# Patient Record
Sex: Male | Born: 1970 | Race: White | Hispanic: No | Marital: Married | Smoking: Current some day smoker
Health system: Southern US, Community
[De-identification: ages and names within clinical notes are randomized; demographics above are authoritative.]

## PROBLEM LIST (undated history)

## (undated) DIAGNOSIS — Z8042 Family history of malignant neoplasm of prostate: Secondary | ICD-10-CM

## (undated) DIAGNOSIS — Z800 Family history of malignant neoplasm of digestive organs: Secondary | ICD-10-CM

## (undated) DIAGNOSIS — Z8051 Family history of malignant neoplasm of kidney: Secondary | ICD-10-CM

## (undated) DIAGNOSIS — Z803 Family history of malignant neoplasm of breast: Secondary | ICD-10-CM

## (undated) DIAGNOSIS — Z8049 Family history of malignant neoplasm of other genital organs: Secondary | ICD-10-CM

## (undated) HISTORY — DX: Family history of malignant neoplasm of breast: Z80.3

## (undated) HISTORY — DX: Family history of malignant neoplasm of other genital organs: Z80.49

## (undated) HISTORY — DX: Family history of malignant neoplasm of prostate: Z80.42

## (undated) HISTORY — DX: Family history of malignant neoplasm of kidney: Z80.51

## (undated) HISTORY — DX: Family history of malignant neoplasm of digestive organs: Z80.0

---

## 2007-08-14 ENCOUNTER — Ambulatory Visit: Payer: Self-pay | Admitting: Family Medicine

## 2007-08-14 DIAGNOSIS — F410 Panic disorder [episodic paroxysmal anxiety] without agoraphobia: Secondary | ICD-10-CM | POA: Insufficient documentation

## 2007-08-14 DIAGNOSIS — Z8719 Personal history of other diseases of the digestive system: Secondary | ICD-10-CM | POA: Insufficient documentation

## 2007-08-14 DIAGNOSIS — F17200 Nicotine dependence, unspecified, uncomplicated: Secondary | ICD-10-CM | POA: Insufficient documentation

## 2007-09-15 ENCOUNTER — Telehealth: Payer: Self-pay | Admitting: Family Medicine

## 2008-09-16 ENCOUNTER — Telehealth: Payer: Self-pay | Admitting: Family Medicine

## 2009-03-06 ENCOUNTER — Emergency Department: Payer: Self-pay | Admitting: Emergency Medicine

## 2010-07-31 NOTE — Assessment & Plan Note (Signed)
Summary: to be established/mhf   Vital Signs:  Patient Profile:   40 Years Old Male Height:     61 inches (154.94 cm) Weight:      237 pounds (107.73 kg) Temp:     98.1 degrees F (36.72 degrees C) oral Pulse rate:   74 / minute BP sitting:   140 / 80  (right arm)  Pt. in pain?   no  Vitals Entered By: Arcola Jansky, RN (August 14, 2007 2:52 PM)              Is Patient Diabetic? No     Chief Complaint:  to be est     ---1)hemmroids 1-93yrs 2) back problems 3) panic attacks.  History of Present Illness: Malik Lynch is a 40 year old, married male self-employed Surveyor, minerals who comes in today as a new patient for evaluation of multiple problems.  Problem number one is hemorrhoids.  Off-and-on for the past two to 3, years.  He's had bright red rectal bleeding.  It's painless and associated with chronic constipation.  He said no, vomiting, diarrhea, abdominal pain, or weight loss.  No family history of colon cancer.  For many, years.  He's had panic attacks.  He has a bad spell.  He only once per year.  Two to 3 times a week.  He has the sense of severe anxiety.  He would like to discuss treatment options.  He said his mother was a nervous person.  He smokes one pack of cigarettes a day.  He started smoking at age 89.  Equipped for one year, but then restarted.  He therefore has a 21 year pack history.  He would like to discuss options of smoking cessation.    Current Allergies (reviewed today): No known allergies   Past Medical History:    Reviewed history and no changes required:       fractured wrist       fractured leg       low back pain       panic attacks       tobacco abuse   Family History:    Reviewed history and no changes required:       father died at 24, COPD smoker, pneumonia, hyperlipidemia, and degenerative disease       mother 11, anxiety       3 brothers in good health  Social History:    Reviewed history and no changes required:       Occupation:  Development worker, community       Married       Current Smoker       Alcohol use-no       Drug use-no       Regular exercise-yes   Risk Factors:  Tobacco use:  current    Cigarettes:  Yes -- 1 pack(s) per day    Counseled to quit/cut down tobacco use:  yes Drug use:  no Alcohol use:  no Exercise:  yes   Review of Systems      See HPI   Physical Exam  General:     Well-developed,well-nourished,in no acute distress; alert,appropriate and cooperative throughout examination Head:     Normocephalic and atraumatic without obvious abnormalities. No apparent alopecia or balding. Eyes:     No corneal or conjunctival inflammation noted. EOMI. Perrla. Funduscopic exam benign, without hemorrhages, exudates or papilledema. Vision grossly normal. Ears:     External ear exam shows no significant lesions or deformities.  Otoscopic examination reveals  clear canals, tympanic membranes are intact bilaterally without bulging, retraction, inflammation or discharge. Hearing is grossly normal bilaterally. Nose:     External nasal examination shows no deformity or inflammation. Nasal mucosa are pink and moist without lesions or exudates. Mouth:     Oral mucosa and oropharynx without lesions or exudates.  Teeth in good repair. Abdomen:     Bowel sounds positive,abdomen soft and non-tender without masses, organomegaly or hernias noted. Rectal:     No external abnormalities noted. Normal sphincter tone. No rectal masses or tenderness. Psych:     Cognition and judgment appear intact. Alert and cooperative with normal attention span and concentration. No apparent delusions, illusions, hallucinations    Impression & Recommendations:  Problem # 1:  RECTAL BLEEDING, HX OF (ICD-V12.79) Assessment: New  Problem # 2:  PANIC DISORDER (ICD-300.01) Assessment: New  His updated medication list for this problem includes:    Celexa 20 Mg Tabs (Citalopram hydrobromide) .Marland Kitchen... 1 tab @ bedtime   Problem # 3:  TOBACCO ABUSE  (ICD-305.1) Assessment: New  His updated medication list for this problem includes:    Chantix Starting Month Pak 0.5 Mg X 11 & 1 Mg X 42 Misc (Varenicline tartrate) ..... Uad   Complete Medication List: 1)  Advil 100 Mg Tabs (Ibuprofen) .... Otc 2)  Adprin B 325 Mg Tabs (Aspirin buf(cacarb-mgcarb-mgo)) .... Otc, as needed 3)  Celexa 20 Mg Tabs (Citalopram hydrobromide) .Marland Kitchen.. 1 tab @ bedtime 4)  Anusol-hc 25 Mg Supp (Hydrocortisone acetate) .Marland Kitchen.. 1 tab @ bedtime 5)  Chantix Starting Month Pak 0.5 Mg X 11 & 1 Mg X 42 Misc (Varenicline tartrate) .... Uad   Patient Instructions: 1)  begin Celexa 20 mg one half tablet at bedtime.  If after a month if still having symptoms increase it to a full tablet. 2)  Drink at least 40 ounces of water a day.  Take a stool softener of your choice and take it on a daily basis.  Soak for 10 minutes at that time, the hot tub.  Insert, a suppository in your rectum.for the next 12 nights.  After that take a stool softener on a regular basis.  If the rectal bleeding recurs let us know.  We will evaluate further. 3)  Begin the smoking cessation program as outlined.  Return for a complete physical examination sometime in the next 4 to 6 weeks 4)  cbc, lipid panel, u/a ,tsh level, liver fcn panel,bmet, and psa prior to next visit (v70.0)     Prescriptions: CHANTIX STARTING MONTH PAK 0.5 MG X 11 & 1 MG X 42  MISC (VARENICLINE TARTRATE) UAD  #1 x 0   Entered and Authorized by:   Roderick Pee MD   Signed by:   Roderick Pee MD on 08/14/2007   Method used:   Print then Give to Patient   RxID:   [id number] ANUSOL-HC 25 MG  SUPP (HYDROCORTISONE ACETATE) 1 tab @ bedtime  #12 x 2   Entered and Authorized by:   Roderick Pee MD   Signed by:   Roderick Pee MD on 08/14/2007   Method used:   Print then Give to Patient   RxID:   [id number] CELEXA 20 MG  TABS (CITALOPRAM HYDROBROMIDE) 1 tab @ bedtime  #100 x 4   Entered and Authorized by:   Roderick Pee  MD   Signed by:   Roderick Pee MD on 08/14/2007   Method used:   Print then  Give to Patient   RxID:   [id number]  ]

## 2010-07-31 NOTE — Progress Notes (Signed)
Summary: cialopram refill-no more until office visit  Phone Note From Pharmacy   Details for Reason: refill Summary of Call: pharm is calling because the patient would like a refill of citalopram.  last office visit was 2/09 as a new patient.  is this okay to fill? Initial call taken by: Kern Reap CMA,  September 16, 2008 12:20 PM  Follow-up for Phone Call        given one-month supply having no refills.  Have him come in sometime in the next 4 weeks for an office visit Follow-up by: Roderick Pee MD,  September 16, 2008 1:50 PM  Additional Follow-up for Phone Call Additional follow up Details #1::        Prescription resent Additional Follow-up by: Kern Reap CMA,  September 16, 2008 4:44 PM

## 2010-07-31 NOTE — Progress Notes (Signed)
Summary: anxiety   Phone Note Call from Patient Call back at [phone] ext [phone]   Caller: patient wife Call For: todd Summary of Call: was given celexa for anxiety He is having ups and downs.  When he becomes angry he cant calm down and when he does he feels drained.  He is worried he is bipolar.  Is there another med he can try  Initial call taken by: Roselle Locus,  September 15, 2007 1:15 PM  Follow-up for Phone Call        10:00 Fri Dr. Tawanna Cooler.  Wife aware. Follow-up by: Rudy Jew, RN,  September 15, 2007 1:30 PM

## 2010-08-04 ENCOUNTER — Emergency Department (HOSPITAL_COMMUNITY)
Admission: EM | Admit: 2010-08-04 | Discharge: 2010-08-04 | Disposition: A | Discharge status: Home / Self Care | Payer: Self-pay | Attending: Emergency Medicine | Admitting: Emergency Medicine

## 2010-08-04 DIAGNOSIS — M545 Low back pain, unspecified: Secondary | ICD-10-CM | POA: Insufficient documentation

## 2010-08-04 DIAGNOSIS — M549 Dorsalgia, unspecified: Secondary | ICD-10-CM | POA: Insufficient documentation

## 2010-08-08 ENCOUNTER — Encounter: Payer: Self-pay | Admitting: Family Medicine

## 2010-08-08 ENCOUNTER — Ambulatory Visit (INDEPENDENT_AMBULATORY_CARE_PROVIDER_SITE_OTHER): Payer: Self-pay | Admitting: Family Medicine

## 2010-08-08 DIAGNOSIS — M545 Low back pain, unspecified: Secondary | ICD-10-CM

## 2010-08-08 DIAGNOSIS — M5136 Other intervertebral disc degeneration, lumbar region: Secondary | ICD-10-CM | POA: Insufficient documentation

## 2010-08-08 DIAGNOSIS — F17200 Nicotine dependence, unspecified, uncomplicated: Secondary | ICD-10-CM

## 2010-08-08 DIAGNOSIS — Z0000 Encounter for general adult medical examination without abnormal findings: Secondary | ICD-10-CM

## 2010-08-08 LAB — POCT URINALYSIS DIPSTICK
Bilirubin, UA: NEGATIVE
Blood, UA: NEGATIVE
Glucose, UA: NEGATIVE
Ketones, UA: NEGATIVE
Leukocytes, UA: NEGATIVE
Nitrite, UA: NEGATIVE
Protein, UA: NEGATIVE
Spec Grav, UA: 1.015
Urobilinogen, UA: 0.2
pH, UA: 7

## 2010-08-08 LAB — CBC WITH DIFFERENTIAL/PLATELET
Basophils Absolute: 0 10*3/uL (ref 0.0–0.1)
Basophils Relative: 0.4 % (ref 0.0–3.0)
Eosinophils Absolute: 0 10*3/uL (ref 0.0–0.7)
Eosinophils Relative: 0.1 % (ref 0.0–5.0)
HCT: 41 % (ref 39.0–52.0)
Hemoglobin: 14.3 g/dL (ref 13.0–17.0)
Lymphocytes Relative: 21.6 % (ref 12.0–46.0)
Lymphs Abs: 1.6 10*3/uL (ref 0.7–4.0)
MCHC: 34.7 g/dL (ref 30.0–36.0)
MCV: 96.1 fl (ref 78.0–100.0)
Monocytes Absolute: 0.2 10*3/uL (ref 0.1–1.0)
Monocytes Relative: 2.9 % — ABNORMAL LOW (ref 3.0–12.0)
Neutro Abs: 5.4 10*3/uL (ref 1.4–7.7)
Neutrophils Relative %: 75 % (ref 43.0–77.0)
Platelets: 250 10*3/uL (ref 150.0–400.0)
RBC: 4.27 Mil/uL (ref 4.22–5.81)
RDW: 13.2 % (ref 11.5–14.6)
WBC: 7.2 10*3/uL (ref 4.5–10.5)

## 2010-08-08 LAB — LIPID PANEL
Cholesterol: 147 mg/dL (ref 0–200)
HDL: 38 mg/dL — ABNORMAL LOW (ref >39.00)
LDL Cholesterol: 73 mg/dL (ref 0–99)
Total CHOL/HDL Ratio: 4
Triglycerides: 181 mg/dL — ABNORMAL HIGH (ref 0.0–149.0)
VLDL: 36.2 mg/dL (ref 0.0–40.0)

## 2010-08-08 LAB — BASIC METABOLIC PANEL
BUN: 11 mg/dL (ref 6–23)
CO2: 29 mEq/L (ref 19–32)
Calcium: 9.7 mg/dL (ref 8.4–10.5)
Chloride: 104 mEq/L (ref 96–112)
Creatinine, Ser: 0.9 mg/dL (ref 0.4–1.5)
GFR: 94.79 mL/min (ref >60.00)
Glucose, Bld: 102 mg/dL — ABNORMAL HIGH (ref 70–99)
Potassium: 4.9 mEq/L (ref 3.5–5.1)
Sodium: 139 mEq/L (ref 135–145)

## 2010-08-08 LAB — HEPATIC FUNCTION PANEL
ALT: 24 U/L (ref 0–53)
AST: 25 U/L (ref 0–37)
Albumin: 4.5 g/dL (ref 3.5–5.2)
Alkaline Phosphatase: 58 U/L (ref 39–117)
Bilirubin, Direct: 0.1 mg/dL (ref 0.0–0.3)
Total Bilirubin: 0.2 mg/dL — ABNORMAL LOW (ref 0.3–1.2)
Total Protein: 7.2 g/dL (ref 6.0–8.3)

## 2010-08-08 LAB — TSH: TSH: 0.43 u[IU]/mL (ref 0.35–5.50)

## 2010-08-08 MED ORDER — CYCLOBENZAPRINE HCL 10 MG PO TABS: 10.0000 mg | ORAL_TABLET | Freq: Three times a day (TID) | ORAL | Status: DC | PRN

## 2010-08-08 MED ORDER — HYDROCODONE-ACETAMINOPHEN 5-500 MG PO TABS: 1.0000 | ORAL_TABLET | Freq: Four times a day (QID) | ORAL | Status: DC | PRN

## 2010-08-08 NOTE — Progress Notes (Signed)
Addended by: Rossie Muskrat on: 08/08/2010 10:36 AM   Modules accepted: Orders

## 2010-08-08 NOTE — Progress Notes (Signed)
  Subjective:    Patient ID: Malik Lynch, male    DOB: [birth year], 40 y.o.   MRN: [id number]  HPI Malik Lynch is40 -year-old married male Smoker, self-employed Holiday representative, who comes in today for a physical examination and to talk about back pain.  He began having low back pain about 16 years ago.  It flares up every now and then.  On January the 29th, which was a Saturday.  He was doing some lifting at home and later on that day.  He noticed severe back pain.  [facility] and was given some medication however, by last Thursday.  The pain was worse.  On Saturday he went to the emergency room.  At that time.  His pain medicine was renewed.  He was given Flexeril and also prednisone.  He comes in today stating he is not a whole lot better.  He describes the pain as sharp, intermittent, if he sitting still.  The pain is very quiet.  It's a 6 to 8 on a scale of one to 10 it radiates down his left leg to the back of his knee.  He has no neurologic symptoms.  Specifically, no numbness no weakness.  No bowel or bladder problems.  He is also a smoker.  I gave him a prescription for the chantix is not purchased it yet.   Review of Systems    Total review of systems other than above, negative Objective:   Physical Exam He is a well-developed, well-nourished, male in no acute distress.  Examination of the head, eyes, ears, nose, and throat were negative.  Neck was supple.  Thyroid is not enlarged.  No adenopathy.  Chest was clear to auscultation.  Cardiac exam normal.  Abdominal exam normal.  Genitalia normal male.  Extremities normal.  Skin normal.  Peripheral pulses normal.  Neurologic examination the legs were both of the legs.  Sensation muscle strength reflexes, normal.  Positive straight leg raising left leg 30 degrees.       Assessment & Plan:  Healthy male.  Tobacco abuse.  Lumbar disk disease.  Plan stay at complete bedrest today, Thursday, Friday,  begin PT, Friday.  Follow-up on Monday.  Motrin 800 mg twice daily, Flexeril, and Vicodin 3 times daily as needed.  Would also recommend he stop smoking completely and begin the chantix program

## 2010-08-08 NOTE — Patient Instructions (Signed)
Stop the prednisone.  Motrin 800 mg twice daily with food.  Complete bed rest today, Thursday, Friday, starting on Saturday, walk,,,,,,,, lie down,,,,,,,,,, walk,,,,,, lie down.  No sitting.  Return on Monday for recheck.  Physical therapy starting Friday.  Flexeril and pain pills one half or full tablet of each 3 times a day as needed.  Also begin the chantix half a tablet daily and stop smoking completely

## 2010-08-09 ENCOUNTER — Encounter: Payer: Self-pay | Admitting: Family Medicine

## 2010-08-13 ENCOUNTER — Ambulatory Visit: Payer: Self-pay | Admitting: Family Medicine

## 2010-08-13 DIAGNOSIS — Z0289 Encounter for other administrative examinations: Secondary | ICD-10-CM

## 2010-08-13 NOTE — Progress Notes (Signed)
patient  Is aware 

## 2010-08-16 ENCOUNTER — Other Ambulatory Visit: Payer: Self-pay | Admitting: Family Medicine

## 2010-08-16 DIAGNOSIS — M549 Dorsalgia, unspecified: Secondary | ICD-10-CM

## 2010-08-16 NOTE — Telephone Encounter (Signed)
Okay to refill medications number 30 directions one half tab 3 times a day p.r.n. For severe pain, refills x 1 and since the pain is persistent.  He needs to go to physical therapy.  Please send a note to terri  to set this up ASAP

## 2010-08-17 ENCOUNTER — Other Ambulatory Visit: Payer: Self-pay | Admitting: Family Medicine

## 2010-08-17 DIAGNOSIS — M549 Dorsalgia, unspecified: Secondary | ICD-10-CM

## 2010-08-23 ENCOUNTER — Other Ambulatory Visit: Payer: Self-pay | Admitting: Family Medicine

## 2010-08-24 ENCOUNTER — Telehealth: Payer: Self-pay | Admitting: Family Medicine

## 2010-08-24 NOTE — Telephone Encounter (Signed)
Pt is completely out of Flexeril/Hydrocodone. Pls be sure that this gets called in today.

## 2010-08-24 NOTE — Telephone Encounter (Signed)
Refill on :  Flexeril / Hydrocodone..... [facility] [place] [address].... Pt # [phone].

## 2010-08-25 ENCOUNTER — Emergency Department: Payer: Self-pay | Admitting: Internal Medicine

## 2010-09-07 NOTE — Telephone Encounter (Signed)
OK to RF

## 2010-09-09 ENCOUNTER — Other Ambulatory Visit: Payer: Self-pay | Admitting: Family Medicine

## 2010-09-10 MED ORDER — HYDROCODONE-ACETAMINOPHEN 5-500 MG PO TABS: 1.0000 | ORAL_TABLET | Freq: Four times a day (QID) | ORAL | Status: DC | PRN

## 2010-09-10 NOTE — Telephone Encounter (Signed)
Is this okay to fill? 

## 2010-09-10 NOTE — Telephone Encounter (Signed)
Refill both medications, however, set him up for an office visit next week for evaluation

## 2010-09-10 NOTE — Telephone Encounter (Signed)
Called in.

## 2010-09-26 ENCOUNTER — Other Ambulatory Visit: Payer: Self-pay | Admitting: Family Medicine

## 2010-09-27 NOTE — Telephone Encounter (Signed)
rx denied . patient  Will need an office visit

## 2020-04-21 ENCOUNTER — Ambulatory Visit
Admission: RE | Admit: 2020-04-21 | Discharge: 2020-04-21 | Disposition: A | Discharge status: Home / Self Care | Payer: No Typology Code available for payment source | Source: Ambulatory Visit | Attending: Family Medicine | Admitting: Family Medicine

## 2020-04-21 ENCOUNTER — Other Ambulatory Visit: Payer: Self-pay | Admitting: Family Medicine

## 2020-04-21 DIAGNOSIS — R634 Abnormal weight loss: Secondary | ICD-10-CM

## 2020-05-01 ENCOUNTER — Other Ambulatory Visit: Payer: Self-pay | Admitting: Physician Assistant

## 2020-05-01 DIAGNOSIS — R1904 Left lower quadrant abdominal swelling, mass and lump: Secondary | ICD-10-CM

## 2020-05-11 ENCOUNTER — Ambulatory Visit
Admission: RE | Admit: 2020-05-11 | Discharge: 2020-05-11 | Disposition: A | Discharge status: Home / Self Care | Payer: No Typology Code available for payment source | Source: Ambulatory Visit | Attending: Physician Assistant | Admitting: Physician Assistant

## 2020-05-11 DIAGNOSIS — R1904 Left lower quadrant abdominal swelling, mass and lump: Secondary | ICD-10-CM

## 2020-05-11 MED ORDER — IOPAMIDOL (ISOVUE-300) INJECTION 61%
100.0000 mL | Freq: Once | INTRAVENOUS | Status: AC | PRN
  Administered 2020-05-11: 100 mL via INTRAVENOUS

## 2020-05-23 ENCOUNTER — Other Ambulatory Visit: Payer: Self-pay | Admitting: Oncology

## 2020-05-31 ENCOUNTER — Other Ambulatory Visit (HOSPITAL_COMMUNITY): Payer: Self-pay | Admitting: Gastroenterology

## 2020-05-31 ENCOUNTER — Other Ambulatory Visit: Payer: Self-pay | Admitting: Gastroenterology

## 2020-05-31 DIAGNOSIS — K6389 Other specified diseases of intestine: Secondary | ICD-10-CM

## 2020-06-06 ENCOUNTER — Ambulatory Visit (HOSPITAL_COMMUNITY)
Admission: RE | Admit: 2020-06-06 | Discharge: 2020-06-06 | Disposition: A | Discharge status: Home / Self Care | Payer: Self-pay | Source: Ambulatory Visit | Attending: Gastroenterology | Admitting: Gastroenterology

## 2020-06-06 ENCOUNTER — Other Ambulatory Visit: Payer: Self-pay

## 2020-06-06 DIAGNOSIS — K6389 Other specified diseases of intestine: Secondary | ICD-10-CM | POA: Insufficient documentation

## 2020-06-06 MED ORDER — IOHEXOL 300 MG/ML  SOLN
80.0000 mL | Freq: Once | INTRAMUSCULAR | Status: AC | PRN
  Administered 2020-06-06: 80 mL via INTRAVENOUS

## 2020-06-19 ENCOUNTER — Telehealth: Payer: Self-pay

## 2020-06-19 NOTE — Telephone Encounter (Signed)
Referral received as transfer from [facility], since Mr. Malik Lynch lives in [place], for new diagnosis of rectal cancer. He has had imaging, EUS, and surgical referral with Dr. Marcello Moores completed. He is having issues with bowel movements, urination, and pain. Dr. Grayland Ormond can see him for medical oncology tomorrow at 1300. Radiation oncology appointment pending. Would benefit from palliative care and dietician referral. He has lost 50 pounds in 3 months. He has also seen Dr. Wynona Neat Winter for left renal mass. It sounds like a nephrectomy was recommended at the time of rectal surgery. I requested note from [facility], however, he will need to sign a release for [place] to obtain the note. We will obtain this at his appointment tomorrow. Went over this information with his spouse, Malik Lynch. Directions to the cancer center given.

## 2020-06-19 NOTE — Telephone Encounter (Signed)
Faxed entire referral including Dr. Manon Hilding office note from [facility] to [place] [facility] at [place] to schedule patient.  Received confirmation fax went through (faxed to [phone])

## 2020-06-19 NOTE — Telephone Encounter (Signed)
Spoke with patient regarding referral we received from [facility].  He was given the option to be seen here in [place] or at oncology at [facility].  He prefers to be seen at [facility].

## 2020-06-20 ENCOUNTER — Inpatient Hospital Stay: Payer: Self-pay | Attending: Oncology | Admitting: Oncology

## 2020-06-20 ENCOUNTER — Inpatient Hospital Stay: Payer: Self-pay

## 2020-06-20 ENCOUNTER — Encounter: Payer: Self-pay | Admitting: Oncology

## 2020-06-20 ENCOUNTER — Other Ambulatory Visit: Payer: Self-pay

## 2020-06-20 VITALS — BP 134/87 | HR 81 | Temp 97.8°F | Resp 20 | Wt 192.0 lb

## 2020-06-20 DIAGNOSIS — R634 Abnormal weight loss: Secondary | ICD-10-CM

## 2020-06-20 DIAGNOSIS — F17210 Nicotine dependence, cigarettes, uncomplicated: Secondary | ICD-10-CM

## 2020-06-20 DIAGNOSIS — C20 Malignant neoplasm of rectum: Secondary | ICD-10-CM

## 2020-06-20 DIAGNOSIS — G8929 Other chronic pain: Secondary | ICD-10-CM

## 2020-06-20 LAB — CBC WITH DIFFERENTIAL/PLATELET
Abs Immature Granulocytes: 0.01 10*3/uL (ref 0.00–0.07)
Basophils Absolute: 0.1 10*3/uL (ref 0.0–0.1)
Basophils Relative: 1 %
Eosinophils Absolute: 0.4 10*3/uL (ref 0.0–0.5)
Eosinophils Relative: 4 %
HCT: 42.9 % (ref 39.0–52.0)
Hemoglobin: 14.8 g/dL (ref 13.0–17.0)
Immature Granulocytes: 0 %
Lymphocytes Relative: 36 %
Lymphs Abs: 3.3 10*3/uL (ref 0.7–4.0)
MCH: 33.5 pg (ref 26.0–34.0)
MCHC: 34.5 g/dL (ref 30.0–36.0)
MCV: 97.1 fL (ref 80.0–100.0)
Monocytes Absolute: 0.5 10*3/uL (ref 0.1–1.0)
Monocytes Relative: 6 %
Neutro Abs: 5 10*3/uL (ref 1.7–7.7)
Neutrophils Relative %: 53 %
Platelets: 205 10*3/uL (ref 150–400)
RBC: 4.42 MIL/uL (ref 4.22–5.81)
RDW: 12.4 % (ref 11.5–15.5)
WBC: 9.3 10*3/uL (ref 4.0–10.5)
nRBC: 0 % (ref 0.0–0.2)

## 2020-06-20 LAB — COMPREHENSIVE METABOLIC PANEL
ALT: 16 U/L (ref 0–44)
AST: 18 U/L (ref 15–41)
Albumin: 4.5 g/dL (ref 3.5–5.0)
Alkaline Phosphatase: 66 U/L (ref 38–126)
Anion gap: 9 (ref 5–15)
BUN: 12 mg/dL (ref 6–20)
CO2: 26 mmol/L (ref 22–32)
Calcium: 10 mg/dL (ref 8.9–10.3)
Chloride: 100 mmol/L (ref 98–111)
Creatinine, Ser: 0.92 mg/dL (ref 0.61–1.24)
GFR, Estimated: >60 mL/min (ref >60)
Glucose, Bld: 88 mg/dL (ref 70–99)
Potassium: 4.6 mmol/L (ref 3.5–5.1)
Sodium: 135 mmol/L (ref 135–145)
Total Bilirubin: 0.5 mg/dL (ref 0.3–1.2)
Total Protein: 7.6 g/dL (ref 6.5–8.1)

## 2020-06-20 LAB — IRON AND TIBC
Iron: 120 ug/dL (ref 45–182)
Saturation Ratios: 30 % (ref 17.9–39.5)
TIBC: 406 ug/dL (ref 250–450)
UIBC: 286 ug/dL

## 2020-06-20 LAB — FERRITIN: Ferritin: 260 ng/mL (ref 24–336)

## 2020-06-21 ENCOUNTER — Other Ambulatory Visit: Payer: Self-pay | Admitting: Oncology

## 2020-06-21 LAB — CEA: CEA: 9.1 ng/mL — ABNORMAL HIGH (ref 0.0–4.7)

## 2020-06-21 MED ORDER — OXYCODONE-ACETAMINOPHEN 5-325 MG PO TABS: 1.0000 | ORAL_TABLET | Freq: Four times a day (QID) | ORAL | 0 refills | Status: DC | PRN

## 2020-06-22 ENCOUNTER — Telehealth: Payer: Self-pay | Admitting: Pharmacy Technician

## 2020-06-22 ENCOUNTER — Telehealth: Payer: Self-pay | Admitting: Pharmacist

## 2020-06-22 DIAGNOSIS — C20 Malignant neoplasm of rectum: Secondary | ICD-10-CM | POA: Insufficient documentation

## 2020-06-22 NOTE — Telephone Encounter (Signed)
Oral Oncology Pharmacist Encounter  Received new prescription for Xeloda (capecitaine) for the neoadjuvant treatment of stage IIIb rectal cancer in conjunction with XRT, planned duration until the end of radiation treatment.  CMP from 06/20/20 assessed, no relevant lab abnormalities. Prescription dose and frequency assessed.   Current medication list in Epic reviewed, no DDIs with capecitabine identified.  Evaluated chart and no patient barriers to medication adherence identified.   Patient is uninsured, Bethena Roys will reach out to patient to see if he can afford the $45 discounted price at the pharmacy. If not, will proceed with manufacturer assistance.  Oral Oncology Clinic will continue to follow initial counseling and start date.  Darl Pikes, PharmD, BCPS, BCOP, CPP Hematology/Oncology Clinical Pharmacist Practitioner [facility]/HP/AP Oral [facility] [phone]  06/22/2020 9:45 AM

## 2020-06-22 NOTE — Telephone Encounter (Signed)
Oral Oncology Patient Advocate Encounter  Patient is uninsured.  No PA needed for Xeloda.   Patient's copay is $45.00 at [facility]  Spoke to patient and he is able to afford the copay for the Xeloda.  Will call and schedule shipment closer to start date.  [place] Patient [place] Phone [phone] Fax [phone] 06/22/2020 11:10 AM

## 2020-06-22 NOTE — Progress Notes (Signed)
Malik Lynch  Telephone:([phone]) [phone] Fax:(336) [phone]  ID: Malik Lynch OB: October 01, 1970  MR#: [id number]  [id number]  Patient Care Team: Dorena Cookey, MD (Inactive) as PCP - General [name], RN as Oncology Nurse Navigator  CHIEF COMPLAINT: Stage IIIb rectal cancer.  INTERVAL HISTORY: Patient is a 49 year old male who had a recent history of abdominal/pelvic pain along with difficulty urinating.  Subsequent work-up and biopsy revealed the above-stated rectal cancer.  He continues to have significant pain, but otherwise feels well.  He does not complain of any melena or hematochezia.  He has no neurologic complaints.  He denies any recent fevers or illnesses.  He has a fair appetite and reports a significant amount of unintentional weight loss.  He has no chest pain, shortness of breath, cough, or hemoptysis.  He denies any nausea, vomiting, constipation, or diarrhea.  He has no other urinary complaints.  Patient offers no further specific complaints today.  REVIEW OF SYSTEMS:   Review of Systems  Constitutional: Positive for malaise/fatigue and weight loss. Negative for fever.  Respiratory: Negative.  Negative for cough, hemoptysis and shortness of breath.   Cardiovascular: Negative.  Negative for chest pain and leg swelling.  Gastrointestinal: Positive for abdominal pain. Negative for blood in stool and melena.  Genitourinary: Positive for urgency.  Musculoskeletal: Negative.  Negative for back pain.  Skin: Negative.  Negative for rash.  Neurological: Positive for weakness. Negative for dizziness, focal weakness and headaches.  Psychiatric/Behavioral: Negative.  The patient is not nervous/anxious.     As per HPI. Otherwise, a complete review of systems is negative.  PAST MEDICAL HISTORY: History reviewed. No pertinent past medical history.  PAST SURGICAL HISTORY: History reviewed. No pertinent surgical history.  FAMILY HISTORY: Family  History  Problem Relation Age of Onset  . Cancer Paternal Grandfather     ADVANCED DIRECTIVES (Y/N):  N  HEALTH MAINTENANCE: Social History   Tobacco Use  . Smoking status: Current Some Day Smoker    Packs/day: 1.00    Types: Cigarettes  Vaping Use  . Vaping Use: Never used  Substance Use Topics  . Alcohol use: Yes    Alcohol/week: 1.0 standard drink    Types: 1 Cans of beer per week  . Drug use: No     Colonoscopy:  PAP:  Bone density:  Lipid panel:  No Known Allergies  Current Outpatient Medications  Medication Sig Dispense Refill  . ibuprofen (ADVIL,MOTRIN) 100 MG tablet Take 800 mg by mouth every 6 (six) hours as needed.    . tamsulosin (FLOMAX) 0.4 MG CAPS capsule Take 0.4 mg by mouth daily.    . traZODone (DESYREL) 150 MG tablet Take 150 mg by mouth at bedtime.    Marland Kitchen oxyCODONE-acetaminophen (PERCOCET/ROXICET) 5-325 MG tablet Take 1 tablet by mouth every 6 (six) hours as needed for severe pain. 30 tablet 0   No current facility-administered medications for this visit.    OBJECTIVE: Vitals:   06/20/20 1316  BP: 134/87  Pulse: 81  Resp: 20  Temp: 97.8 F (36.6 C)  SpO2: 100%     Body mass index is 26.22 kg/m.    ECOG FS:0 - Asymptomatic  General: Well-developed, well-nourished, no acute distress. Eyes: Pink conjunctiva, anicteric sclera. HEENT: Normocephalic, moist mucous membranes. Lungs: No audible wheezing or coughing. Heart: Regular rate and rhythm. Abdomen: Soft, nontender, no obvious distention. Musculoskeletal: No edema, cyanosis, or clubbing. Neuro: Alert, answering all questions appropriately. Cranial nerves grossly intact. Skin: No rashes  or petechiae noted. Psych: Normal affect. Lymphatics: No cervical, calvicular, axillary or inguinal LAD.   LAB RESULTS:  Lab Results  Component Value Date   NA 135 06/20/2020   K 4.6 06/20/2020   CL 100 06/20/2020   CO2 26 06/20/2020   GLUCOSE 88 06/20/2020   BUN 12 06/20/2020   CREATININE 0.92  06/20/2020   CALCIUM 10.0 06/20/2020   PROT 7.6 06/20/2020   ALBUMIN 4.5 06/20/2020   AST 18 06/20/2020   ALT 16 06/20/2020   ALKPHOS 66 06/20/2020   BILITOT 0.5 06/20/2020   GFRNONAA >60 06/20/2020    Lab Results  Component Value Date   WBC 9.3 06/20/2020   NEUTROABS 5.0 06/20/2020   HGB 14.8 06/20/2020   HCT 42.9 06/20/2020   MCV 97.1 06/20/2020   PLT 205 06/20/2020     STUDIES: CT CHEST W CONTRAST  Result Date: 06/07/2020 CLINICAL DATA:  Left renal lesion and rectal mass suspected on prior abdominal CT scan. Evaluate for metastatic disease. EXAM: CT CHEST WITH CONTRAST TECHNIQUE: Multidetector CT imaging of the chest was performed during intravenous contrast administration. CONTRAST:  77mL OMNIPAQUE IOHEXOL 300 MG/ML  SOLN COMPARISON:  CT abdomen/pelvis 05/11/2020 FINDINGS: Cardiovascular: The heart is normal in size. No pericardial effusion. The aorta is normal in caliber. No dissection. No atherosclerotic calcifications. Branch vessels are patent. Scattered coronary artery calcifications are noted. Mediastinum/Nodes: Borderline enlarged right hilar lymph nodes largest measuring 12.5 mm on image number 72/3. Scattered sub 8 mm mediastinal lymph nodes. The esophagus is grossly normal. The thyroid gland is unremarkable. Lungs/Pleura: Mild emphysematous changes are noted. No acute pulmonary findings or worrisome pulmonary lesions. No pulmonary nodules to suggest pulmonary metastatic disease. No pleural effusion or pleural nodules. No bronchiectasis or interstitial lung disease. Upper Abdomen: No significant upper abdominal findings. No worrisome hepatic lesions. Musculoskeletal: No significant bony findings. IMPRESSION: 1. No CT findings for pulmonary metastatic disease. 2. Borderline mediastinal and hilar nodes, likely reactive and due to the patients emphysema. 3. Mild emphysematous changes. 4. Age advanced coronary artery calcifications. 5. Emphysema and aortic atherosclerosis. Aortic  Atherosclerosis (ICD10-I70.0) and Emphysema (ICD10-J43.9). Electronically Signed   By: Rudie Meyer M.D.   On: 06/07/2020 08:36    ASSESSMENT: Stage IIIb rectal cancer.  PLAN:    1.  Stage IIIb rectal cancer: Pathology and imaging results reviewed independently.  EUS completed at outside facility confirmed stage of disease.  Will get a PET scan to complete the staging work-up.  CEA is only mildly elevated at 9.1.  Patient will benefit from neoadjuvant chemotherapy using capecitabine along with daily XRT.  He has an appointment with radiation oncology next week.  I have also given referrals to palliative care, genetics, and dietary.  Return to clinic next week for further evaluation and treatment planning. 2.  Weight loss: Referral to dietary as above. 3.  Pain: Patient was given a prescription for Percocet today.  Palliative care referral as above.   I spent a total of 60 minutes reviewing chart data, face-to-face evaluation with the patient, counseling and coordination of care as detailed above.   Patient expressed understanding and was in agreement with this plan. He also understands that He can call clinic at any time with any questions, concerns, or complaints.   Cancer Staging Rectal cancer [facility]) Staging form: Colon and Rectum, AJCC 8th Edition - Clinical stage from 06/22/2020: Stage IIIB (cT3, cN1a, cM0) - Signed by Jeralyn Ruths, MD on 06/22/2020   Jeralyn Ruths, MD  06/22/2020 9:20 AM

## 2020-06-22 NOTE — Progress Notes (Signed)
START ON PATHWAY REGIMEN - Colorectal     Administer Monday through Friday:     Capecitabine   **Always confirm dose/schedule in your pharmacy ordering system**  Patient Characteristics: Preoperative or Nonsurgical Candidate (Clinical Staging), Rectal, cT3 - cT4, cN0 or Any cT, cN+ Tumor Location: Rectal Therapeutic Status: Preoperative or Nonsurgical Candidate (Clinical Staging) AJCC T Category: cT3 AJCC N Category: cN1a AJCC M Category: cM0 AJCC 8 Stage Grouping: IIIB Intent of Therapy: Curative Intent, Not Discussed with Patient

## 2020-06-25 NOTE — Progress Notes (Signed)
[place]  [phone] [phone]  ID: Malik Lynch OB: March 18, 1971  MR#: [id number]  TKZ#:[id number]  Patient Care Team: Leonard Downing, MD as PCP - General (Family Medicine) [name], RN as Oncology Nurse Navigator  CHIEF COMPLAINT: Stage IIIb rectal cancer.  INTERVAL HISTORY: Patient returns to clinic today for further evaluation and treatment planning.  He continues to have pain, but admits it is improved with Percocet.  He continues to have difficulty urinating.  He otherwise feels well.  He does not complain of any melena or hematochezia.  He has no neurologic complaints.  He denies any recent fevers or illnesses.  He has a fair appetite.  He has no chest pain, shortness of breath, cough, or hemoptysis.  He denies any nausea, vomiting, constipation, or diarrhea.  Patient offers no further specific complaints today.  REVIEW OF SYSTEMS:   Review of Systems  Constitutional: Positive for malaise/fatigue and weight loss. Negative for fever.  Respiratory: Negative.  Negative for cough, hemoptysis and shortness of breath.   Cardiovascular: Negative.  Negative for chest pain and leg swelling.  Gastrointestinal: Positive for abdominal pain. Negative for blood in stool and melena.  Genitourinary: Positive for urgency.  Musculoskeletal: Negative.  Negative for back pain.  Skin: Negative.  Negative for rash.  Neurological: Positive for weakness. Negative for dizziness, focal weakness and headaches.  Psychiatric/Behavioral: Negative.  The patient is not nervous/anxious.     As per HPI. Otherwise, a complete review of systems is negative.  PAST MEDICAL HISTORY: History reviewed. No pertinent past medical history.  PAST SURGICAL HISTORY: History reviewed. No pertinent surgical history.  FAMILY HISTORY: Family History  Problem Relation Age of Onset  . Cancer Paternal Grandfather     ADVANCED DIRECTIVES (Y/N):  N  HEALTH  MAINTENANCE: Social History   Tobacco Use  . Smoking status: Current Some Day Smoker    Packs/day: 1.00    Types: Cigarettes  Vaping Use  . Vaping Use: Never used  Substance Use Topics  . Alcohol use: Yes    Alcohol/week: 1.0 standard drink    Types: 1 Cans of beer per week  . Drug use: No     Colonoscopy:  PAP:  Bone density:  Lipid panel:  No Known Allergies  Current Outpatient Medications  Medication Sig Dispense Refill  . ibuprofen (ADVIL,MOTRIN) 100 MG tablet Take 800 mg by mouth every 6 (six) hours as needed.    Marland Kitchen oxyCODONE-acetaminophen (PERCOCET/ROXICET) 5-325 MG tablet Take 1 tablet by mouth every 6 (six) hours as needed for severe pain. 30 tablet 0  . polyethylene glycol (MIRALAX / GLYCOLAX) 17 g packet Take 17 g by mouth daily.    . tamsulosin (FLOMAX) 0.4 MG CAPS capsule Take 0.4 mg by mouth daily.    Marland Kitchen ALPRAZolam (XANAX) 0.5 MG tablet Take 1 tablet (0.5 mg total) by mouth 2 (two) times daily as needed for anxiety. 60 tablet 0  . capecitabine (XELODA) 150 MG tablet Take 1 tablet (150 mg total) by mouth 2 (two) times daily after a meal. Take Monday through Friday during radiation. Take only on days of radiation. 60 tablet 0  . capecitabine (XELODA) 500 MG tablet Take 3 tablets (1,500 mg total) by mouth 2 (two) times daily after a meal. Take Monday through Friday during radiation. Take only on days of radiation. 180 tablet 0  . ondansetron (ZOFRAN) 8 MG tablet Take 1 tablet (8 mg total) by mouth every 8 (eight) hours as needed  for nausea or vomiting. 45 tablet 0  . ondansetron (ZOFRAN) 8 MG tablet Take 1 tablet (8 mg total) by mouth 2 (two) times daily as needed (Nausea or vomiting). 60 tablet 1  . prochlorperazine (COMPAZINE) 10 MG tablet Take 1 tablet (10 mg total) by mouth every 6 (six) hours as needed (Nausea or vomiting). 60 tablet 1  . traZODone (DESYREL) 150 MG tablet Take 150 mg by mouth at bedtime. (Patient not taking: Reported on 06/29/2020)     No current  facility-administered medications for this visit.    OBJECTIVE: Vitals:   06/29/20 0941  BP: 131/78  Pulse: 86  Temp: 98.4 F (36.9 C)  SpO2: 100%     Body mass index is 25.03 kg/m.    ECOG FS:0 - Asymptomatic  General: Well-developed, well-nourished, no acute distress. Eyes: Pink conjunctiva, anicteric sclera. HEENT: Normocephalic, moist mucous membranes. Lungs: No audible wheezing or coughing. Heart: Regular rate and rhythm. Abdomen: Soft, nontender, no obvious distention. Musculoskeletal: No edema, cyanosis, or clubbing. Neuro: Alert, answering all questions appropriately. Cranial nerves grossly intact. Skin: No rashes or petechiae noted. Psych: Normal affect.  LAB RESULTS:  Lab Results  Component Value Date   NA 135 06/20/2020   K 4.6 06/20/2020   CL 100 06/20/2020   CO2 26 06/20/2020   GLUCOSE 88 06/20/2020   BUN 12 06/20/2020   CREATININE 0.92 06/20/2020   CALCIUM 10.0 06/20/2020   PROT 7.6 06/20/2020   ALBUMIN 4.5 06/20/2020   AST 18 06/20/2020   ALT 16 06/20/2020   ALKPHOS 66 06/20/2020   BILITOT 0.5 06/20/2020   GFRNONAA >60 06/20/2020    Lab Results  Component Value Date   WBC 9.3 06/20/2020   NEUTROABS 5.0 06/20/2020   HGB 14.8 06/20/2020   HCT 42.9 06/20/2020   MCV 97.1 06/20/2020   PLT 205 06/20/2020     STUDIES: CT CHEST W CONTRAST  Result Date: 06/07/2020 CLINICAL DATA:  Left renal lesion and rectal mass suspected on prior abdominal CT scan. Evaluate for metastatic disease. EXAM: CT CHEST WITH CONTRAST TECHNIQUE: Multidetector CT imaging of the chest was performed during intravenous contrast administration. CONTRAST:  35mL OMNIPAQUE IOHEXOL 300 MG/ML  SOLN COMPARISON:  CT abdomen/pelvis 05/11/2020 FINDINGS: Cardiovascular: The heart is normal in size. No pericardial effusion. The aorta is normal in caliber. No dissection. No atherosclerotic calcifications. Branch vessels are patent. Scattered coronary artery calcifications are noted.  Mediastinum/Nodes: Borderline enlarged right hilar lymph nodes largest measuring 12.5 mm on image number 72/3. Scattered sub 8 mm mediastinal lymph nodes. The esophagus is grossly normal. The thyroid gland is unremarkable. Lungs/Pleura: Mild emphysematous changes are noted. No acute pulmonary findings or worrisome pulmonary lesions. No pulmonary nodules to suggest pulmonary metastatic disease. No pleural effusion or pleural nodules. No bronchiectasis or interstitial lung disease. Upper Abdomen: No significant upper abdominal findings. No worrisome hepatic lesions. Musculoskeletal: No significant bony findings. IMPRESSION: 1. No CT findings for pulmonary metastatic disease. 2. Borderline mediastinal and hilar nodes, likely reactive and due to the patients emphysema. 3. Mild emphysematous changes. 4. Age advanced coronary artery calcifications. 5. Emphysema and aortic atherosclerosis. Aortic Atherosclerosis (ICD10-I70.0) and Emphysema (ICD10-J43.9). Electronically Signed   By: Marijo Sanes M.D.   On: 06/07/2020 08:36   NM PET Image Initial (PI) Skull Base To Thigh  Result Date: 06/28/2020 CLINICAL DATA:  Initial treatment strategy for rectal cancer. EXAM: NUCLEAR MEDICINE PET SKULL BASE TO THIGH TECHNIQUE: 9.965 mCi F-18 FDG was injected intravenously. Full-ring PET imaging was performed from  the skull base to thigh after the radiotracer. CT data was obtained and used for attenuation correction and anatomic localization. Fasting blood glucose: 94 mg/dl COMPARISON:  CT scan 05/11/2020 FINDINGS: Mediastinal blood pool activity: SUV max 1.94 Liver activity: SUV max NA NECK: No hypermetabolic lymph nodes in the neck. Incidental CT findings: none CHEST: No hypermetabolic mediastinal or hilar nodes. No suspicious pulmonary nodules on the CT scan. Minimal FDG activity noted in the distal esophagus likely due to mild inflammations/reflux esophagitis. No esophageal lesion is identified. Incidental CT findings: Age  advanced coronary artery calcifications are noted mainly in the LAD. ABDOMEN/PELVIS: Fairly large rectal mass is markedly hypermetabolic with SUV max of 123XX123. No findings suspicious for involvement of the mesorectum. No mesorectal adenopathy. No enlarged or hypermetabolic lymph nodes in the sigmoid mesocolon or retroperitoneum. No findings suspicious for hepatic metastatic disease. The lower pole left renal mass is not hypermetabolic but this is not atypical and still quite suspicious for a renal cell neoplasm. Incidental CT findings: Scattered vascular calcifications. SKELETON: No focal hypermetabolic activity to suggest skeletal metastasis. Incidental CT findings: none IMPRESSION: 1. Markedly hypermetabolic large rectal mass consistent with known rectal carcinoma. No findings suspicious for locoregional adenopathy or hepatic metastatic disease. 2. The lower pole left renal mass is not hypermetabolic but still highly suspicious for renal neoplasm. Recommend urology consultation. Electronically Signed   By: Marijo Sanes M.D.   On: 06/28/2020 15:44    ASSESSMENT: Stage IIIb rectal cancer.  PLAN:    1.  Stage IIIb rectal cancer: Pathology and imaging results reviewed independently.  EUS completed at outside facility confirmed stage of disease.  PET scan results from June 28, 2020 reviewed independently and reported as above with no obvious malignancy outside of patient's known rectal mass.  He will benefit from neoadjuvant chemotherapy using capecitabine along with daily XRT.  Patient had an appointment with radiation oncology today with plans to simulate him early next week and start treatment approximately July 10, 2020.  Patient's dose of capecitabine is 1650 mg daily 5 days a week along with XRT.  Return to clinic at the beginning of XRT to initiate treatment.  Appreciate palliative care input.  Patient has also been given referrals to genetics and dietary.   2.  Weight loss: Referral to dietary  as above. 3.  Pain: Continue Percocet.  Patient does not wish to change his narcotics at this time. 4.  Left kidney mass: 2.8 x 3.6 x 3.4 mass highly concerning for a second malignancy.  Patient will likely have to undergo partial nephrectomy at the time of his rectal surgery.   Patient expressed understanding and was in agreement with this plan. He also understands that He can call clinic at any time with any questions, concerns, or complaints.   Cancer Staging Rectal cancer [facility]) Staging form: Colon and Rectum, AJCC 8th Edition - Clinical stage from 06/22/2020: Stage IIIB (cT3, cN1a, cM0) - Signed by Lloyd Huger, MD on 06/22/2020   Lloyd Huger, MD   06/30/2020 7:49 AM

## 2020-06-28 ENCOUNTER — Encounter
Admission: RE | Admit: 2020-06-28 | Discharge: 2020-06-28 | Disposition: A | Discharge status: Home / Self Care | Payer: Self-pay | Source: Ambulatory Visit | Attending: Oncology | Admitting: Oncology

## 2020-06-28 ENCOUNTER — Other Ambulatory Visit: Payer: Self-pay | Admitting: *Deleted

## 2020-06-28 ENCOUNTER — Other Ambulatory Visit: Payer: Self-pay

## 2020-06-28 DIAGNOSIS — C20 Malignant neoplasm of rectum: Secondary | ICD-10-CM

## 2020-06-28 LAB — GLUCOSE, CAPILLARY: Glucose-Capillary: 94 mg/dL (ref 70–99)

## 2020-06-28 MED ORDER — FLUDEOXYGLUCOSE F - 18 (FDG) INJECTION
9.9650 | Freq: Once | INTRAVENOUS | Status: AC | PRN
  Administered 2020-06-28: 9.965 via INTRAVENOUS

## 2020-06-29 ENCOUNTER — Encounter: Payer: Self-pay | Admitting: Radiation Oncology

## 2020-06-29 ENCOUNTER — Inpatient Hospital Stay (HOSPITAL_BASED_OUTPATIENT_CLINIC_OR_DEPARTMENT_OTHER): Payer: Self-pay | Admitting: Oncology

## 2020-06-29 ENCOUNTER — Ambulatory Visit
Admission: RE | Admit: 2020-06-29 | Discharge: 2020-06-29 | Disposition: A | Discharge status: Home / Self Care | Payer: Self-pay | Source: Ambulatory Visit | Attending: Radiation Oncology | Admitting: Radiation Oncology

## 2020-06-29 ENCOUNTER — Inpatient Hospital Stay (HOSPITAL_BASED_OUTPATIENT_CLINIC_OR_DEPARTMENT_OTHER): Payer: Self-pay | Admitting: Hospice and Palliative Medicine

## 2020-06-29 ENCOUNTER — Encounter: Payer: Self-pay | Admitting: Oncology

## 2020-06-29 ENCOUNTER — Other Ambulatory Visit: Payer: Self-pay | Admitting: *Deleted

## 2020-06-29 ENCOUNTER — Other Ambulatory Visit: Payer: Self-pay | Admitting: Licensed Clinical Social Worker

## 2020-06-29 VITALS — BP 120/75 | HR 81 | Temp 96.5°F | Wt 185.6 lb

## 2020-06-29 VITALS — BP 131/78 | HR 86 | Temp 98.4°F | Ht 72.18 in | Wt 185.5 lb

## 2020-06-29 DIAGNOSIS — G893 Neoplasm related pain (acute) (chronic): Secondary | ICD-10-CM

## 2020-06-29 DIAGNOSIS — Z79899 Other long term (current) drug therapy: Secondary | ICD-10-CM | POA: Insufficient documentation

## 2020-06-29 DIAGNOSIS — N2889 Other specified disorders of kidney and ureter: Secondary | ICD-10-CM

## 2020-06-29 DIAGNOSIS — Z515 Encounter for palliative care: Secondary | ICD-10-CM

## 2020-06-29 DIAGNOSIS — F17210 Nicotine dependence, cigarettes, uncomplicated: Secondary | ICD-10-CM | POA: Insufficient documentation

## 2020-06-29 DIAGNOSIS — C20 Malignant neoplasm of rectum: Secondary | ICD-10-CM

## 2020-06-29 DIAGNOSIS — F419 Anxiety disorder, unspecified: Secondary | ICD-10-CM

## 2020-06-29 DIAGNOSIS — R102 Pelvic and perineal pain: Secondary | ICD-10-CM | POA: Insufficient documentation

## 2020-06-29 MED ORDER — ALPRAZOLAM 0.5 MG PO TABS: 0.5000 mg | ORAL_TABLET | Freq: Two times a day (BID) | ORAL | 0 refills | Status: DC | PRN

## 2020-06-29 MED ORDER — CAPECITABINE 500 MG PO TABS: 1500.0000 mg | ORAL_TABLET | Freq: Two times a day (BID) | ORAL | 0 refills | Status: DC

## 2020-06-29 MED ORDER — ONDANSETRON HCL 8 MG PO TABS: 8.0000 mg | ORAL_TABLET | Freq: Three times a day (TID) | ORAL | 0 refills | Status: DC | PRN

## 2020-06-29 MED ORDER — CAPECITABINE 150 MG PO TABS: 150.0000 mg | ORAL_TABLET | Freq: Two times a day (BID) | ORAL | 0 refills | Status: DC

## 2020-06-29 NOTE — Progress Notes (Signed)
[place]  [phone] [phone]   Name: Malik Lynch Date: 06/29/2020 MRN: [id number]  DOB: [birth year]  Patient Care Team: Leonard Downing, MD as PCP - General (Family Medicine) [name], RN as Oncology Nurse Navigator    REASON FOR CONSULTATION: Malik Lynch is a 49 y.o. male with multiple medical problems including stage IIIb rectal cancer on XRT and neoadjuvant chemotherapy with capecitabine.  Patient has had pain, weight loss, and anxiety.  He was referred to palliative care to help address goals and manage ongoing symptoms.  SOCIAL HISTORY:     reports that he has been smoking cigarettes. He has been smoking about 1.00 pack per day. He does not have any smokeless tobacco history on file. He reports current alcohol use of about 1.0 standard drink of alcohol per week. He reports that he does not use drugs.  Patient is married and lives at home with his wife.  He has 2 sons who live nearby.  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    ADVANCE DIRECTIVES:  Not on file  CODE STATUS:   PAST MEDICAL HISTORY:No past medical history on file.  PAST SURGICAL HISTORY: No past surgical history on file.  HEMATOLOGY/ONCOLOGY HISTORY:  Oncology History  Rectal cancer ([place])  06/22/2020 Initial Diagnosis   Rectal cancer ([place])   06/22/2020 Cancer Staging   Staging form: Colon and Rectum, AJCC 8th Edition - Clinical stage from 06/22/2020: Stage IIIB (cT3, cN1a, cM0) - Signed by Lloyd Huger, MD on 06/22/2020   06/22/2020 -  Chemotherapy   The patient had capecitabine (XELODA) 150 MG tablet, 825 mg/m2, Oral, 2 times daily after meals, 0 of 1 cycle, Start date: --, End date: -- capecitabine (XELODA) 500 MG tablet, 825 mg/m2, Oral, 2 times daily after meals, 0 of 1 cycle, Start date: --, End date: --  for chemotherapy treatment.      ALLERGIES:  has  No Known Allergies.  MEDICATIONS:  Current Outpatient Medications  Medication Sig Dispense Refill  . ibuprofen (ADVIL,MOTRIN) 100 MG tablet Take 800 mg by mouth every 6 (six) hours as needed.    Marland Kitchen oxyCODONE-acetaminophen (PERCOCET/ROXICET) 5-325 MG tablet Take 1 tablet by mouth every 6 (six) hours as needed for severe pain. 30 tablet 0  . polyethylene glycol (MIRALAX / GLYCOLAX) 17 g packet Take 17 g by mouth daily.    . tamsulosin (FLOMAX) 0.4 MG CAPS capsule Take 0.4 mg by mouth daily.    . traZODone (DESYREL) 150 MG tablet Take 150 mg by mouth at bedtime. (Patient not taking: Reported on 06/29/2020)     No current facility-administered medications for this visit.    VITAL SIGNS: There were no vitals taken for this visit. There were no vitals filed for this visit.  Estimated body mass index is 25.03 kg/m as calculated from the following:   Height as of an earlier encounter on 06/29/20: 6' 0.18" (1.833 m).   Weight as of an earlier encounter on 06/29/20: 185 lb 8 oz (84.1 kg).  LABS: CBC:    Component Value Date/Time   WBC 9.3 06/20/2020 1435   HGB 14.8 06/20/2020 1435   HCT 42.9 06/20/2020 1435   PLT 205 06/20/2020 1435   MCV 97.1 06/20/2020 1435   NEUTROABS 5.0 06/20/2020 1435   LYMPHSABS 3.3 06/20/2020 1435   MONOABS 0.5 06/20/2020 1435   EOSABS 0.4 06/20/2020 1435   BASOSABS 0.1 06/20/2020  1435   Comprehensive Metabolic Panel:    Component Value Date/Time   NA 135 06/20/2020 1435   K 4.6 06/20/2020 1435   CL 100 06/20/2020 1435   CO2 26 06/20/2020 1435   BUN 12 06/20/2020 1435   CREATININE 0.92 06/20/2020 1435   GLUCOSE 88 06/20/2020 1435   CALCIUM 10.0 06/20/2020 1435   AST 18 06/20/2020 1435   ALT 16 06/20/2020 1435   ALKPHOS 66 06/20/2020 1435   BILITOT 0.5 06/20/2020 1435   PROT 7.6 06/20/2020 1435   ALBUMIN 4.5 06/20/2020 1435    RADIOGRAPHIC STUDIES: CT CHEST W CONTRAST  Result Date: 06/07/2020 CLINICAL DATA:  Left renal lesion and rectal mass  suspected on prior abdominal CT scan. Evaluate for metastatic disease. EXAM: CT CHEST WITH CONTRAST TECHNIQUE: Multidetector CT imaging of the chest was performed during intravenous contrast administration. CONTRAST:  53m OMNIPAQUE IOHEXOL 300 MG/ML  SOLN COMPARISON:  CT abdomen/pelvis 05/11/2020 FINDINGS: Cardiovascular: The heart is normal in size. No pericardial effusion. The aorta is normal in caliber. No dissection. No atherosclerotic calcifications. Branch vessels are patent. Scattered coronary artery calcifications are noted. Mediastinum/Nodes: Borderline enlarged right hilar lymph nodes largest measuring 12.5 mm on image number 72/3. Scattered sub 8 mm mediastinal lymph nodes. The esophagus is grossly normal. The thyroid gland is unremarkable. Lungs/Pleura: Mild emphysematous changes are noted. No acute pulmonary findings or worrisome pulmonary lesions. No pulmonary nodules to suggest pulmonary metastatic disease. No pleural effusion or pleural nodules. No bronchiectasis or interstitial lung disease. Upper Abdomen: No significant upper abdominal findings. No worrisome hepatic lesions. Musculoskeletal: No significant bony findings. IMPRESSION: 1. No CT findings for pulmonary metastatic disease. 2. Borderline mediastinal and hilar nodes, likely reactive and due to the patients emphysema. 3. Mild emphysematous changes. 4. Age advanced coronary artery calcifications. 5. Emphysema and aortic atherosclerosis. Aortic Atherosclerosis (ICD10-I70.0) and Emphysema (ICD10-J43.9). Electronically Signed   By: PMarijo SanesM.D.   On: 06/07/2020 08:36   NM PET Image Initial (PI) Skull Base To Thigh  Result Date: 06/28/2020 CLINICAL DATA:  Initial treatment strategy for rectal cancer. EXAM: NUCLEAR MEDICINE PET SKULL BASE TO THIGH TECHNIQUE: 9.965 mCi F-18 FDG was injected intravenously. Full-ring PET imaging was performed from the skull base to thigh after the radiotracer. CT data was obtained and used for  attenuation correction and anatomic localization. Fasting blood glucose: 94 mg/dl COMPARISON:  CT scan 05/11/2020 FINDINGS: Mediastinal blood pool activity: SUV max 1.94 Liver activity: SUV max NA NECK: No hypermetabolic lymph nodes in the neck. Incidental CT findings: none CHEST: No hypermetabolic mediastinal or hilar nodes. No suspicious pulmonary nodules on the CT scan. Minimal FDG activity noted in the distal esophagus likely due to mild inflammations/reflux esophagitis. No esophageal lesion is identified. Incidental CT findings: Age advanced coronary artery calcifications are noted mainly in the LAD. ABDOMEN/PELVIS: Fairly large rectal mass is markedly hypermetabolic with SUV max of 229.56 No findings suspicious for involvement of the mesorectum. No mesorectal adenopathy. No enlarged or hypermetabolic lymph nodes in the sigmoid mesocolon or retroperitoneum. No findings suspicious for hepatic metastatic disease. The lower pole left renal mass is not hypermetabolic but this is not atypical and still quite suspicious for a renal cell neoplasm. Incidental CT findings: Scattered vascular calcifications. SKELETON: No focal hypermetabolic activity to suggest skeletal metastasis. Incidental CT findings: none IMPRESSION: 1. Markedly hypermetabolic large rectal mass consistent with known rectal carcinoma. No findings suspicious for locoregional adenopathy or hepatic metastatic disease. 2. The lower pole left renal mass is not hypermetabolic  but still highly suspicious for renal neoplasm. Recommend urology consultation. Electronically Signed   By: Marijo Sanes M.D.   On: 06/28/2020 15:44    PERFORMANCE STATUS (ECOG) : 1 - Symptomatic but completely ambulatory  Review of Systems Unless otherwise noted, a complete review of systems is negative.  Physical Exam General: NAD Pulmonary: Unlabored Extremities: no edema, no joint deformities Skin: no rashes Neurological: Grossly nonfocal  IMPRESSION: Met with  patient and wife today following their visit with Dr. Grayland Ormond.  Introduced palliative care services and attempted to establish therapeutic rapport.  Patient's goals are clearly aligned with ongoing treatment.  Symptomatically, he has had abdominal and rectal pain for which he is taking Percocet.  He says that he is taking Percocet once or twice a day on average.  He still has some pain at night, which is making sleep difficult.  Patient is not really requiring enough short acting to justify adding a long-acting opioid.  However, I encouraged him to liberalize pain medication if needed.  Patient is also using acetaminophen and ibuprofen during the day.  Patient also is having difficulty sleeping at night.  He was started on trazodone 150 mg nightly but does not feel that it helps him sleep and has caused dry mouth.  He says the primary issue is initiation of sleep due to persistent anxiety.  We will try patient on alprazolam twice daily as needed.  Would recommend starting an SSRI if anxiety persists.  Patient will benefit from conversation regarding ACP at some point in the future.  PLAN: -Continue current scope of treatment -Alprazolam 0.5 mg twice daily as needed for anxiety -Ondansetron 8 mg 3 times daily as needed for nausea -We will benefit from ACP conversation -RTC 2 to 3 weeks  PDMP reviewed  Case and plan discussed with Dr. Grayland Ormond   Patient expressed understanding and was in agreement with this plan. He also understands that He can call the clinic at any time with any questions, concerns, or complaints.     Time Total: 25 minutes  Visit consisted of counseling and education dealing with the complex and emotionally intense issues of symptom management and palliative care in the setting of serious and potentially life-threatening illness.Greater than 50%  of this time was spent counseling and coordinating care related to the above assessment and plan.  Signed by: Altha Harm, PhD, NP-C

## 2020-06-29 NOTE — Consult Note (Signed)
NEW PATIENT EVALUATION  Name: Malik Lynch  MRN: [id number]  Date:   06/29/2020     DOB: 09-11-1970   This 49 y.o. 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 Rectal Cancer    DIAGNOSIS: The encounter diagnosis was Rectal cancer ([facility]).   PREVIOUS INVESTIGATIONS:  Endoscopy report PET CT scan reviewed Clinical notes reviewed Pathology report reviewed  HPI: Patient is a 49 year old male who presented with mostly decreasing ability to urinate and pelvic pain.  CT scan demonstrated a 2.8 x 3.6 x 3.4 cm left renal pole mass consistent with neoplasm for which urology consultation is being made.  He had also thickening regular appearance of the rectal wall.  Patient underwent colonoscopy which showed a rectal mass abutting the dentate line biopsies were positive for high-grade dysplasia with possible invasion.  CT scan demonstrated no evidence of metastatic disease to his liver.  He cannot undergo MRI scan secondary to metal found in his eye.  Rectal exam revealed a 5 cm hard fixed nodular rectal mass circumferential.  Endoscopic findings showed mass 0.5 cm from the anal verge partially circumferential measuring 4 cm in length.  Sonographic evidence of suggestive breakthrough the muscularis propria with invasion into the perirectal fat.  The lesion appeared to abut but not invade the prostate or seminal vesicle.  There were also a few malignant appearing lymph nodes in the peritumoral location.  PET CT scan demonstrated large rectal mass consistent with known rectal carcinoma no findings suggestive of local regional adenopathy or hepatic metastatic disease the lower left renal mass is not hypermetabolic but still highly suspicious for renal neoplasm.  Patient is seen today for consideration of concurrent  chemoradiation.  He continues have pelvic pain and difficulty with urination.  He is having some slight blood per rectum. PLANNED TREATMENT REGIMEN: Concurrent chemoradiation  PAST MEDICAL HISTORY:  has no past medical history on file.    PAST SURGICAL HISTORY: History reviewed. No pertinent surgical history.  FAMILY HISTORY: family history includes Cancer in his paternal grandfather.  SOCIAL HISTORY:  reports that he has been smoking cigarettes. He has been smoking about 1.00 pack per day. He does not have any smokeless tobacco history on file. He reports current alcohol use of about 1.0 standard drink of alcohol per week. He reports that he does not use drugs.  ALLERGIES: Patient has no known allergies.  MEDICATIONS:  Current Outpatient Medications  Medication Sig Dispense Refill  . ibuprofen (ADVIL,MOTRIN) 100 MG tablet Take 800 mg by mouth every 6 (six) hours as needed.    Marland Kitchen oxyCODONE-acetaminophen (PERCOCET/ROXICET) 5-325 MG tablet Take 1 tablet by mouth every 6 (six) hours as needed for severe pain. 30 tablet 0  . tamsulosin (FLOMAX) 0.4 MG CAPS capsule Take 0.4 mg by mouth daily.    . traZODone (DESYREL) 150 MG tablet Take 150 mg by mouth at bedtime. (Patient not taking: Reported on 06/29/2020)    . polyethylene glycol (MIRALAX / GLYCOLAX) 17 g packet Take 17 g by mouth daily.     No current facility-administered medications for this encounter.    ECOG PERFORMANCE STATUS:  1 - Symptomatic but completely ambulatory  REVIEW OF SYSTEMS: Patient denies any weight loss, fatigue, weakness, fever, chills or night sweats. Patient denies any loss of vision, blurred vision. Patient denies any  ringing  of the ears or hearing loss. No irregular heartbeat. Patient denies heart murmur or history of fainting. Patient denies any chest pain or pain radiating to her upper extremities. Patient denies any shortness of breath, difficulty breathing at night, cough or hemoptysis. Patient denies any  swelling in the lower legs. Patient denies any nausea vomiting, vomiting of blood, or coffee ground material in the vomitus. Patient denies any stomach pain. Patient states has had normal bowel movements no significant constipation or diarrhea. Patient denies any dysuria, hematuria or significant nocturia. Patient denies any problems walking, swelling in the joints or loss of balance. Patient denies any skin changes, loss of hair or loss of weight. Patient denies any excessive worrying or anxiety or significant depression. Patient denies any problems with insomnia. Patient denies excessive thirst, polyuria, polydipsia. Patient denies any swollen glands, patient denies easy bruising or easy bleeding. Patient denies any recent infections, allergies or URI. Patient "s visual fields have not changed significantly in recent time.   PHYSICAL EXAM: BP 120/75   Pulse 81   Temp (!) 96.5 F (35.8 C) (Tympanic)   Wt (P) 185 lb 9.6 oz (84.2 kg)   BMI (P) 25.35 kg/m  Well-developed well-nourished patient in NAD. HEENT reveals PERLA, EOMI, discs not visualized.  Oral cavity is clear. No oral mucosal lesions are identified. Neck is clear without evidence of cervical or supraclavicular adenopathy. Lungs are clear to A&P. Cardiac examination is essentially unremarkable with regular rate and rhythm without murmur rub or thrill. Abdomen is benign with no organomegaly or masses noted. Motor sensory and DTR levels are equal and symmetric in the upper and lower extremities. Cranial nerves II through XII are grossly intact. Proprioception is intact. No peripheral adenopathy or edema is identified. No motor or sensory levels are noted. Crude visual fields are within normal range.  LABORATORY DATA: Pathology report reviewed pathology is being reviewed at our facility    RADIOLOGY RESULTS: CT scan PET CT scans all reviewed compatible with above-stated findings   IMPRESSION: Stage IIIb adenocarcinoma the rectum in  49 year old male for concurrent chemoradiation therapy.  Also will order urology consultation for his renal mass  PLAN: Present time patient I have recommended concurrent chemoradiation therapy.  I would plan on delivering 45 Gray over 5 weeks boosting the mass another 540 cGy using external beam radiation therapy with concurrent chemotherapy.  Risks and benefits of treatment including possible diarrhea fatigue increased lower Neri tract symptoms skin reaction alteration of blood counts all were described in detail to the patient.  I have personally set up and ordered CT simulation for next week.  We will also arrange for urology consult for his renal mass.  Patient comprehends my recommendations well.  I would like to take this opportunity to thank you for allowing me to participate in the care of your patient.Carmina Miller, MD

## 2020-06-30 MED ORDER — PROCHLORPERAZINE MALEATE 10 MG PO TABS: 10.0000 mg | ORAL_TABLET | Freq: Four times a day (QID) | ORAL | 1 refills | Status: DC | PRN

## 2020-06-30 MED ORDER — ONDANSETRON HCL 8 MG PO TABS: 8.0000 mg | ORAL_TABLET | Freq: Two times a day (BID) | ORAL | 1 refills | Status: DC | PRN

## 2020-07-03 ENCOUNTER — Encounter (INDEPENDENT_AMBULATORY_CARE_PROVIDER_SITE_OTHER): Payer: Self-pay

## 2020-07-03 ENCOUNTER — Other Ambulatory Visit: Payer: Self-pay

## 2020-07-03 ENCOUNTER — Inpatient Hospital Stay: Payer: Self-pay | Attending: Oncology | Admitting: Pharmacist

## 2020-07-03 ENCOUNTER — Other Ambulatory Visit: Payer: Self-pay | Admitting: *Deleted

## 2020-07-03 ENCOUNTER — Ambulatory Visit
Admission: RE | Admit: 2020-07-03 | Discharge: 2020-07-03 | Disposition: A | Discharge status: Home / Self Care | Payer: Self-pay | Source: Ambulatory Visit | Attending: Radiation Oncology | Admitting: Radiation Oncology

## 2020-07-03 DIAGNOSIS — G4700 Insomnia, unspecified: Secondary | ICD-10-CM | POA: Insufficient documentation

## 2020-07-03 DIAGNOSIS — N2889 Other specified disorders of kidney and ureter: Secondary | ICD-10-CM | POA: Insufficient documentation

## 2020-07-03 DIAGNOSIS — C20 Malignant neoplasm of rectum: Secondary | ICD-10-CM | POA: Insufficient documentation

## 2020-07-03 DIAGNOSIS — K5900 Constipation, unspecified: Secondary | ICD-10-CM | POA: Insufficient documentation

## 2020-07-03 DIAGNOSIS — F17210 Nicotine dependence, cigarettes, uncomplicated: Secondary | ICD-10-CM | POA: Insufficient documentation

## 2020-07-03 DIAGNOSIS — Z510 Encounter for antineoplastic radiation therapy: Secondary | ICD-10-CM | POA: Insufficient documentation

## 2020-07-03 DIAGNOSIS — Z809 Family history of malignant neoplasm, unspecified: Secondary | ICD-10-CM | POA: Insufficient documentation

## 2020-07-03 DIAGNOSIS — R634 Abnormal weight loss: Secondary | ICD-10-CM | POA: Insufficient documentation

## 2020-07-03 DIAGNOSIS — Z79899 Other long term (current) drug therapy: Secondary | ICD-10-CM | POA: Insufficient documentation

## 2020-07-03 DIAGNOSIS — R339 Retention of urine, unspecified: Secondary | ICD-10-CM | POA: Insufficient documentation

## 2020-07-03 MED ORDER — OXYCODONE-ACETAMINOPHEN 5-325 MG PO TABS: 1.0000 | ORAL_TABLET | ORAL | 0 refills | Status: DC | PRN

## 2020-07-03 MED ORDER — TRAZODONE HCL 150 MG PO TABS: 150.0000 mg | ORAL_TABLET | Freq: Every day | ORAL | 1 refills | Status: DC

## 2020-07-03 NOTE — Progress Notes (Signed)
Oral Chemotherapy Clinic [facility]  Telephone:([phone] [phone]  Patient Care Team: Kaleen MaskElkins, Wilson Oliver, MD as PCP - General (Family Medicine) Benita GutterStanton, Kristi D, RN as Oncology Nurse Navigator   Name of the patient: Malik Lynch  [id number]  12/20/1970   Date of visit: 07/03/20  HPI: Patient is a 50 y.o. male with stage IIIb rectal cancer. Planned treatment of Xeloda (capecitabine) and XRT.   Reason for Consult: Capecitabine oral chemotherapy education.   PAST MEDICAL HISTORY:No past medical history on file.  PAST SURGICAL HISTORY: No past surgical history on file.  HEMATOLOGY/ONCOLOGY HISTORY:  Oncology History  Rectal cancer ([facility])  06/22/2020 Initial Diagnosis   Rectal cancer ([facility])   06/22/2020 Cancer Staging   Staging form: Colon and Rectum, AJCC 8th Edition - Clinical stage from 06/22/2020: Stage IIIB (cT3, cN1a, cM0) - Signed by Jeralyn RuthsFinnegan, Timothy J, MD on 06/22/2020   06/22/2020 -  Chemotherapy   The patient had capecitabine (XELODA) 150 MG tablet, 150 mg (100 % of original dose 150 mg), Oral, 2 times daily after meals, 0 of 1 cycle, Start date: 06/29/2020, End date: -- Dose modification: 150 mg (original dose 150 mg, Cycle 1) capecitabine (XELODA) 500 MG tablet, 1,500 mg (100 % of original dose 1,500 mg), Oral, 2 times daily after meals, 0 of 1 cycle, Start date: 06/29/2020, End date: -- Dose modification: 1,500 mg (original dose 1,500 mg, Cycle 1)  for chemotherapy treatment.      ALLERGIES:  has No Known Allergies.  MEDICATIONS:  Current Outpatient Medications  Medication Sig Dispense Refill  . ALPRAZolam (XANAX) 0.5 MG tablet Take 1 tablet (0.5 mg total) by mouth 2 (two) times daily as needed for anxiety. 60 tablet 0  . capecitabine (XELODA) 150 MG tablet Take 1 tablet (150 mg total) by mouth 2 (two) times daily after a meal. Take Monday through Friday during radiation. Take only on days of radiation. 60 tablet 0   . capecitabine (XELODA) 500 MG tablet Take 3 tablets (1,500 mg total) by mouth 2 (two) times daily after a meal. Take Monday through Friday during radiation. Take only on days of radiation. 180 tablet 0  . ibuprofen (ADVIL,MOTRIN) 100 MG tablet Take 800 mg by mouth every 6 (six) hours as needed.    . ondansetron (ZOFRAN) 8 MG tablet Take 1 tablet (8 mg total) by mouth every 8 (eight) hours as needed for nausea or vomiting. 45 tablet 0  . ondansetron (ZOFRAN) 8 MG tablet Take 1 tablet (8 mg total) by mouth 2 (two) times daily as needed (Nausea or vomiting). 60 tablet 1  . oxyCODONE-acetaminophen (PERCOCET/ROXICET) 5-325 MG tablet Take 1-2 tablets by mouth every 4 (four) hours as needed for severe pain. 60 tablet 0  . polyethylene glycol (MIRALAX / GLYCOLAX) 17 g packet Take 17 g by mouth daily.    . prochlorperazine (COMPAZINE) 10 MG tablet Take 1 tablet (10 mg total) by mouth every 6 (six) hours as needed (Nausea or vomiting). 60 tablet 1  . tamsulosin (FLOMAX) 0.4 MG CAPS capsule Take 0.4 mg by mouth daily.    . traZODone (DESYREL) 150 MG tablet Take 1 tablet (150 mg total) by mouth at bedtime. 30 tablet 1   No current facility-administered medications for this visit.    VITAL SIGNS: There were no vitals taken for this visit. There were no vitals filed for this visit.  Estimated body mass index is 25.03 kg/m as calculated from the following:   Height as  of 06/29/20: 6' 0.18" (1.833 m).   Weight as of 06/29/20: 84.1 kg (185 lb 8 oz).  LABS: CBC:    Component Value Date/Time   WBC 9.3 06/20/2020 1435   HGB 14.8 06/20/2020 1435   HCT 42.9 06/20/2020 1435   PLT 205 06/20/2020 1435   MCV 97.1 06/20/2020 1435   NEUTROABS 5.0 06/20/2020 1435   LYMPHSABS 3.3 06/20/2020 1435   MONOABS 0.5 06/20/2020 1435   EOSABS 0.4 06/20/2020 1435   BASOSABS 0.1 06/20/2020 1435   Comprehensive Metabolic Panel:    Component Value Date/Time   NA 135 06/20/2020 1435   K 4.6 06/20/2020 1435   CL 100  06/20/2020 1435   CO2 26 06/20/2020 1435   BUN 12 06/20/2020 1435   CREATININE 0.92 06/20/2020 1435   GLUCOSE 88 06/20/2020 1435   CALCIUM 10.0 06/20/2020 1435   AST 18 06/20/2020 1435   ALT 16 06/20/2020 1435   ALKPHOS 66 06/20/2020 1435   BILITOT 0.5 06/20/2020 1435   PROT 7.6 06/20/2020 1435   ALBUMIN 4.5 06/20/2020 1435    RADIOGRAPHIC STUDIES: CT CHEST W CONTRAST  Result Date: 06/07/2020 CLINICAL DATA:  Left renal lesion and rectal mass suspected on prior abdominal CT scan. Evaluate for metastatic disease. EXAM: CT CHEST WITH CONTRAST TECHNIQUE: Multidetector CT imaging of the chest was performed during intravenous contrast administration. CONTRAST:  38mL OMNIPAQUE IOHEXOL 300 MG/ML  SOLN COMPARISON:  CT abdomen/pelvis 05/11/2020 FINDINGS: Cardiovascular: The heart is normal in size. No pericardial effusion. The aorta is normal in caliber. No dissection. No atherosclerotic calcifications. Branch vessels are patent. Scattered coronary artery calcifications are noted. Mediastinum/Nodes: Borderline enlarged right hilar lymph nodes largest measuring 12.5 mm on image number 72/3. Scattered sub 8 mm mediastinal lymph nodes. The esophagus is grossly normal. The thyroid gland is unremarkable. Lungs/Pleura: Mild emphysematous changes are noted. No acute pulmonary findings or worrisome pulmonary lesions. No pulmonary nodules to suggest pulmonary metastatic disease. No pleural effusion or pleural nodules. No bronchiectasis or interstitial lung disease. Upper Abdomen: No significant upper abdominal findings. No worrisome hepatic lesions. Musculoskeletal: No significant bony findings. IMPRESSION: 1. No CT findings for pulmonary metastatic disease. 2. Borderline mediastinal and hilar nodes, likely reactive and due to the patients emphysema. 3. Mild emphysematous changes. 4. Age advanced coronary artery calcifications. 5. Emphysema and aortic atherosclerosis. Aortic Atherosclerosis (ICD10-I70.0) and Emphysema  (ICD10-J43.9). Electronically Signed   By: Marijo Sanes M.D.   On: 06/07/2020 08:36   NM PET Image Initial (PI) Skull Base To Thigh  Result Date: 06/28/2020 CLINICAL DATA:  Initial treatment strategy for rectal cancer. EXAM: NUCLEAR MEDICINE PET SKULL BASE TO THIGH TECHNIQUE: 9.965 mCi F-18 FDG was injected intravenously. Full-ring PET imaging was performed from the skull base to thigh after the radiotracer. CT data was obtained and used for attenuation correction and anatomic localization. Fasting blood glucose: 94 mg/dl COMPARISON:  CT scan 05/11/2020 FINDINGS: Mediastinal blood pool activity: SUV max 1.94 Liver activity: SUV max NA NECK: No hypermetabolic lymph nodes in the neck. Incidental CT findings: none CHEST: No hypermetabolic mediastinal or hilar nodes. No suspicious pulmonary nodules on the CT scan. Minimal FDG activity noted in the distal esophagus likely due to mild inflammations/reflux esophagitis. No esophageal lesion is identified. Incidental CT findings: Age advanced coronary artery calcifications are noted mainly in the LAD. ABDOMEN/PELVIS: Fairly large rectal mass is markedly hypermetabolic with SUV max of [id number]. No findings suspicious for involvement of the mesorectum. No mesorectal adenopathy. No enlarged or hypermetabolic lymph nodes in  the sigmoid mesocolon or retroperitoneum. No findings suspicious for hepatic metastatic disease. The lower pole left renal mass is not hypermetabolic but this is not atypical and still quite suspicious for a renal cell neoplasm. Incidental CT findings: Scattered vascular calcifications. SKELETON: No focal hypermetabolic activity to suggest skeletal metastasis. Incidental CT findings: none IMPRESSION: 1. Markedly hypermetabolic large rectal mass consistent with known rectal carcinoma. No findings suspicious for locoregional adenopathy or hepatic metastatic disease. 2. The lower pole left renal mass is not hypermetabolic but still highly suspicious for renal  neoplasm. Recommend urology consultation. Electronically Signed   By: Rudie Meyer M.D.   On: 06/28/2020 15:44     Assessment and Plan-  Patient to start Xeloda with first radiation treatment next week (07/11/20).   Patient Education I spoke with patient and his spouse, Malik Lynch, for overview of new oral chemotherapy medication: Xeloda (capecitaine) for the neoadjuvant treatment of stage IIIb rectal cancer in conjunction with XRT, planned duration until the end of radiation treatment.  Pt is doing well. Counseled patient on administration, dosing, side effects, monitoring, drug-food interactions, safe handling, storage, and disposal. Patient will take three 500 mg tablets and one 150 mg tablet by mouth 2 (two) times daily after a meal. Take Monday through Friday during radiation. Take only on days of radiation.  Side effects include but not limited to: diarrhea, hand-foot syndrome, edema, decreased WBC, fatigue.    Reviewed with patient importance of keeping a medication schedule and plan for any missed doses.  After discussion with patient no patient barriers to medication adherence identified.   Malik Lynch voiced understanding and appreciation. All questions answered. Medication handout provided.  Provided patient with Oral Chemotherapy Navigation Clinic phone number. Patient knows to call the office with questions or concerns. Oral Chemotherapy Navigation Clinic will continue to follow.  Medication Access Issues: Darel Hong will call patient today to set up medication delivery from [facility]. Patient uninsured, using 340b pricing.   Patient expressed understanding and was in agreement with this plan. He also understands that He can call clinic at any time with any questions, concerns, or complaints.   Thank you for allowing me to participate in the care of this very pleasant patient.   Time Total: 15 minutes  Visit consisted of counseling and education on dealing with issues of  symptom management in the setting of serious and potentially life-threatening illness.Greater than 50%  of this time was spent counseling and coordinating care related to the above assessment and plan.  Signed by: Remi Haggard, PharmD, BCPS, Nolon Bussing, CPP Hematology/Oncology Clinical Pharmacist Practitioner [facility]/AP Oral Chemotherapy Navigation Clinic [phone]  07/03/2020 1:42 PM

## 2020-07-04 MED FILL — CAPECITABINE 150 MG TABS: 150 | 42 days supply | Qty: 60 | Fill #0

## 2020-07-05 ENCOUNTER — Telehealth: Payer: Self-pay | Admitting: *Deleted

## 2020-07-05 MED ORDER — OXYCODONE HCL ER 10 MG PO T12A
10.0000 mg | EXTENDED_RELEASE_TABLET | Freq: Two times a day (BID) | ORAL | 0 refills | Status: DC
Start: 2020-07-05 — End: 2020-07-13

## 2020-07-05 NOTE — Telephone Encounter (Signed)
I spoke with patient's wife by phone.  She reports that patient's anxiety is significantly improved on alprazolam.  However, he is still having difficulty sleeping despite use of trazodone.  He is waking several times at night to take the Percocet.  He finds that his pain is poorly controlled at night.  He is trying to limit his use of Percocet during the day due to constipation.  We discussed starting him on a long-acting opioid to better control pain around-the-clock and she thought that would be a good idea.  We will send Rx for OxyContin 10 mg every 12 hours to [facility] pharmacy.

## 2020-07-05 NOTE — Telephone Encounter (Signed)
Amy called reporting that sleep medicine that is not in the Ambien family was to have been ordered for patient and was not. She is requesting a return call to discuss

## 2020-07-07 MED FILL — XELODA 500 MG TABLET: 500 | 42 days supply | Qty: 180 | Fill #0

## 2020-07-08 NOTE — Progress Notes (Signed)
[place]  [phone] [phone]) [phone]  ID: Malik Lynch OB: 01-May-1971  MR#: [id number]  PPJ#:[id number]  Patient Care Team: Leonard Downing, MD as PCP - General (Family Medicine) [name], RN as Oncology Nurse Navigator  CHIEF COMPLAINT: Stage IIIb rectal cancer.  INTERVAL HISTORY: Patient returns to clinic today for further evaluation and initiation of capecitabine and XRT.  He continues to have pain, but otherwise feels well.  He has difficulty urinating.  He does not complain of any melena or hematochezia.  He has no neurologic complaints.  He denies any recent fevers or illnesses.  He has a fair appetite.  He has no chest pain, shortness of breath, cough, or hemoptysis.  He denies any nausea, vomiting, constipation, or diarrhea.  Patient offers no further specific complaints today.  REVIEW OF SYSTEMS:   Review of Systems  Constitutional: Positive for malaise/fatigue and weight loss. Negative for fever.  Respiratory: Negative.  Negative for cough, hemoptysis and shortness of breath.   Cardiovascular: Negative.  Negative for chest pain and leg swelling.  Gastrointestinal: Positive for abdominal pain. Negative for blood in stool and melena.  Genitourinary: Positive for urgency.  Musculoskeletal: Negative.  Negative for back pain.  Skin: Negative.  Negative for rash.  Neurological: Positive for weakness. Negative for dizziness, focal weakness and headaches.  Psychiatric/Behavioral: Negative.  The patient is not nervous/anxious.     As per HPI. Otherwise, a complete review of systems is negative.  PAST MEDICAL HISTORY: History reviewed. No pertinent past medical history.  PAST SURGICAL HISTORY: History reviewed. No pertinent surgical history.  FAMILY HISTORY: Family History  Problem Relation Age of Onset  . Cancer Paternal Grandfather     ADVANCED DIRECTIVES (Y/N):  N  HEALTH MAINTENANCE: Social History   Tobacco  Use  . Smoking status: Current Some Day Smoker    Packs/day: 1.00    Types: Cigarettes  Vaping Use  . Vaping Use: Never used  Substance Use Topics  . Alcohol use: Yes    Alcohol/week: 1.0 standard drink    Types: 1 Cans of beer per week  . Drug use: No     Colonoscopy:  PAP:  Bone density:  Lipid panel:  No Known Allergies  Current Outpatient Medications  Medication Sig Dispense Refill  . ALPRAZolam (XANAX) 0.5 MG tablet Take 1 tablet (0.5 mg total) by mouth 2 (two) times daily as needed for anxiety. 60 tablet 0  . capecitabine (XELODA) 150 MG tablet Take 1 tablet (150 mg total) by mouth 2 (two) times daily after a meal. Take Monday through Friday during radiation. Take only on days of radiation. 60 tablet 0  . capecitabine (XELODA) 500 MG tablet Take 3 tablets (1,500 mg total) by mouth 2 (two) times daily after a meal. Take Monday through Friday during radiation. Take only on days of radiation. 180 tablet 0  . ibuprofen (ADVIL,MOTRIN) 100 MG tablet Take 800 mg by mouth every 6 (six) hours as needed.    . ondansetron (ZOFRAN) 8 MG tablet Take 1 tablet (8 mg total) by mouth every 8 (eight) hours as needed for nausea or vomiting. 45 tablet 0  . ondansetron (ZOFRAN) 8 MG tablet Take 1 tablet (8 mg total) by mouth 2 (two) times daily as needed (Nausea or vomiting). 60 tablet 1  . oxyCODONE (OXYCONTIN) 10 mg 12 hr tablet Take 1 tablet (10 mg total) by mouth every 12 (twelve) hours. 30 tablet 0  . oxyCODONE-acetaminophen (PERCOCET/ROXICET) 5-325 MG tablet  Take 1-2 tablets by mouth every 4 (four) hours as needed for severe pain. 60 tablet 0  . polyethylene glycol (MIRALAX / GLYCOLAX) 17 g packet Take 17 g by mouth daily.    . prochlorperazine (COMPAZINE) 10 MG tablet Take 1 tablet (10 mg total) by mouth every 6 (six) hours as needed (Nausea or vomiting). 60 tablet 1  . tamsulosin (FLOMAX) 0.4 MG CAPS capsule Take 0.4 mg by mouth daily.    . traZODone (DESYREL) 150 MG tablet Take 1 tablet  (150 mg total) by mouth at bedtime. 30 tablet 1   No current facility-administered medications for this visit.    OBJECTIVE: Vitals:   07/11/20 0905  BP: 126/84  Pulse: 80  Temp: 97.7 F (36.5 C)     Body mass index is 25.6 kg/m.    ECOG FS:0 - Asymptomatic  General: Well-developed, well-nourished, no acute distress. Eyes: Pink conjunctiva, anicteric sclera. HEENT: Normocephalic, moist mucous membranes. Lungs: No audible wheezing or coughing. Heart: Regular rate and rhythm. Abdomen: Soft, nontender, no obvious distention. Musculoskeletal: No edema, cyanosis, or clubbing. Neuro: Alert, answering all questions appropriately. Cranial nerves grossly intact. Skin: No rashes or petechiae noted. Psych: Normal affect.  LAB RESULTS:  Lab Results  Component Value Date   NA 137 07/11/2020   K 4.5 07/11/2020   CL 99 07/11/2020   CO2 29 07/11/2020   GLUCOSE 76 07/11/2020   BUN 12 07/11/2020   CREATININE 0.97 07/11/2020   CALCIUM 9.4 07/11/2020   PROT 7.2 07/11/2020   ALBUMIN 4.1 07/11/2020   AST 15 07/11/2020   ALT 15 07/11/2020   ALKPHOS 72 07/11/2020   BILITOT 0.4 07/11/2020   GFRNONAA >60 07/11/2020    Lab Results  Component Value Date   WBC 9.4 07/11/2020   NEUTROABS 5.2 07/11/2020   HGB 13.9 07/11/2020   HCT 38.9 (L) 07/11/2020   MCV 96.0 07/11/2020   PLT 296 07/11/2020     STUDIES: NM PET Image Initial (PI) Skull Base To Thigh  Result Date: 06/28/2020 CLINICAL DATA:  Initial treatment strategy for rectal cancer. EXAM: NUCLEAR MEDICINE PET SKULL BASE TO THIGH TECHNIQUE: 9.965 mCi F-18 FDG was injected intravenously. Full-ring PET imaging was performed from the skull base to thigh after the radiotracer. CT data was obtained and used for attenuation correction and anatomic localization. Fasting blood glucose: 94 mg/dl COMPARISON:  CT scan 05/11/2020 FINDINGS: Mediastinal blood pool activity: SUV max 1.94 Liver activity: SUV max NA NECK: No hypermetabolic lymph  nodes in the neck. Incidental CT findings: none CHEST: No hypermetabolic mediastinal or hilar nodes. No suspicious pulmonary nodules on the CT scan. Minimal FDG activity noted in the distal esophagus likely due to mild inflammations/reflux esophagitis. No esophageal lesion is identified. Incidental CT findings: Age advanced coronary artery calcifications are noted mainly in the LAD. ABDOMEN/PELVIS: Fairly large rectal mass is markedly hypermetabolic with SUV max of 123XX123. No findings suspicious for involvement of the mesorectum. No mesorectal adenopathy. No enlarged or hypermetabolic lymph nodes in the sigmoid mesocolon or retroperitoneum. No findings suspicious for hepatic metastatic disease. The lower pole left renal mass is not hypermetabolic but this is not atypical and still quite suspicious for a renal cell neoplasm. Incidental CT findings: Scattered vascular calcifications. SKELETON: No focal hypermetabolic activity to suggest skeletal metastasis. Incidental CT findings: none IMPRESSION: 1. Markedly hypermetabolic large rectal mass consistent with known rectal carcinoma. No findings suspicious for locoregional adenopathy or hepatic metastatic disease. 2. The lower pole left renal mass is not hypermetabolic  but still highly suspicious for renal neoplasm. Recommend urology consultation. Electronically Signed   By: Marijo Sanes M.D.   On: 06/28/2020 15:44    ASSESSMENT: Stage IIIb rectal cancer.  PLAN:    1.  Stage IIIb rectal cancer: Pathology and imaging results reviewed independently.  EUS completed at outside facility confirmed stage of disease.  PET scan results from June 28, 2020 reviewed independently and reported as above with no obvious malignancy outside of patient's known rectal mass.  He will benefit from neoadjuvant chemotherapy using capecitabine along with daily XRT.  Patient's dose of capecitabine is 1650 mg daily 5 days a week along with XRT.  Proceed with treatment today.  Return to  clinic daily for XRT and then in 1 week for repeat laboratory work and further evaluation.  Patient has both genetics and dietary appointment scheduled in the near future.  Appreciate clinical pharmacy input. 2.  Weight loss: Referral to dietary as above. 3.  Pain: Continue Percocet.  Patient does not wish to change his narcotics at this time. 4.  Left kidney mass: 2.8 x 3.6 x 3.4 mass highly concerning for a second malignancy.  Patient will likely have to undergo partial nephrectomy at the time of his rectal surgery.   I spent a total of 30 minutes reviewing chart data, face-to-face evaluation with the patient, counseling and coordination of care as detailed above.   Patient expressed understanding and was in agreement with this plan. He also understands that He can call clinic at any time with any questions, concerns, or complaints.   Cancer Staging Rectal cancer [facility]) Staging form: Colon and Rectum, AJCC 8th Edition - Clinical stage from 06/22/2020: Stage IIIB (cT3, cN1a, cM0) - Signed by Lloyd Huger, MD on 06/22/2020   Lloyd Huger, MD   07/12/2020 6:37 AM

## 2020-07-10 ENCOUNTER — Ambulatory Visit: Admission: RE | Admit: 2020-07-10 | Payer: Self-pay | Source: Ambulatory Visit

## 2020-07-10 ENCOUNTER — Telehealth: Payer: Self-pay | Admitting: Pharmacist

## 2020-07-11 ENCOUNTER — Encounter: Payer: Self-pay | Admitting: Oncology

## 2020-07-11 ENCOUNTER — Other Ambulatory Visit: Payer: Self-pay

## 2020-07-11 ENCOUNTER — Inpatient Hospital Stay: Payer: Self-pay

## 2020-07-11 ENCOUNTER — Ambulatory Visit
Admission: RE | Admit: 2020-07-11 | Discharge: 2020-07-11 | Disposition: A | Discharge status: Home / Self Care | Payer: Self-pay | Source: Ambulatory Visit | Attending: Radiation Oncology | Admitting: Radiation Oncology

## 2020-07-11 ENCOUNTER — Inpatient Hospital Stay (HOSPITAL_BASED_OUTPATIENT_CLINIC_OR_DEPARTMENT_OTHER): Payer: Self-pay | Admitting: Oncology

## 2020-07-11 VITALS — BP 126/84 | HR 80 | Temp 97.7°F | Wt 189.7 lb

## 2020-07-11 DIAGNOSIS — C20 Malignant neoplasm of rectum: Secondary | ICD-10-CM

## 2020-07-11 LAB — CBC WITH DIFFERENTIAL/PLATELET
Abs Immature Granulocytes: 0.02 10*3/uL (ref 0.00–0.07)
Basophils Absolute: 0.1 10*3/uL (ref 0.0–0.1)
Basophils Relative: 1 %
Eosinophils Absolute: 0.5 10*3/uL (ref 0.0–0.5)
Eosinophils Relative: 5 %
HCT: 38.9 % — ABNORMAL LOW (ref 39.0–52.0)
Hemoglobin: 13.9 g/dL (ref 13.0–17.0)
Immature Granulocytes: 0 %
Lymphocytes Relative: 32 %
Lymphs Abs: 3 10*3/uL (ref 0.7–4.0)
MCH: 34.3 pg — ABNORMAL HIGH (ref 26.0–34.0)
MCHC: 35.7 g/dL (ref 30.0–36.0)
MCV: 96 fL (ref 80.0–100.0)
Monocytes Absolute: 0.6 10*3/uL (ref 0.1–1.0)
Monocytes Relative: 7 %
Neutro Abs: 5.2 10*3/uL (ref 1.7–7.7)
Neutrophils Relative %: 55 %
Platelets: 296 10*3/uL (ref 150–400)
RBC: 4.05 MIL/uL — ABNORMAL LOW (ref 4.22–5.81)
RDW: 12 % (ref 11.5–15.5)
WBC: 9.4 10*3/uL (ref 4.0–10.5)
nRBC: 0 % (ref 0.0–0.2)

## 2020-07-11 LAB — COMPREHENSIVE METABOLIC PANEL
ALT: 15 U/L (ref 0–44)
AST: 15 U/L (ref 15–41)
Albumin: 4.1 g/dL (ref 3.5–5.0)
Alkaline Phosphatase: 72 U/L (ref 38–126)
Anion gap: 9 (ref 5–15)
BUN: 12 mg/dL (ref 6–20)
CO2: 29 mmol/L (ref 22–32)
Calcium: 9.4 mg/dL (ref 8.9–10.3)
Chloride: 99 mmol/L (ref 98–111)
Creatinine, Ser: 0.97 mg/dL (ref 0.61–1.24)
GFR, Estimated: >60 mL/min (ref >60)
Glucose, Bld: 76 mg/dL (ref 70–99)
Potassium: 4.5 mmol/L (ref 3.5–5.1)
Sodium: 137 mmol/L (ref 135–145)
Total Bilirubin: 0.4 mg/dL (ref 0.3–1.2)
Total Protein: 7.2 g/dL (ref 6.5–8.1)

## 2020-07-11 NOTE — Progress Notes (Signed)
Patient here for follow up. Has gained 4 pounds since last visit.

## 2020-07-12 ENCOUNTER — Ambulatory Visit
Admission: RE | Admit: 2020-07-12 | Discharge: 2020-07-12 | Disposition: A | Discharge status: Home / Self Care | Payer: Self-pay | Source: Ambulatory Visit | Attending: Radiation Oncology | Admitting: Radiation Oncology

## 2020-07-12 LAB — CEA: CEA: 8 ng/mL — ABNORMAL HIGH (ref 0.0–4.7)

## 2020-07-13 ENCOUNTER — Ambulatory Visit
Admission: RE | Admit: 2020-07-13 | Discharge: 2020-07-13 | Disposition: A | Discharge status: Home / Self Care | Payer: Self-pay | Source: Ambulatory Visit | Attending: Radiation Oncology | Admitting: Radiation Oncology

## 2020-07-13 ENCOUNTER — Telehealth: Payer: Self-pay | Admitting: *Deleted

## 2020-07-13 MED ORDER — OXYCODONE HCL ER 10 MG PO T12A: 10.0000 mg | EXTENDED_RELEASE_TABLET | Freq: Three times a day (TID) | ORAL | 0 refills | Status: DC

## 2020-07-13 MED ORDER — SENNA 8.6 MG PO TABS: 1.0000 | ORAL_TABLET | Freq: Every day | ORAL | 3 refills | Status: AC

## 2020-07-13 NOTE — Telephone Encounter (Signed)
I spoke with patient and wife by phone. Patient endorses persistent rectal/abd pain improved some after starting OxyContin but he finds he is still taking 4-5 doses of Percocet each day/night to help with BTP. He feels the OxyContin is helping but wears off after 7-8 hours. Will plan to increase frequency of OxyContin 10mg  to Q8H. Continue Percocet PRN for BTP. We also discussed adjustment to his bowel regimen for constipation. I suggested that he add Senna daily with his Miralax.

## 2020-07-13 NOTE — Telephone Encounter (Signed)
Amy called requesting a return call from Sharion Dove, NP to discuss that fact that patient pain is not controlled and what adjustments can be made so that he is not in pain. [phone]

## 2020-07-14 ENCOUNTER — Other Ambulatory Visit: Payer: Self-pay | Admitting: *Deleted

## 2020-07-14 ENCOUNTER — Ambulatory Visit
Admission: RE | Admit: 2020-07-14 | Discharge: 2020-07-14 | Disposition: A | Discharge status: Home / Self Care | Payer: Self-pay | Source: Ambulatory Visit | Attending: Radiation Oncology | Admitting: Radiation Oncology

## 2020-07-14 MED ORDER — OXYCODONE HCL ER 10 MG PO T12A: 10.0000 mg | EXTENDED_RELEASE_TABLET | Freq: Three times a day (TID) | ORAL | 0 refills | Status: DC

## 2020-07-15 NOTE — Progress Notes (Deleted)
[place]  [phone] [phone]) [phone]  ID: Malik Lynch OB: 10/12/1970  MR#: [id number]  [id number]  Patient Care Team: Leonard Downing, MD as PCP - General (Family Medicine) [name], RN as Oncology Nurse Navigator  CHIEF COMPLAINT: Stage IIIb rectal cancer.  INTERVAL HISTORY: Patient returns to clinic today for further evaluation and initiation of capecitabine and XRT.  He continues to have pain, but otherwise feels well.  He has difficulty urinating.  He does not complain of any melena or hematochezia.  He has no neurologic complaints.  He denies any recent fevers or illnesses.  He has a fair appetite.  He has no chest pain, shortness of breath, cough, or hemoptysis.  He denies any nausea, vomiting, constipation, or diarrhea.  Patient offers no further specific complaints today.  REVIEW OF SYSTEMS:   Review of Systems  Constitutional: Positive for malaise/fatigue and weight loss. Negative for fever.  Respiratory: Negative.  Negative for cough, hemoptysis and shortness of breath.   Cardiovascular: Negative.  Negative for chest pain and leg swelling.  Gastrointestinal: Positive for abdominal pain. Negative for blood in stool and melena.  Genitourinary: Positive for urgency.  Musculoskeletal: Negative.  Negative for back pain.  Skin: Negative.  Negative for rash.  Neurological: Positive for weakness. Negative for dizziness, focal weakness and headaches.  Psychiatric/Behavioral: Negative.  The patient is not nervous/anxious.     As per HPI. Otherwise, a complete review of systems is negative.  PAST MEDICAL HISTORY: No past medical history on file.  PAST SURGICAL HISTORY: No past surgical history on file.  FAMILY HISTORY: Family History  Problem Relation Age of Onset  . Cancer Paternal Grandfather     ADVANCED DIRECTIVES (Y/N):  N  HEALTH MAINTENANCE: Social History   Tobacco Use  . Smoking status: Current  Some Day Smoker    Packs/day: 1.00    Types: Cigarettes  Vaping Use  . Vaping Use: Never used  Substance Use Topics  . Alcohol use: Yes    Alcohol/week: 1.0 standard drink    Types: 1 Cans of beer per week  . Drug use: No     Colonoscopy:  PAP:  Bone density:  Lipid panel:  No Known Allergies  Current Outpatient Medications  Medication Sig Dispense Refill  . ALPRAZolam (XANAX) 0.5 MG tablet Take 1 tablet (0.5 mg total) by mouth 2 (two) times daily as needed for anxiety. 60 tablet 0  . capecitabine (XELODA) 150 MG tablet Take 1 tablet (150 mg total) by mouth 2 (two) times daily after a meal. Take Monday through Friday during radiation. Take only on days of radiation. 60 tablet 0  . capecitabine (XELODA) 500 MG tablet Take 3 tablets (1,500 mg total) by mouth 2 (two) times daily after a meal. Take Monday through Friday during radiation. Take only on days of radiation. 180 tablet 0  . ibuprofen (ADVIL,MOTRIN) 100 MG tablet Take 800 mg by mouth every 6 (six) hours as needed.    . ondansetron (ZOFRAN) 8 MG tablet Take 1 tablet (8 mg total) by mouth every 8 (eight) hours as needed for nausea or vomiting. 45 tablet 0  . ondansetron (ZOFRAN) 8 MG tablet Take 1 tablet (8 mg total) by mouth 2 (two) times daily as needed (Nausea or vomiting). 60 tablet 1  . oxyCODONE (OXYCONTIN) 10 mg 12 hr tablet Take 1 tablet (10 mg total) by mouth every 8 (eight) hours. 90 tablet 0  . oxyCODONE-acetaminophen (PERCOCET/ROXICET) 5-325 MG tablet Take  1-2 tablets by mouth every 4 (four) hours as needed for severe pain. 60 tablet 0  . polyethylene glycol (MIRALAX / GLYCOLAX) 17 g packet Take 17 g by mouth daily.    . prochlorperazine (COMPAZINE) 10 MG tablet Take 1 tablet (10 mg total) by mouth every 6 (six) hours as needed (Nausea or vomiting). 60 tablet 1  . senna (SENOKOT) 8.6 MG TABS tablet Take 1 tablet (8.6 mg total) by mouth daily. 120 tablet 3  . tamsulosin (FLOMAX) 0.4 MG CAPS capsule Take 0.4 mg by mouth  daily.    . traZODone (DESYREL) 150 MG tablet Take 1 tablet (150 mg total) by mouth at bedtime. 30 tablet 1   No current facility-administered medications for this visit.    OBJECTIVE: There were no vitals filed for this visit.   There is no height or weight on file to calculate BMI.    ECOG FS:0 - Asymptomatic  General: Well-developed, well-nourished, no acute distress. Eyes: Pink conjunctiva, anicteric sclera. HEENT: Normocephalic, moist mucous membranes. Lungs: No audible wheezing or coughing. Heart: Regular rate and rhythm. Abdomen: Soft, nontender, no obvious distention. Musculoskeletal: No edema, cyanosis, or clubbing. Neuro: Alert, answering all questions appropriately. Cranial nerves grossly intact. Skin: No rashes or petechiae noted. Psych: Normal affect.  LAB RESULTS:  Lab Results  Component Value Date   NA 137 07/11/2020   K 4.5 07/11/2020   CL 99 07/11/2020   CO2 29 07/11/2020   GLUCOSE 76 07/11/2020   BUN 12 07/11/2020   CREATININE 0.97 07/11/2020   CALCIUM 9.4 07/11/2020   PROT 7.2 07/11/2020   ALBUMIN 4.1 07/11/2020   AST 15 07/11/2020   ALT 15 07/11/2020   ALKPHOS 72 07/11/2020   BILITOT 0.4 07/11/2020   GFRNONAA >60 07/11/2020    Lab Results  Component Value Date   WBC 9.4 07/11/2020   NEUTROABS 5.2 07/11/2020   HGB 13.9 07/11/2020   HCT 38.9 (L) 07/11/2020   MCV 96.0 07/11/2020   PLT 296 07/11/2020     STUDIES: NM PET Image Initial (PI) Skull Base To Thigh  Result Date: 06/28/2020 CLINICAL DATA:  Initial treatment strategy for rectal cancer. EXAM: NUCLEAR MEDICINE PET SKULL BASE TO THIGH TECHNIQUE: 9.965 mCi F-18 FDG was injected intravenously. Full-ring PET imaging was performed from the skull base to thigh after the radiotracer. CT data was obtained and used for attenuation correction and anatomic localization. Fasting blood glucose: 94 mg/dl COMPARISON:  CT scan 05/11/2020 FINDINGS: Mediastinal blood pool activity: SUV max 1.94 Liver  activity: SUV max NA NECK: No hypermetabolic lymph nodes in the neck. Incidental CT findings: none CHEST: No hypermetabolic mediastinal or hilar nodes. No suspicious pulmonary nodules on the CT scan. Minimal FDG activity noted in the distal esophagus likely due to mild inflammations/reflux esophagitis. No esophageal lesion is identified. Incidental CT findings: Age advanced coronary artery calcifications are noted mainly in the LAD. ABDOMEN/PELVIS: Fairly large rectal mass is markedly hypermetabolic with SUV max of 28.31. No findings suspicious for involvement of the mesorectum. No mesorectal adenopathy. No enlarged or hypermetabolic lymph nodes in the sigmoid mesocolon or retroperitoneum. No findings suspicious for hepatic metastatic disease. The lower pole left renal mass is not hypermetabolic but this is not atypical and still quite suspicious for a renal cell neoplasm. Incidental CT findings: Scattered vascular calcifications. SKELETON: No focal hypermetabolic activity to suggest skeletal metastasis. Incidental CT findings: none IMPRESSION: 1. Markedly hypermetabolic large rectal mass consistent with known rectal carcinoma. No findings suspicious for locoregional adenopathy or  hepatic metastatic disease. 2. The lower pole left renal mass is not hypermetabolic but still highly suspicious for renal neoplasm. Recommend urology consultation. Electronically Signed   By: Marijo Sanes M.D.   On: 06/28/2020 15:44    ASSESSMENT: Stage IIIb rectal cancer.  PLAN:    1.  Stage IIIb rectal cancer: Pathology and imaging results reviewed independently.  EUS completed at outside facility confirmed stage of disease.  PET scan results from June 28, 2020 reviewed independently and reported as above with no obvious malignancy outside of patient's known rectal mass.  He will benefit from neoadjuvant chemotherapy using capecitabine along with daily XRT.  Patient's dose of capecitabine is 1650 mg daily 5 days a week along  with XRT.  Proceed with treatment today.  Return to clinic daily for XRT and then in 1 week for repeat laboratory work and further evaluation.  Patient has both genetics and dietary appointment scheduled in the near future.  Appreciate clinical pharmacy input. 2.  Weight loss: Referral to dietary as above. 3.  Pain: Continue Percocet.  Patient does not wish to change his narcotics at this time. 4.  Left kidney mass: 2.8 x 3.6 x 3.4 mass highly concerning for a second malignancy.  Patient will likely have to undergo partial nephrectomy at the time of his rectal surgery.   I spent a total of 30 minutes reviewing chart data, face-to-face evaluation with the patient, counseling and coordination of care as detailed above.   Patient expressed understanding and was in agreement with this plan. He also understands that He can call clinic at any time with any questions, concerns, or complaints.   Cancer Staging Rectal cancer [facility]) Staging form: Colon and Rectum, AJCC 8th Edition - Clinical stage from 06/22/2020: Stage IIIB (cT3, cN1a, cM0) - Signed by Lloyd Huger, MD on 06/22/2020   Lloyd Huger, MD   07/15/2020 7:45 AM

## 2020-07-17 ENCOUNTER — Ambulatory Visit
Admission: RE | Admit: 2020-07-17 | Discharge: 2020-07-17 | Disposition: A | Discharge status: Home / Self Care | Payer: Self-pay | Source: Ambulatory Visit | Attending: Radiation Oncology | Admitting: Radiation Oncology

## 2020-07-17 ENCOUNTER — Other Ambulatory Visit: Payer: Self-pay | Admitting: Hospice and Palliative Medicine

## 2020-07-17 LAB — SLIDE CONSULT, PATHOLOGY ARMC

## 2020-07-17 MED ORDER — OXYCODONE-ACETAMINOPHEN 5-325 MG PO TABS
1.0000 | ORAL_TABLET | ORAL | 0 refills | Status: DC | PRN
Start: 2020-07-17 — End: 2020-08-09

## 2020-07-17 MED ORDER — CYCLOBENZAPRINE HCL 10 MG PO TABS: 10.0000 mg | ORAL_TABLET | Freq: Three times a day (TID) | ORAL | 0 refills | Status: DC | PRN

## 2020-07-17 MED ORDER — ALPRAZOLAM 0.5 MG PO TABS
0.5000 mg | ORAL_TABLET | Freq: Two times a day (BID) | ORAL | 0 refills | Status: DC | PRN
Start: 2020-07-17 — End: 2020-07-31

## 2020-07-17 NOTE — Progress Notes (Signed)
I spoke with patient by phone. He requested refills of the Percocet and alprazolam. He says he has also tried a muscle relaxer given to him by his wife and that it helped the pain significantly. Okay to start flexeril 10mg  TID PRN  PDMP reviewed.

## 2020-07-18 ENCOUNTER — Ambulatory Visit
Admission: RE | Admit: 2020-07-18 | Discharge: 2020-07-18 | Disposition: A | Discharge status: Home / Self Care | Payer: Self-pay | Source: Ambulatory Visit | Attending: Radiation Oncology | Admitting: Radiation Oncology

## 2020-07-18 ENCOUNTER — Inpatient Hospital Stay: Payer: Self-pay

## 2020-07-18 ENCOUNTER — Inpatient Hospital Stay (HOSPITAL_BASED_OUTPATIENT_CLINIC_OR_DEPARTMENT_OTHER): Payer: Self-pay | Admitting: Oncology

## 2020-07-18 ENCOUNTER — Encounter: Payer: Self-pay | Admitting: Oncology

## 2020-07-18 ENCOUNTER — Encounter: Payer: Self-pay | Admitting: Pharmacist

## 2020-07-18 ENCOUNTER — Inpatient Hospital Stay: Payer: Self-pay | Admitting: Oncology

## 2020-07-18 ENCOUNTER — Other Ambulatory Visit: Payer: Self-pay

## 2020-07-18 VITALS — BP 120/81 | HR 75 | Temp 97.8°F | Resp 20 | Wt 193.1 lb

## 2020-07-18 DIAGNOSIS — C20 Malignant neoplasm of rectum: Secondary | ICD-10-CM

## 2020-07-18 LAB — PHOSPHORUS: Phosphorus: 4.1 mg/dL (ref 2.5–4.6)

## 2020-07-18 LAB — CBC WITH DIFFERENTIAL/PLATELET
Abs Immature Granulocytes: 0.05 10*3/uL (ref 0.00–0.07)
Basophils Absolute: 0.1 10*3/uL (ref 0.0–0.1)
Basophils Relative: 1 %
Eosinophils Absolute: 0.3 10*3/uL (ref 0.0–0.5)
Eosinophils Relative: 4 %
HCT: 38.4 % — ABNORMAL LOW (ref 39.0–52.0)
Hemoglobin: 13.7 g/dL (ref 13.0–17.0)
Immature Granulocytes: 1 %
Lymphocytes Relative: 21 %
Lymphs Abs: 1.5 10*3/uL (ref 0.7–4.0)
MCH: 34 pg (ref 26.0–34.0)
MCHC: 35.7 g/dL (ref 30.0–36.0)
MCV: 95.3 fL (ref 80.0–100.0)
Monocytes Absolute: 0.4 10*3/uL (ref 0.1–1.0)
Monocytes Relative: 6 %
Neutro Abs: 4.7 10*3/uL (ref 1.7–7.7)
Neutrophils Relative %: 67 %
Platelets: 278 10*3/uL (ref 150–400)
RBC: 4.03 MIL/uL — ABNORMAL LOW (ref 4.22–5.81)
RDW: 11.9 % (ref 11.5–15.5)
WBC: 7.1 10*3/uL (ref 4.0–10.5)
nRBC: 0 % (ref 0.0–0.2)

## 2020-07-18 LAB — COMPREHENSIVE METABOLIC PANEL
ALT: 18 U/L (ref 0–44)
AST: 15 U/L (ref 15–41)
Albumin: 4 g/dL (ref 3.5–5.0)
Alkaline Phosphatase: 63 U/L (ref 38–126)
Anion gap: 6 (ref 5–15)
BUN: 10 mg/dL (ref 6–20)
CO2: 28 mmol/L (ref 22–32)
Calcium: 9.1 mg/dL (ref 8.9–10.3)
Chloride: 101 mmol/L (ref 98–111)
Creatinine, Ser: 0.81 mg/dL (ref 0.61–1.24)
GFR, Estimated: >60 mL/min (ref >60)
Glucose, Bld: 102 mg/dL — ABNORMAL HIGH (ref 70–99)
Potassium: 4.1 mmol/L (ref 3.5–5.1)
Sodium: 135 mmol/L (ref 135–145)
Total Bilirubin: 0.3 mg/dL (ref 0.3–1.2)
Total Protein: 7.2 g/dL (ref 6.5–8.1)

## 2020-07-18 LAB — MAGNESIUM: Magnesium: 2 mg/dL (ref 1.7–2.4)

## 2020-07-18 NOTE — Progress Notes (Signed)
Patient states he has stopped trazodone due to it making his mouth dry.  Patient states he is still having trouble urinating. Patient was in extreme pain last night. Patient states pain worsen at night. Patient states he feels like some swelling has went down.

## 2020-07-18 NOTE — Progress Notes (Signed)
[facility]  [phone]) [phone] [phone]) [phone]  ID: COYLE STORDAHL OB: 04-06-1971  MR#: [id number]  XTK#:[id number]  Patient Care Team: Leonard Downing, MD as PCP - General (Family Medicine) [name], RN as Oncology Nurse Navigator  CHIEF COMPLAINT: Stage IIIb rectal cancer.  INTERVAL HISTORY: Patient returns to clinic today for repeat laboratory work, further evaluation, and continuation of capecitabine and XRT.  He is tolerating his treatments well without significant side effects.  He continues to have pain but feels this is improved.  He also complains of periodic urinary retention.  He denies any melena or hematochezia.  He has no neurologic complaints.  He denies any recent fevers or illnesses.  He has a fair appetite.  He has no chest pain, shortness of breath, cough, or hemoptysis.  He denies any nausea, vomiting, constipation, or diarrhea.  Patient offers no further specific complaints today.  REVIEW OF SYSTEMS:   Review of Systems  Constitutional: Positive for malaise/fatigue and weight loss. Negative for fever.  Respiratory: Negative.  Negative for cough, hemoptysis and shortness of breath.   Cardiovascular: Negative.  Negative for chest pain and leg swelling.  Gastrointestinal: Positive for abdominal pain. Negative for blood in stool and melena.  Genitourinary: Positive for urgency.  Musculoskeletal: Negative.  Negative for back pain.  Skin: Negative.  Negative for rash.  Neurological: Positive for weakness. Negative for dizziness, focal weakness and headaches.  Psychiatric/Behavioral: Negative.  The patient is not nervous/anxious.     As per HPI. Otherwise, a complete review of systems is negative.  PAST MEDICAL HISTORY: History reviewed. No pertinent past medical history.  PAST SURGICAL HISTORY: History reviewed. No pertinent surgical history.  FAMILY HISTORY: Family History  Problem Relation Age of Onset  . Cancer  Paternal Grandfather     ADVANCED DIRECTIVES (Y/N):  N  HEALTH MAINTENANCE: Social History   Tobacco Use  . Smoking status: Current Some Day Smoker    Packs/day: 1.00    Types: Cigarettes  Vaping Use  . Vaping Use: Never used  Substance Use Topics  . Alcohol use: Yes    Alcohol/week: 1.0 standard drink    Types: 1 Cans of beer per week  . Drug use: No     Colonoscopy:  PAP:  Bone density:  Lipid panel:  No Known Allergies  Current Outpatient Medications  Medication Sig Dispense Refill  . ALPRAZolam (XANAX) 0.5 MG tablet Take 1 tablet (0.5 mg total) by mouth 2 (two) times daily as needed for anxiety. 60 tablet 0  . capecitabine (XELODA) 150 MG tablet Take 1 tablet (150 mg total) by mouth 2 (two) times daily after a meal. Take Monday through Friday during radiation. Take only on days of radiation. 60 tablet 0  . capecitabine (XELODA) 500 MG tablet Take 3 tablets (1,500 mg total) by mouth 2 (two) times daily after a meal. Take Monday through Friday during radiation. Take only on days of radiation. 180 tablet 0  . cyclobenzaprine (FLEXERIL) 10 MG tablet Take 1 tablet (10 mg total) by mouth 3 (three) times daily as needed for muscle spasms. 30 tablet 0  . ibuprofen (ADVIL,MOTRIN) 100 MG tablet Take 800 mg by mouth every 6 (six) hours as needed.    . ondansetron (ZOFRAN) 8 MG tablet Take 1 tablet (8 mg total) by mouth every 8 (eight) hours as needed for nausea or vomiting. 45 tablet 0  . ondansetron (ZOFRAN) 8 MG tablet Take 1 tablet (8 mg total) by mouth 2 (  two) times daily as needed (Nausea or vomiting). 60 tablet 1  . oxyCODONE (OXYCONTIN) 10 mg 12 hr tablet Take 1 tablet (10 mg total) by mouth every 8 (eight) hours. 90 tablet 0  . oxyCODONE-acetaminophen (PERCOCET/ROXICET) 5-325 MG tablet Take 1-2 tablets by mouth every 4 (four) hours as needed for severe pain. 60 tablet 0  . polyethylene glycol (MIRALAX / GLYCOLAX) 17 g packet Take 17 g by mouth daily.    . prochlorperazine  (COMPAZINE) 10 MG tablet Take 1 tablet (10 mg total) by mouth every 6 (six) hours as needed (Nausea or vomiting). 60 tablet 1  . senna (SENOKOT) 8.6 MG TABS tablet Take 1 tablet (8.6 mg total) by mouth daily. 120 tablet 3  . tamsulosin (FLOMAX) 0.4 MG CAPS capsule Take 0.4 mg by mouth daily.    . traZODone (DESYREL) 150 MG tablet Take 1 tablet (150 mg total) by mouth at bedtime. (Patient not taking: Reported on 07/18/2020) 30 tablet 1   No current facility-administered medications for this visit.    OBJECTIVE: Vitals:   07/18/20 1057  BP: 120/81  Pulse: 75  Resp: 20  Temp: 97.8 F (36.6 C)  SpO2: 100%     Body mass index is 26.06 kg/m.    ECOG FS:0 - Asymptomatic  General: Well-developed, well-nourished, no acute distress. Eyes: Pink conjunctiva, anicteric sclera. HEENT: Normocephalic, moist mucous membranes. Lungs: No audible wheezing or coughing. Heart: Regular rate and rhythm. Abdomen: Soft, nontender, no obvious distention. Musculoskeletal: No edema, cyanosis, or clubbing. Neuro: Alert, answering all questions appropriately. Cranial nerves grossly intact. Skin: No rashes or petechiae noted. Psych: Normal affect.  LAB RESULTS:  Lab Results  Component Value Date   NA 135 07/18/2020   K 4.1 07/18/2020   CL 101 07/18/2020   CO2 28 07/18/2020   GLUCOSE 102 (H) 07/18/2020   BUN 10 07/18/2020   CREATININE 0.81 07/18/2020   CALCIUM 9.1 07/18/2020   PROT 7.2 07/18/2020   ALBUMIN 4.0 07/18/2020   AST 15 07/18/2020   ALT 18 07/18/2020   ALKPHOS 63 07/18/2020   BILITOT 0.3 07/18/2020   GFRNONAA >60 07/18/2020    Lab Results  Component Value Date   WBC 7.1 07/18/2020   NEUTROABS 4.7 07/18/2020   HGB 13.7 07/18/2020   HCT 38.4 (L) 07/18/2020   MCV 95.3 07/18/2020   PLT 278 07/18/2020     STUDIES: NM PET Image Initial (PI) Skull Base To Thigh  Result Date: 06/28/2020 CLINICAL DATA:  Initial treatment strategy for rectal cancer. EXAM: NUCLEAR MEDICINE PET SKULL  BASE TO THIGH TECHNIQUE: 9.965 mCi F-18 FDG was injected intravenously. Full-ring PET imaging was performed from the skull base to thigh after the radiotracer. CT data was obtained and used for attenuation correction and anatomic localization. Fasting blood glucose: 94 mg/dl COMPARISON:  CT scan 05/11/2020 FINDINGS: Mediastinal blood pool activity: SUV max 1.94 Liver activity: SUV max NA NECK: No hypermetabolic lymph nodes in the neck. Incidental CT findings: none CHEST: No hypermetabolic mediastinal or hilar nodes. No suspicious pulmonary nodules on the CT scan. Minimal FDG activity noted in the distal esophagus likely due to mild inflammations/reflux esophagitis. No esophageal lesion is identified. Incidental CT findings: Age advanced coronary artery calcifications are noted mainly in the LAD. ABDOMEN/PELVIS: Fairly large rectal mass is markedly hypermetabolic with SUV max of 123XX123. No findings suspicious for involvement of the mesorectum. No mesorectal adenopathy. No enlarged or hypermetabolic lymph nodes in the sigmoid mesocolon or retroperitoneum. No findings suspicious for hepatic metastatic  disease. The lower pole left renal mass is not hypermetabolic but this is not atypical and still quite suspicious for a renal cell neoplasm. Incidental CT findings: Scattered vascular calcifications. SKELETON: No focal hypermetabolic activity to suggest skeletal metastasis. Incidental CT findings: none IMPRESSION: 1. Markedly hypermetabolic large rectal mass consistent with known rectal carcinoma. No findings suspicious for locoregional adenopathy or hepatic metastatic disease. 2. The lower pole left renal mass is not hypermetabolic but still highly suspicious for renal neoplasm. Recommend urology consultation. Electronically Signed   By: Marijo Sanes M.D.   On: 06/28/2020 15:44    ASSESSMENT: Stage IIIb rectal cancer.  PLAN:    1.  Stage IIIb rectal cancer: Pathology and imaging results reviewed independently.   EUS completed at outside facility confirmed stage of disease.  PET scan results from June 28, 2020 reviewed independently and reported as above with no obvious malignancy outside of patient's known rectal mass.  He will benefit from neoadjuvant chemotherapy using capecitabine along with daily XRT.  Patient's dose of capecitabine is 1650 mg daily 5 days a week along with XRT.  Continue with daily treatment.  Return to clinic in 1 week for repeat laboratory work and further evaluation.  Patient has both genetics and dietary appointment scheduled in the near future.  Appreciate clinical pharmacy input. 2.  Weight loss: Referral to dietary as above. 3.  Pain: Continue Percocet.  Patient does not wish to change his narcotics at this time. 4.  Left kidney mass: 2.8 x 3.6 x 3.4 mass highly concerning for a second malignancy.  Patient will likely have to undergo partial nephrectomy at the time of his rectal surgery. 5.  Urinary retention: We discussed the possibility of a referral back to urology for catheterization, but patient has declined.  He will call clinic over the next week if his symptoms worsen or he changes his mind. 6.  Constipation: Continue stool softener and motility agent as needed.   Patient expressed understanding and was in agreement with this plan. He also understands that He can call clinic at any time with any questions, concerns, or complaints.   Cancer Staging Rectal cancer [facility]) Staging form: Colon and Rectum, AJCC 8th Edition - Clinical stage from 06/22/2020: Stage IIIB (cT3, cN1a, cM0) - Signed by Lloyd Huger, MD on 06/22/2020   Lloyd Huger, MD   07/18/2020 1:07 PM

## 2020-07-18 NOTE — Telephone Encounter (Signed)
Oral Chemotherapy Pharmacist Encounter   Met with patient down in radiation on 07/10/20 for Xeloda re-education. Reviewed the dosing schedule for the Xeloda 534m and 1570mtablets. Provided patient with AM/PM Pill tray. Filled this tray during the visit to cover his first week of radiation which will start 07/12/19.  Wife brought in some supplements she was thinking about having Mr. Mcknight take and a lotion for him to use with the Xeloda. Reviewed the supplement. Suggested again the supplement for immune support and one containing folic acid. The lotion is okay to use if it keeps his hands moisturized.   AlDarl PikesPharmD, BCPS, BCOP, CPP Hematology/Oncology Clinical Pharmacist [id number] 61681/18/2022 11:33 AM

## 2020-07-19 ENCOUNTER — Telehealth: Payer: Self-pay | Admitting: *Deleted

## 2020-07-19 ENCOUNTER — Ambulatory Visit
Admission: RE | Admit: 2020-07-19 | Discharge: 2020-07-19 | Disposition: A | Discharge status: Home / Self Care | Payer: Self-pay | Source: Ambulatory Visit | Attending: Radiation Oncology | Admitting: Radiation Oncology

## 2020-07-19 ENCOUNTER — Inpatient Hospital Stay (HOSPITAL_BASED_OUTPATIENT_CLINIC_OR_DEPARTMENT_OTHER): Payer: Self-pay | Admitting: Hospice and Palliative Medicine

## 2020-07-19 ENCOUNTER — Other Ambulatory Visit: Payer: Self-pay | Admitting: *Deleted

## 2020-07-19 DIAGNOSIS — C20 Malignant neoplasm of rectum: Secondary | ICD-10-CM

## 2020-07-19 NOTE — Progress Notes (Signed)
[facility]  [phone] [phone]  Patient Care Team: Konstantina Nachreiner Downing, MD as PCP - General (Family Medicine) [name], RN as Oncology Nurse Navigator   Name of the patient: Malik Lynch  [id number]  05-27-1971   Date of visit: 07/19/20  HPI: Patient is a 50 y.o. male with stage IIIb rectal cancer. Planned treatment of Xeloda (capecitabine) and XRT.  Reason for Consult: Oral chemotherapy follow-up for capecitabine therapy.   PAST MEDICAL HISTORY:No past medical history on file.  PAST SURGICAL HISTORY: No past surgical history on file.  HEMATOLOGY/ONCOLOGY HISTORY:  Oncology History  Rectal cancer ([place])  06/22/2020 Initial Diagnosis   Rectal cancer ([place])   06/22/2020 Cancer Staging   Staging form: Colon and Rectum, AJCC 8th Edition - Clinical stage from 06/22/2020: Stage IIIB (cT3, cN1a, cM0) - Signed by Lloyd Huger, MD on 06/22/2020   06/22/2020 -  Chemotherapy   The patient had capecitabine (XELODA) 150 MG tablet, 150 mg (100 % of original dose 150 mg), Oral, 2 times daily after meals, 0 of 1 cycle, Start date: 06/29/2020, End date: -- Dose modification: 150 mg (original dose 150 mg, Cycle 1) capecitabine (XELODA) 500 MG tablet, 1,500 mg (100 % of original dose 1,500 mg), Oral, 2 times daily after meals, 0 of 1 cycle, Start date: 06/29/2020, End date: -- Dose modification: 1,500 mg (original dose 1,500 mg, Cycle 1)  for chemotherapy treatment.      ALLERGIES:  has No Known Allergies.  MEDICATIONS:  Current Outpatient Medications  Medication Sig Dispense Refill  . ALPRAZolam (XANAX) 0.5 MG tablet Take 1 tablet (0.5 mg total) by mouth 2 (two) times daily as needed for anxiety. 60 tablet 0  . capecitabine (XELODA) 150 MG tablet Take 1 tablet (150 mg total) by mouth 2 (two) times daily after a meal. Take Monday through Friday during radiation. Take only on days of radiation. 60  tablet 0  . capecitabine (XELODA) 500 MG tablet Take 3 tablets (1,500 mg total) by mouth 2 (two) times daily after a meal. Take Monday through Friday during radiation. Take only on days of radiation. 180 tablet 0  . cyclobenzaprine (FLEXERIL) 10 MG tablet Take 1 tablet (10 mg total) by mouth 3 (three) times daily as needed for muscle spasms. 30 tablet 0  . ibuprofen (ADVIL,MOTRIN) 100 MG tablet Take 800 mg by mouth every 6 (six) hours as needed.    . ondansetron (ZOFRAN) 8 MG tablet Take 1 tablet (8 mg total) by mouth every 8 (eight) hours as needed for nausea or vomiting. 45 tablet 0  . ondansetron (ZOFRAN) 8 MG tablet Take 1 tablet (8 mg total) by mouth 2 (two) times daily as needed (Nausea or vomiting). 60 tablet 1  . oxyCODONE (OXYCONTIN) 10 mg 12 hr tablet Take 1 tablet (10 mg total) by mouth every 8 (eight) hours. 90 tablet 0  . oxyCODONE-acetaminophen (PERCOCET/ROXICET) 5-325 MG tablet Take 1-2 tablets by mouth every 4 (four) hours as needed for severe pain. 60 tablet 0  . polyethylene glycol (MIRALAX / GLYCOLAX) 17 g packet Take 17 g by mouth daily.    . prochlorperazine (COMPAZINE) 10 MG tablet Take 1 tablet (10 mg total) by mouth every 6 (six) hours as needed (Nausea or vomiting). 60 tablet 1  . senna (SENOKOT) 8.6 MG TABS tablet Take 1 tablet (8.6 mg total) by mouth daily. 120 tablet 3  . tamsulosin (FLOMAX) 0.4 MG CAPS capsule Take 0.4 mg  by mouth daily.    . traZODone (DESYREL) 150 MG tablet Take 1 tablet (150 mg total) by mouth at bedtime. (Patient not taking: Reported on 07/18/2020) 30 tablet 1   No current facility-administered medications for this visit.    VITAL SIGNS: There were no vitals taken for this visit. There were no vitals filed for this visit.  Estimated body mass index is 26.06 kg/m as calculated from the following:   Height as of 06/29/20: 6' 0.18" (1.833 m).   Weight as of an earlier encounter on 07/18/20: 87.6 kg (193 lb 1.6 oz).  LABS: CBC:    Component  Value Date/Time   WBC 7.1 07/18/2020 1003   HGB 13.7 07/18/2020 1003   HCT 38.4 (L) 07/18/2020 1003   PLT 278 07/18/2020 1003   MCV 95.3 07/18/2020 1003   NEUTROABS 4.7 07/18/2020 1003   LYMPHSABS 1.5 07/18/2020 1003   MONOABS 0.4 07/18/2020 1003   EOSABS 0.3 07/18/2020 1003   BASOSABS 0.1 07/18/2020 1003   Comprehensive Metabolic Panel:    Component Value Date/Time   NA 135 07/18/2020 1003   K 4.1 07/18/2020 1003   CL 101 07/18/2020 1003   CO2 28 07/18/2020 1003   BUN 10 07/18/2020 1003   CREATININE 0.81 07/18/2020 1003   GLUCOSE 102 (H) 07/18/2020 1003   CALCIUM 9.1 07/18/2020 1003   AST 15 07/18/2020 1003   ALT 18 07/18/2020 1003   ALKPHOS 63 07/18/2020 1003   BILITOT 0.3 07/18/2020 1003   PROT 7.2 07/18/2020 1003   ALBUMIN 4.0 07/18/2020 1003    RADIOGRAPHIC STUDIES: NM PET Image Initial (PI) Skull Base To Thigh  Result Date: 06/28/2020 CLINICAL DATA:  Initial treatment strategy for rectal cancer. EXAM: NUCLEAR MEDICINE PET SKULL BASE TO THIGH TECHNIQUE: 9.965 mCi F-18 FDG was injected intravenously. Full-ring PET imaging was performed from the skull base to thigh after the radiotracer. CT data was obtained and used for attenuation correction and anatomic localization. Fasting blood glucose: 94 mg/dl COMPARISON:  CT scan 05/11/2020 FINDINGS: Mediastinal blood pool activity: SUV max 1.94 Liver activity: SUV max NA NECK: No hypermetabolic lymph nodes in the neck. Incidental CT findings: none CHEST: No hypermetabolic mediastinal or hilar nodes. No suspicious pulmonary nodules on the CT scan. Minimal FDG activity noted in the distal esophagus likely due to mild inflammations/reflux esophagitis. No esophageal lesion is identified. Incidental CT findings: Age advanced coronary artery calcifications are noted mainly in the LAD. ABDOMEN/PELVIS: Fairly large rectal mass is markedly hypermetabolic with SUV max of 123XX123. No findings suspicious for involvement of the mesorectum. No  mesorectal adenopathy. No enlarged or hypermetabolic lymph nodes in the sigmoid mesocolon or retroperitoneum. No findings suspicious for hepatic metastatic disease. The lower pole left renal mass is not hypermetabolic but this is not atypical and still quite suspicious for a renal cell neoplasm. Incidental CT findings: Scattered vascular calcifications. SKELETON: No focal hypermetabolic activity to suggest skeletal metastasis. Incidental CT findings: none IMPRESSION: 1. Markedly hypermetabolic large rectal mass consistent with known rectal carcinoma. No findings suspicious for locoregional adenopathy or hepatic metastatic disease. 2. The lower pole left renal mass is not hypermetabolic but still highly suspicious for renal neoplasm. Recommend urology consultation. Electronically Signed   By: Marijo Sanes M.D.   On: 06/28/2020 15:44     Assessment and Plan-  Continue Xeloda with radiation.   Oral Chemotherapy Side Effect/Intolerance: occasional nausea, he used his on hand anti-nausea medication and that handled his nausea. He as reports fatigue but his is  not new for him.  Oral Chemotherapy Adherence: no missed doses reported, they are continuing to use the pill tray.  Medication Access Issues: none  No patient barriers to medication adherence identified.   Patient expressed understanding and was in agreement with this plan. He also understands that He can call clinic at any time with any questions, concerns, or complaints.   Thank you for allowing me to participate in the care of this very pleasant patient.   Time Total: 10 mins  Visit consisted of counseling and education on dealing with issues of symptom management in the setting of serious and potentially life-threatening illness.Greater than 50%  of this time was spent counseling and coordinating care related to the above assessment and plan.  Signed by: Darl Pikes, PharmD, BCPS, BCOP, CPP Hematology/Oncology Clinical Pharmacist  Practitioner [facility] [phone]

## 2020-07-19 NOTE — Progress Notes (Signed)
Multidisciplinary Oncology Council Documentation  Malik Lynch was presented by our [facility] on 07/19/2020, which included representatives from:  . Palliative Care . Dietitian . Physical/Occupational Therapist . Speech Therapist . Nurse Navigator . Genetics . Social work . Hotel manager . Research RN . [facility] . Survivorship Nurse  Nareg currently presents with history of stage III CRC  We reviewed previous medical and familial history, history of present illness, and recent lab results along with all available histopathologic and imaging studies. The [place] considered available treatment options and made the following recommendations/referrals:  Consider referrals for PT and RD. Palliative care and SW already following.   The MOC is a meeting of clinicians from various specialty areas who evaluate and discuss patients for whom a multidisciplinary approach is being considered. Final determinations in the plan of care are those of the provider(s).   Today's extended care, comprehensive team conference, Viggo was not present for the discussion and was not examined.

## 2020-07-19 NOTE — Telephone Encounter (Signed)
Pt decline to get covid vaccine at this time. Pt was given callback number [phone]

## 2020-07-20 ENCOUNTER — Ambulatory Visit
Admission: RE | Admit: 2020-07-20 | Discharge: 2020-07-20 | Disposition: A | Discharge status: Home / Self Care | Payer: Self-pay | Source: Ambulatory Visit | Attending: Radiation Oncology | Admitting: Radiation Oncology

## 2020-07-21 ENCOUNTER — Ambulatory Visit
Admission: RE | Admit: 2020-07-21 | Discharge: 2020-07-21 | Disposition: A | Discharge status: Home / Self Care | Payer: Self-pay | Source: Ambulatory Visit | Attending: Radiation Oncology | Admitting: Radiation Oncology

## 2020-07-22 NOTE — Progress Notes (Signed)
Malik Lynch  [phone]) [phone] [phone]) [phone]  ID: Malik Lynch OB: [birth year]  MR#: [id number]  DUK#:[id number]  Patient Care Team: Leonard Downing, MD as PCP - General (Family Medicine) [name], RN as Oncology Nurse Navigator  CHIEF COMPLAINT: Stage IIIb rectal cancer.  INTERVAL HISTORY: Patient returns to clinic today for repeat laboratory work, further evaluation, and continuation of capecitabine and XRT.  He continues to tolerate his treatments relatively well.  He continues to have pain, but this is well controlled on his current medications.  He continues to have issues with urination that causes insomnia.  His constipation has improved.  He denies any melena or hematochezia.  He has no neurologic complaints.  He denies any recent fevers or illnesses.  He has a fair appetite.  He has no chest pain, shortness of breath, cough, or hemoptysis.  He denies any nausea, vomiting, or diarrhea.  Patient offers no further specific complaints today.  REVIEW OF SYSTEMS:   Review of Systems  Constitutional: Positive for malaise/fatigue and weight loss. Negative for fever.  Respiratory: Negative.  Negative for cough, hemoptysis and shortness of breath.   Cardiovascular: Negative.  Negative for chest pain and leg swelling.  Gastrointestinal: Positive for abdominal pain. Negative for blood in stool and melena.  Genitourinary: Positive for urgency.  Musculoskeletal: Negative.  Negative for back pain.  Skin: Negative.  Negative for rash.  Neurological: Positive for weakness. Negative for dizziness, focal weakness and headaches.  Psychiatric/Behavioral: Negative.  The patient is not nervous/anxious.     As per HPI. Otherwise, a complete review of systems is negative.  PAST MEDICAL HISTORY: History reviewed. No pertinent past medical history.  PAST SURGICAL HISTORY: History reviewed. No pertinent surgical history.  FAMILY HISTORY: Family  History  Problem Relation Age of Onset  . Cancer Paternal Grandfather     ADVANCED DIRECTIVES (Y/N):  N  HEALTH MAINTENANCE: Social History   Tobacco Use  . Smoking status: Current Some Day Smoker    Packs/day: 1.00    Types: Cigarettes  . Smokeless tobacco: Never Used  Vaping Use  . Vaping Use: Never used  Substance Use Topics  . Alcohol use: Yes    Alcohol/week: 1.0 standard drink    Types: 1 Cans of beer per week  . Drug use: No     Colonoscopy:  PAP:  Bone density:  Lipid panel:  No Known Allergies  Current Outpatient Medications  Medication Sig Dispense Refill  . ALPRAZolam (XANAX) 0.5 MG tablet Take 1 tablet (0.5 mg total) by mouth 2 (two) times daily as needed for anxiety. 60 tablet 0  . capecitabine (XELODA) 150 MG tablet Take 1 tablet (150 mg total) by mouth 2 (two) times daily after a meal. Take Monday through Friday during radiation. Take only on days of radiation. 60 tablet 0  . capecitabine (XELODA) 500 MG tablet Take by mouth 2 (two) times daily after a meal. 1500 mg (3 tabs) twice a day.    . ibuprofen (ADVIL,MOTRIN) 100 MG tablet Take 800 mg by mouth every 6 (six) hours as needed.    . ondansetron (ZOFRAN) 8 MG tablet Take 1 tablet (8 mg total) by mouth every 8 (eight) hours as needed for nausea or vomiting. 45 tablet 0  . oxyCODONE (OXYCONTIN) 10 mg 12 hr tablet Take 1 tablet (10 mg total) by mouth every 8 (eight) hours. 90 tablet 0  . oxyCODONE-acetaminophen (PERCOCET/ROXICET) 5-325 MG tablet Take 1-2 tablets by mouth every 4 (four)  hours as needed for severe pain. 60 tablet 0  . polyethylene glycol (MIRALAX / GLYCOLAX) 17 g packet Take 17 g by mouth daily.    Marland Kitchen senna (SENOKOT) 8.6 MG TABS tablet Take 1 tablet (8.6 mg total) by mouth daily. 120 tablet 3  . tamsulosin (FLOMAX) 0.4 MG CAPS capsule Take 0.4 mg by mouth daily.    . cyclobenzaprine (FLEXERIL) 10 MG tablet Take 1 tablet (10 mg total) by mouth 3 (three) times daily as needed for muscle spasms. 30  tablet 0  . ondansetron (ZOFRAN) 8 MG tablet Take 1 tablet (8 mg total) by mouth 2 (two) times daily as needed (Nausea or vomiting). (Patient not taking: Reported on 07/25/2020) 60 tablet 1  . prochlorperazine (COMPAZINE) 10 MG tablet Take 1 tablet (10 mg total) by mouth every 6 (six) hours as needed (Nausea or vomiting). (Patient not taking: Reported on 07/25/2020) 60 tablet 1  . traZODone (DESYREL) 150 MG tablet Take 1 tablet (150 mg total) by mouth at bedtime. (Patient not taking: No sig reported) 30 tablet 1   No current facility-administered medications for this visit.    OBJECTIVE: Vitals:   07/25/20 0914  BP: 127/80  Pulse: 93  Resp: 18  Temp: 97.7 F (36.5 C)     Body mass index is 25.31 kg/m.    ECOG FS:0 - Asymptomatic  General: Well-developed, well-nourished, no acute distress. Eyes: Pink conjunctiva, anicteric sclera. HEENT: Normocephalic, moist mucous membranes. Lungs: No audible wheezing or coughing. Heart: Regular rate and rhythm. Abdomen: Soft, nontender, no obvious distention. Musculoskeletal: No edema, cyanosis, or clubbing. Neuro: Alert, answering all questions appropriately. Cranial nerves grossly intact. Skin: No rashes or petechiae noted. Psych: Normal affect.  LAB RESULTS:  Lab Results  Component Value Date   NA 137 07/25/2020   K 3.9 07/25/2020   CL 101 07/25/2020   CO2 27 07/25/2020   GLUCOSE 88 07/25/2020   BUN 9 07/25/2020   CREATININE 0.83 07/25/2020   CALCIUM 8.9 07/25/2020   PROT 6.9 07/25/2020   ALBUMIN 3.7 07/25/2020   AST 14 (L) 07/25/2020   ALT 12 07/25/2020   ALKPHOS 63 07/25/2020   BILITOT 0.4 07/25/2020   GFRNONAA >60 07/25/2020    Lab Results  Component Value Date   WBC 6.5 07/25/2020   NEUTROABS 4.6 07/25/2020   HGB 13.2 07/25/2020   HCT 37.6 (L) 07/25/2020   MCV 95.7 07/25/2020   PLT 212 07/25/2020     STUDIES: NM PET Image Initial (PI) Skull Base To Thigh  Result Date: 06/28/2020 CLINICAL DATA:  Initial treatment  strategy for rectal cancer. EXAM: NUCLEAR MEDICINE PET SKULL BASE TO THIGH TECHNIQUE: 9.965 mCi F-18 FDG was injected intravenously. Full-ring PET imaging was performed from the skull base to thigh after the radiotracer. CT data was obtained and used for attenuation correction and anatomic localization. Fasting blood glucose: 94 mg/dl COMPARISON:  CT scan 05/11/2020 FINDINGS: Mediastinal blood pool activity: SUV max 1.94 Liver activity: SUV max NA NECK: No hypermetabolic lymph nodes in the neck. Incidental CT findings: none CHEST: No hypermetabolic mediastinal or hilar nodes. No suspicious pulmonary nodules on the CT scan. Minimal FDG activity noted in the distal esophagus likely due to mild inflammations/reflux esophagitis. No esophageal lesion is identified. Incidental CT findings: Age advanced coronary artery calcifications are noted mainly in the LAD. ABDOMEN/PELVIS: Fairly large rectal mass is markedly hypermetabolic with SUV max of 123XX123. No findings suspicious for involvement of the mesorectum. No mesorectal adenopathy. No enlarged or hypermetabolic lymph  nodes in the sigmoid mesocolon or retroperitoneum. No findings suspicious for hepatic metastatic disease. The lower pole left renal mass is not hypermetabolic but this is not atypical and still quite suspicious for a renal cell neoplasm. Incidental CT findings: Scattered vascular calcifications. SKELETON: No focal hypermetabolic activity to suggest skeletal metastasis. Incidental CT findings: none IMPRESSION: 1. Markedly hypermetabolic large rectal mass consistent with known rectal carcinoma. No findings suspicious for locoregional adenopathy or hepatic metastatic disease. 2. The lower pole left renal mass is not hypermetabolic but still highly suspicious for renal neoplasm. Recommend urology consultation. Electronically Signed   By: Marijo Sanes M.D.   On: 06/28/2020 15:44    ASSESSMENT: Stage IIIb rectal cancer.  PLAN:    1.  Stage IIIb rectal  cancer: Pathology and imaging results reviewed independently.  EUS completed at outside facility confirmed stage of disease.  PET scan results from June 28, 2020 reviewed independently with no obvious malignancy outside of patient's known rectal mass.  He will benefit from neoadjuvant chemotherapy using capecitabine along with daily XRT.  Patient's dose of capecitabine is 1650 mg daily 5 days a week along with XRT.  Continue with daily treatment.  Patient will complete XRT on August 17, 2020.  Return to clinic in 1 week with repeat laboratory work and further evaluation.  Patient has both genetics and dietary appointment scheduled in the near future.  Appreciate clinical pharmacy input. 2.  Weight loss: Patient continues to lose weight.  Referral to dietary as above. 3.  Pain: Continue OxyContin every 8 hours and Percocet as needed. Patient does not wish to change his narcotics at this time. 4.  Left kidney mass: 2.8 x 3.6 x 3.4 mass highly concerning for a second malignancy.  Patient will likely have to undergo partial nephrectomy at the time of his rectal surgery. 5.  Urinary retention: We previously discussed the possibility of a referral back to urology for catheterization, but patient has declined.   6.  Constipation: Improved.  Continue stool softener and motility agent as needed.   Patient expressed understanding and was in agreement with this plan. He also understands that He can call clinic at any time with any questions, concerns, or complaints.   Cancer Staging Rectal cancer [facility]) Staging form: Colon and Rectum, AJCC 8th Edition - Clinical stage from 06/22/2020: Stage IIIB (cT3, cN1a, cM0) - Signed by Lloyd Huger, MD on 06/22/2020   Lloyd Huger, MD   07/25/2020 9:41 AM

## 2020-07-24 ENCOUNTER — Ambulatory Visit
Admission: RE | Admit: 2020-07-24 | Discharge: 2020-07-24 | Disposition: A | Discharge status: Home / Self Care | Payer: Self-pay | Source: Ambulatory Visit | Attending: Radiation Oncology | Admitting: Radiation Oncology

## 2020-07-25 ENCOUNTER — Encounter: Payer: Self-pay | Admitting: Oncology

## 2020-07-25 ENCOUNTER — Inpatient Hospital Stay (HOSPITAL_BASED_OUTPATIENT_CLINIC_OR_DEPARTMENT_OTHER): Payer: Self-pay | Admitting: Oncology

## 2020-07-25 ENCOUNTER — Ambulatory Visit
Admission: RE | Admit: 2020-07-25 | Discharge: 2020-07-25 | Disposition: A | Discharge status: Home / Self Care | Payer: Self-pay | Source: Ambulatory Visit | Attending: Radiation Oncology | Admitting: Radiation Oncology

## 2020-07-25 ENCOUNTER — Inpatient Hospital Stay: Payer: Self-pay

## 2020-07-25 ENCOUNTER — Ambulatory Visit: Payer: Self-pay | Admitting: Pharmacist

## 2020-07-25 VITALS — BP 127/80 | HR 93 | Temp 97.7°F | Resp 18 | Wt 187.6 lb

## 2020-07-25 DIAGNOSIS — C20 Malignant neoplasm of rectum: Secondary | ICD-10-CM

## 2020-07-25 LAB — COMPREHENSIVE METABOLIC PANEL
ALT: 12 U/L (ref 0–44)
AST: 14 U/L — ABNORMAL LOW (ref 15–41)
Albumin: 3.7 g/dL (ref 3.5–5.0)
Alkaline Phosphatase: 63 U/L (ref 38–126)
Anion gap: 9 (ref 5–15)
BUN: 9 mg/dL (ref 6–20)
CO2: 27 mmol/L (ref 22–32)
Calcium: 8.9 mg/dL (ref 8.9–10.3)
Chloride: 101 mmol/L (ref 98–111)
Creatinine, Ser: 0.83 mg/dL (ref 0.61–1.24)
GFR, Estimated: >60 mL/min (ref >60)
Glucose, Bld: 88 mg/dL (ref 70–99)
Potassium: 3.9 mmol/L (ref 3.5–5.1)
Sodium: 137 mmol/L (ref 135–145)
Total Bilirubin: 0.4 mg/dL (ref 0.3–1.2)
Total Protein: 6.9 g/dL (ref 6.5–8.1)

## 2020-07-25 LAB — CBC WITH DIFFERENTIAL/PLATELET
Abs Immature Granulocytes: 0.01 10*3/uL (ref 0.00–0.07)
Basophils Absolute: 0 10*3/uL (ref 0.0–0.1)
Basophils Relative: 1 %
Eosinophils Absolute: 0.5 10*3/uL (ref 0.0–0.5)
Eosinophils Relative: 8 %
HCT: 37.6 % — ABNORMAL LOW (ref 39.0–52.0)
Hemoglobin: 13.2 g/dL (ref 13.0–17.0)
Immature Granulocytes: 0 %
Lymphocytes Relative: 14 %
Lymphs Abs: 0.9 10*3/uL (ref 0.7–4.0)
MCH: 33.6 pg (ref 26.0–34.0)
MCHC: 35.1 g/dL (ref 30.0–36.0)
MCV: 95.7 fL (ref 80.0–100.0)
Monocytes Absolute: 0.4 10*3/uL (ref 0.1–1.0)
Monocytes Relative: 7 %
Neutro Abs: 4.6 10*3/uL (ref 1.7–7.7)
Neutrophils Relative %: 70 %
Platelets: 212 10*3/uL (ref 150–400)
RBC: 3.93 MIL/uL — ABNORMAL LOW (ref 4.22–5.81)
RDW: 12.6 % (ref 11.5–15.5)
WBC: 6.5 10*3/uL (ref 4.0–10.5)
nRBC: 0 % (ref 0.0–0.2)

## 2020-07-25 LAB — PHOSPHORUS: Phosphorus: 3.5 mg/dL (ref 2.5–4.6)

## 2020-07-25 LAB — MAGNESIUM: Magnesium: 2 mg/dL (ref 1.7–2.4)

## 2020-07-25 NOTE — Progress Notes (Signed)
Pt in for follow up, reports having some "rawness to rectum and pain".

## 2020-07-26 ENCOUNTER — Ambulatory Visit
Admission: RE | Admit: 2020-07-26 | Discharge: 2020-07-26 | Disposition: A | Discharge status: Home / Self Care | Payer: Self-pay | Source: Ambulatory Visit | Attending: Radiation Oncology | Admitting: Radiation Oncology

## 2020-07-26 LAB — CEA: CEA: 8.2 ng/mL — ABNORMAL HIGH (ref 0.0–4.7)

## 2020-07-27 ENCOUNTER — Ambulatory Visit
Admission: RE | Admit: 2020-07-27 | Discharge: 2020-07-27 | Disposition: A | Discharge status: Home / Self Care | Payer: Self-pay | Source: Ambulatory Visit | Attending: Radiation Oncology | Admitting: Radiation Oncology

## 2020-07-27 NOTE — Progress Notes (Signed)
Malik Lynch  Telephone:(336) [phone] Fax:(336) [phone]  ID: YOGESH Lynch OB: July 31, 1970  MR#: [id number]  MWN#:[id number]  Patient Care Team: Leonard Downing, MD as PCP - General (Family Medicine) [name], RN as Oncology Nurse Navigator  CHIEF COMPLAINT: Stage IIIb rectal cancer.  INTERVAL HISTORY: Patient returns to clinic today for further evaluation and continuation of capecitabine and XRT.  He has noticed increased weakness and fatigue this past week.  His pain is well controlled with his current narcotic regimen.  His appetite is worse and he has lost weight in the interim.  He continues to have insomnia.  His constipation has improved.  He denies any melena or hematochezia.  He has no neurologic complaints.  He denies any recent fevers or illnesses. He has no chest pain, shortness of breath, cough, or hemoptysis.  He denies any nausea, vomiting, or diarrhea.  Patient offers no further specific complaints today.  REVIEW OF SYSTEMS:   Review of Systems  Constitutional: Positive for malaise/fatigue and weight loss. Negative for fever.  Respiratory: Negative.  Negative for cough, hemoptysis and shortness of breath.   Cardiovascular: Negative.  Negative for chest pain and leg swelling.  Gastrointestinal: Positive for abdominal pain. Negative for blood in stool and melena.  Genitourinary: Positive for urgency.  Musculoskeletal: Negative.  Negative for back pain.  Skin: Negative.  Negative for rash.  Neurological: Positive for weakness. Negative for dizziness, focal weakness and headaches.  Psychiatric/Behavioral: Negative.  The patient is not nervous/anxious.     As per HPI. Otherwise, a complete review of systems is negative.  PAST MEDICAL HISTORY: History reviewed. No pertinent past medical history.  PAST SURGICAL HISTORY: History reviewed. No pertinent surgical history.  FAMILY HISTORY: Family History  Problem Relation Age of Onset  .  Cancer Paternal Grandfather     ADVANCED DIRECTIVES (Y/N):  N  HEALTH MAINTENANCE: Social History   Tobacco Use  . Smoking status: Current Some Day Smoker    Packs/day: 1.00    Types: Cigarettes  . Smokeless tobacco: Never Used  Vaping Use  . Vaping Use: Never used  Substance Use Topics  . Alcohol use: Yes    Alcohol/week: 1.0 standard drink    Types: 1 Cans of beer per week  . Drug use: No     Colonoscopy:  PAP:  Bone density:  Lipid panel:  No Known Allergies  Current Outpatient Medications  Medication Sig Dispense Refill  . capecitabine (XELODA) 150 MG tablet Take 1 tablet (150 mg total) by mouth 2 (two) times daily after a meal. Take Monday through Friday during radiation. Take only on days of radiation. 60 tablet 0  . capecitabine (XELODA) 500 MG tablet Take by mouth 2 (two) times daily after a meal. 1500 mg (3 tabs) twice a day.    . ibuprofen (ADVIL,MOTRIN) 100 MG tablet Take 800 mg by mouth every 6 (six) hours as needed.    . ondansetron (ZOFRAN) 8 MG tablet Take 1 tablet (8 mg total) by mouth every 8 (eight) hours as needed for nausea or vomiting. 45 tablet 0  . oxyCODONE (OXYCONTIN) 10 mg 12 hr tablet Take 1 tablet (10 mg total) by mouth every 8 (eight) hours. 90 tablet 0  . oxyCODONE-acetaminophen (PERCOCET/ROXICET) 5-325 MG tablet Take 1-2 tablets by mouth every 4 (four) hours as needed for severe pain. 60 tablet 0  . polyethylene glycol (MIRALAX / GLYCOLAX) 17 g packet Take 17 g by mouth daily.    . tamsulosin (  FLOMAX) 0.4 MG CAPS capsule Take 0.4 mg by mouth daily.    Marland Kitchen ALPRAZolam (XANAX) 0.5 MG tablet Take 1 tablet (0.5 mg total) by mouth 2 (two) times daily as needed for anxiety. 60 tablet 0  . cyclobenzaprine (FLEXERIL) 10 MG tablet Take 1 tablet (10 mg total) by mouth 3 (three) times daily as needed for muscle spasms. 30 tablet 0  . senna (SENOKOT) 8.6 MG TABS tablet Take 1 tablet (8.6 mg total) by mouth daily. (Patient not taking: Reported on 08/01/2020) 120  tablet 3  . Sennosides-Docusate Sodium 8.6-50 MG CAPS Take 1 tablet by mouth as needed for constipation.    . traZODone (DESYREL) 150 MG tablet Take 1 tablet (150 mg total) by mouth at bedtime. (Patient not taking: No sig reported) 30 tablet 1   No current facility-administered medications for this visit.    OBJECTIVE: Vitals:   08/01/20 0918  BP: (!) 120/93  Pulse: 100  Resp: 18  Temp: 98.7 F (37.1 C)     Body mass index is 24.19 kg/m.    ECOG FS:0 - Asymptomatic  General: Well-developed, well-nourished, no acute distress. Eyes: Pink conjunctiva, anicteric sclera. HEENT: Normocephalic, moist mucous membranes. Lungs: No audible wheezing or coughing. Heart: Regular rate and rhythm. Abdomen: Soft, nontender, no obvious distention. Musculoskeletal: No edema, cyanosis, or clubbing. Neuro: Alert, answering all questions appropriately. Cranial nerves grossly intact. Skin: No rashes or petechiae noted. Psych: Normal affect.  LAB RESULTS:  Lab Results  Component Value Date   NA 134 (L) 08/01/2020   K 4.0 08/01/2020   CL 98 08/01/2020   CO2 27 08/01/2020   GLUCOSE 90 08/01/2020   BUN 13 08/01/2020   CREATININE 1.03 08/01/2020   CALCIUM 9.4 08/01/2020   PROT 7.4 08/01/2020   ALBUMIN 4.1 08/01/2020   AST 16 08/01/2020   ALT 13 08/01/2020   ALKPHOS 72 08/01/2020   BILITOT 0.4 08/01/2020   GFRNONAA >60 08/01/2020    Lab Results  Component Value Date   WBC 7.6 08/01/2020   NEUTROABS 5.9 08/01/2020   HGB 14.5 08/01/2020   HCT 41.2 08/01/2020   MCV 95.6 08/01/2020   PLT 226 08/01/2020     STUDIES: No results found.  ASSESSMENT: Stage IIIb rectal cancer.  PLAN:    1.  Stage IIIb rectal cancer: Pathology and imaging results reviewed independently.  EUS completed at outside facility confirmed stage of disease.  PET scan results from June 28, 2020 reviewed independently with no obvious malignancy outside of patient's known rectal mass.  He will benefit from  neoadjuvant chemotherapy using capecitabine along with daily XRT.  Patient's dose of capecitabine is 1650 mg daily 5 days a week along with XRT.  He does not wish to dose reduce at this time.  Continue with daily treatment.  Patient will complete XRT on August 17, 2020.  Return to clinic in 1 week with repeat laboratory work and further evaluation.  He has a genetics appointment later this week.  Appreciate clinical pharmacy input. 2.  Weight loss: Patient continues to have a poor appetite and lose weight.  He has an appointment with dietary later this week. 3.  Pain: Continue OxyContin every 8 hours and Percocet as needed. Patient does not wish to change his narcotics at this time. 4.  Left kidney mass: 2.8 x 3.6 x 3.4 mass highly concerning for a second malignancy.  Patient will likely have to undergo partial nephrectomy at the time of his rectal surgery. 5.  Urinary retention:  We previously discussed the possibility of a referral back to urology for catheterization, but patient has declined.   6.  Constipation: Improved.  Continue stool softener and motility agent as needed.  I spent a total of 30 minutes reviewing chart data, face-to-face evaluation with the patient, counseling and coordination of care as detailed above.    Patient expressed understanding and was in agreement with this plan. He also understands that He can call clinic at any time with any questions, concerns, or complaints.   Cancer Staging Rectal cancer [facility]) Staging form: Colon and Rectum, AJCC 8th Edition - Clinical stage from 06/22/2020: Stage IIIB (cT3, cN1a, cM0) - Signed by Lloyd Huger, MD on 06/22/2020 Stage prefix: Initial diagnosis Total positive nodes: 1   Lloyd Huger, MD   08/02/2020 5:52 AM

## 2020-07-28 ENCOUNTER — Ambulatory Visit
Admission: RE | Admit: 2020-07-28 | Discharge: 2020-07-28 | Disposition: A | Discharge status: Home / Self Care | Payer: Self-pay | Source: Ambulatory Visit | Attending: Radiation Oncology | Admitting: Radiation Oncology

## 2020-07-31 ENCOUNTER — Telehealth: Payer: Self-pay | Admitting: Licensed Clinical Social Worker

## 2020-07-31 ENCOUNTER — Other Ambulatory Visit: Payer: Self-pay | Admitting: *Deleted

## 2020-07-31 ENCOUNTER — Ambulatory Visit
Admission: RE | Admit: 2020-07-31 | Discharge: 2020-07-31 | Disposition: A | Discharge status: Home / Self Care | Payer: Self-pay | Source: Ambulatory Visit | Attending: Radiation Oncology | Admitting: Radiation Oncology

## 2020-07-31 MED ORDER — ALPRAZOLAM 0.5 MG PO TABS: 0.5000 mg | ORAL_TABLET | Freq: Two times a day (BID) | ORAL | 0 refills | Status: DC | PRN

## 2020-07-31 MED ORDER — CYCLOBENZAPRINE HCL 10 MG PO TABS: 10.0000 mg | ORAL_TABLET | Freq: Three times a day (TID) | ORAL | 0 refills | Status: DC | PRN

## 2020-07-31 NOTE — Telephone Encounter (Signed)
Patient's wife called stating that he was having a hard time urinating, and only a small amount comes out when he is able to go. The patient has an appointment with his urologist on 2/3, he was advised by the urologist to increase his flomax to an additional 0.4mg . Patient agreed with this plan.

## 2020-08-01 ENCOUNTER — Inpatient Hospital Stay: Payer: Self-pay | Admitting: Pharmacist

## 2020-08-01 ENCOUNTER — Other Ambulatory Visit: Payer: Self-pay

## 2020-08-01 ENCOUNTER — Other Ambulatory Visit: Payer: Self-pay | Admitting: Hospice and Palliative Medicine

## 2020-08-01 ENCOUNTER — Ambulatory Visit
Admission: RE | Admit: 2020-08-01 | Discharge: 2020-08-01 | Disposition: A | Discharge status: Home / Self Care | Payer: Self-pay | Source: Ambulatory Visit | Attending: Radiation Oncology | Admitting: Radiation Oncology

## 2020-08-01 ENCOUNTER — Inpatient Hospital Stay (HOSPITAL_BASED_OUTPATIENT_CLINIC_OR_DEPARTMENT_OTHER): Payer: Self-pay | Admitting: Oncology

## 2020-08-01 ENCOUNTER — Encounter: Payer: Self-pay | Admitting: Oncology

## 2020-08-01 ENCOUNTER — Inpatient Hospital Stay: Payer: Self-pay | Attending: Oncology

## 2020-08-01 VITALS — BP 120/93 | HR 100 | Temp 98.7°F | Resp 18 | Wt 179.3 lb

## 2020-08-01 DIAGNOSIS — Z800 Family history of malignant neoplasm of digestive organs: Secondary | ICD-10-CM | POA: Insufficient documentation

## 2020-08-01 DIAGNOSIS — C20 Malignant neoplasm of rectum: Secondary | ICD-10-CM

## 2020-08-01 DIAGNOSIS — R630 Anorexia: Secondary | ICD-10-CM | POA: Insufficient documentation

## 2020-08-01 DIAGNOSIS — Z8042 Family history of malignant neoplasm of prostate: Secondary | ICD-10-CM | POA: Insufficient documentation

## 2020-08-01 DIAGNOSIS — Z807 Family history of other malignant neoplasms of lymphoid, hematopoietic and related tissues: Secondary | ICD-10-CM | POA: Insufficient documentation

## 2020-08-01 DIAGNOSIS — Z8051 Family history of malignant neoplasm of kidney: Secondary | ICD-10-CM | POA: Insufficient documentation

## 2020-08-01 DIAGNOSIS — Z809 Family history of malignant neoplasm, unspecified: Secondary | ICD-10-CM | POA: Insufficient documentation

## 2020-08-01 DIAGNOSIS — Z803 Family history of malignant neoplasm of breast: Secondary | ICD-10-CM | POA: Insufficient documentation

## 2020-08-01 DIAGNOSIS — K5900 Constipation, unspecified: Secondary | ICD-10-CM | POA: Insufficient documentation

## 2020-08-01 DIAGNOSIS — F17210 Nicotine dependence, cigarettes, uncomplicated: Secondary | ICD-10-CM | POA: Insufficient documentation

## 2020-08-01 DIAGNOSIS — Z79899 Other long term (current) drug therapy: Secondary | ICD-10-CM | POA: Insufficient documentation

## 2020-08-01 DIAGNOSIS — R531 Weakness: Secondary | ICD-10-CM | POA: Insufficient documentation

## 2020-08-01 DIAGNOSIS — R5383 Other fatigue: Secondary | ICD-10-CM | POA: Insufficient documentation

## 2020-08-01 DIAGNOSIS — Z510 Encounter for antineoplastic radiation therapy: Secondary | ICD-10-CM | POA: Insufficient documentation

## 2020-08-01 DIAGNOSIS — Z801 Family history of malignant neoplasm of trachea, bronchus and lung: Secondary | ICD-10-CM | POA: Insufficient documentation

## 2020-08-01 DIAGNOSIS — R634 Abnormal weight loss: Secondary | ICD-10-CM | POA: Insufficient documentation

## 2020-08-01 DIAGNOSIS — Z7289 Other problems related to lifestyle: Secondary | ICD-10-CM | POA: Insufficient documentation

## 2020-08-01 DIAGNOSIS — G4700 Insomnia, unspecified: Secondary | ICD-10-CM | POA: Insufficient documentation

## 2020-08-01 DIAGNOSIS — R109 Unspecified abdominal pain: Secondary | ICD-10-CM | POA: Insufficient documentation

## 2020-08-01 DIAGNOSIS — N2889 Other specified disorders of kidney and ureter: Secondary | ICD-10-CM | POA: Insufficient documentation

## 2020-08-01 DIAGNOSIS — R339 Retention of urine, unspecified: Secondary | ICD-10-CM | POA: Insufficient documentation

## 2020-08-01 LAB — CBC WITH DIFFERENTIAL/PLATELET
Abs Immature Granulocytes: 0.02 10*3/uL (ref 0.00–0.07)
Basophils Absolute: 0 10*3/uL (ref 0.0–0.1)
Basophils Relative: 1 %
Eosinophils Absolute: 0.5 10*3/uL (ref 0.0–0.5)
Eosinophils Relative: 6 %
HCT: 41.2 % (ref 39.0–52.0)
Hemoglobin: 14.5 g/dL (ref 13.0–17.0)
Immature Granulocytes: 0 %
Lymphocytes Relative: 9 %
Lymphs Abs: 0.7 10*3/uL (ref 0.7–4.0)
MCH: 33.6 pg (ref 26.0–34.0)
MCHC: 35.2 g/dL (ref 30.0–36.0)
MCV: 95.6 fL (ref 80.0–100.0)
Monocytes Absolute: 0.5 10*3/uL (ref 0.1–1.0)
Monocytes Relative: 7 %
Neutro Abs: 5.9 10*3/uL (ref 1.7–7.7)
Neutrophils Relative %: 77 %
Platelets: 226 10*3/uL (ref 150–400)
RBC: 4.31 MIL/uL (ref 4.22–5.81)
RDW: 13.2 % (ref 11.5–15.5)
WBC: 7.6 10*3/uL (ref 4.0–10.5)
nRBC: 0 % (ref 0.0–0.2)

## 2020-08-01 LAB — COMPREHENSIVE METABOLIC PANEL
ALT: 13 U/L (ref 0–44)
AST: 16 U/L (ref 15–41)
Albumin: 4.1 g/dL (ref 3.5–5.0)
Alkaline Phosphatase: 72 U/L (ref 38–126)
Anion gap: 9 (ref 5–15)
BUN: 13 mg/dL (ref 6–20)
CO2: 27 mmol/L (ref 22–32)
Calcium: 9.4 mg/dL (ref 8.9–10.3)
Chloride: 98 mmol/L (ref 98–111)
Creatinine, Ser: 1.03 mg/dL (ref 0.61–1.24)
GFR, Estimated: >60 mL/min (ref >60)
Glucose, Bld: 90 mg/dL (ref 70–99)
Potassium: 4 mmol/L (ref 3.5–5.1)
Sodium: 134 mmol/L — ABNORMAL LOW (ref 135–145)
Total Bilirubin: 0.4 mg/dL (ref 0.3–1.2)
Total Protein: 7.4 g/dL (ref 6.5–8.1)

## 2020-08-01 LAB — MAGNESIUM: Magnesium: 2 mg/dL (ref 1.7–2.4)

## 2020-08-01 LAB — PHOSPHORUS: Phosphorus: 4.2 mg/dL (ref 2.5–4.6)

## 2020-08-01 MED ORDER — CYCLOBENZAPRINE HCL 10 MG PO TABS: 10.0000 mg | ORAL_TABLET | Freq: Three times a day (TID) | ORAL | 0 refills | Status: DC | PRN

## 2020-08-01 MED ORDER — ALPRAZOLAM 0.5 MG PO TABS
0.5000 mg | ORAL_TABLET | Freq: Two times a day (BID) | ORAL | 0 refills | Status: DC | PRN
Start: 2020-08-01 — End: 2020-08-11

## 2020-08-01 NOTE — Progress Notes (Signed)
Pt and caregiver in for follow up, reports more raw to rectal area.  Pt with 8lbs weight loss in week. Reports decreased appetite and nausea.

## 2020-08-01 NOTE — Progress Notes (Signed)
[facility]  Telephone:([phone] [phone]  Patient Care Team: Jerime Arif Downing, MD as PCP - General (Family Medicine) [name], RN as Oncology Nurse Navigator   Name of the patient: Malik Lynch  [id number]  14-Jun-1971   Date of visit: 08/01/20  HPI: Patient is a 50 y.o. male with stage IIIb rectal cancer. Planned treatment of Xeloda (capecitabine) and XRT.  Reason for Consult: Oral chemotherapy follow-up for capecitabine therapy.   PAST MEDICAL HISTORY:No past medical history on file.  PAST SURGICAL HISTORY: No past surgical history on file.  HEMATOLOGY/ONCOLOGY HISTORY:  Oncology History  Rectal cancer ([place])  06/22/2020 Initial Diagnosis   Rectal cancer ([place])   06/22/2020 Cancer Staging   Staging form: Colon and Rectum, AJCC 8th Edition - Clinical stage from 06/22/2020: Stage IIIB (cT3, cN1a, cM0) - Signed by Lloyd Huger, MD on 06/22/2020   06/22/2020 -  Chemotherapy   The patient had capecitabine (XELODA) 150 MG tablet, 150 mg (100 % of original dose 150 mg), Oral, 2 times daily after meals, 0 of 1 cycle, Start date: 06/29/2020, End date: -- Dose modification: 150 mg (original dose 150 mg, Cycle 1) capecitabine (XELODA) 500 MG tablet, 1,500 mg (100 % of original dose 1,500 mg), Oral, 2 times daily after meals, 0 of 1 cycle, Start date: 06/29/2020, End date: -- Dose modification: 1,500 mg (original dose 1,500 mg, Cycle 1)  for chemotherapy treatment.      ALLERGIES:  has No Known Allergies.  MEDICATIONS:  Current Outpatient Medications  Medication Sig Dispense Refill  . ALPRAZolam (XANAX) 0.5 MG tablet Take 1 tablet (0.5 mg total) by mouth 2 (two) times daily as needed for anxiety. 60 tablet 0  . capecitabine (XELODA) 150 MG tablet Take 1 tablet (150 mg total) by mouth 2 (two) times daily after a meal. Take Monday through Friday during radiation. Take only on days of radiation. 60  tablet 0  . capecitabine (XELODA) 500 MG tablet Take by mouth 2 (two) times daily after a meal. 1500 mg (3 tabs) twice a day.    . cyclobenzaprine (FLEXERIL) 10 MG tablet Take 1 tablet (10 mg total) by mouth 3 (three) times daily as needed for muscle spasms. 30 tablet 0  . ibuprofen (ADVIL,MOTRIN) 100 MG tablet Take 800 mg by mouth every 6 (six) hours as needed.    . ondansetron (ZOFRAN) 8 MG tablet Take 1 tablet (8 mg total) by mouth every 8 (eight) hours as needed for nausea or vomiting. 45 tablet 0  . oxyCODONE (OXYCONTIN) 10 mg 12 hr tablet Take 1 tablet (10 mg total) by mouth every 8 (eight) hours. 90 tablet 0  . oxyCODONE-acetaminophen (PERCOCET/ROXICET) 5-325 MG tablet Take 1-2 tablets by mouth every 4 (four) hours as needed for severe pain. 60 tablet 0  . polyethylene glycol (MIRALAX / GLYCOLAX) 17 g packet Take 17 g by mouth daily.    . prochlorperazine (COMPAZINE) 10 MG tablet Take 1 tablet (10 mg total) by mouth every 6 (six) hours as needed (Nausea or vomiting). (Patient not taking: No sig reported) 60 tablet 1  . senna (SENOKOT) 8.6 MG TABS tablet Take 1 tablet (8.6 mg total) by mouth daily. (Patient not taking: Reported on 08/01/2020) 120 tablet 3  . tamsulosin (FLOMAX) 0.4 MG CAPS capsule Take 0.4 mg by mouth daily.    . traZODone (DESYREL) 150 MG tablet Take 1 tablet (150 mg total) by mouth at bedtime. (Patient not taking: No  sig reported) 30 tablet 1   No current facility-administered medications for this visit.    VITAL SIGNS: There were no vitals taken for this visit. There were no vitals filed for this visit.  Estimated body mass index is 24.19 kg/m as calculated from the following:   Height as of 06/29/20: 6' 0.18" (1.833 m).   Weight as of an earlier encounter on 08/01/20: 81.3 kg (179 lb 4.8 oz).  LABS: CBC:    Component Value Date/Time   WBC 7.6 08/01/2020 0828   HGB 14.5 08/01/2020 0828   HCT 41.2 08/01/2020 0828   PLT 226 08/01/2020 0828   MCV 95.6 08/01/2020  0828   NEUTROABS 5.9 08/01/2020 0828   LYMPHSABS 0.7 08/01/2020 0828   MONOABS 0.5 08/01/2020 0828   EOSABS 0.5 08/01/2020 0828   BASOSABS 0.0 08/01/2020 0828   Comprehensive Metabolic Panel:    Component Value Date/Time   NA 134 (L) 08/01/2020 0828   K 4.0 08/01/2020 0828   CL 98 08/01/2020 0828   CO2 27 08/01/2020 0828   BUN 13 08/01/2020 0828   CREATININE 1.03 08/01/2020 0828   GLUCOSE 90 08/01/2020 0828   CALCIUM 9.4 08/01/2020 0828   AST 16 08/01/2020 0828   ALT 13 08/01/2020 0828   ALKPHOS 72 08/01/2020 0828   BILITOT 0.4 08/01/2020 0828   PROT 7.4 08/01/2020 0828   ALBUMIN 4.1 08/01/2020 0828    RADIOGRAPHIC STUDIES: No results found.   Assessment and Plan-  Continue Xeloda with radiation.   Oral Chemotherapy Side Effect/Intolerance:  -Fatigue: patient reporting increased fatigue since the start of radiation. He is still able to manage his daily activities but is taking more nap and resting often when he returns home following his radiation treatment. Discussed the option of decreasing his dose of fatigue continues to worsen, patient and his wife seem to prefer powering though for now. -Constipation and Diarrhea: patients reports issues with both constipation and diarrhea. Wife reported they picked up a new OTC product to help with constipation but she could not remember the name. She will call back with that information when they get home. Last week he mostly struggled with diarrhea. It is worse on treatment days and better on the weekends. For management they have just been stopping this constipation medication and waiting for it to get better. Reviewed the use of loperamide in the situation in addition to stopping his constipation medications. -N/V: Patient has had some vomiting events lately. He is using the at home antinausea medication prn. He did not like prochlorperazine because it made him dizzy, but ondansetron is working out well for him. Discussed taking his  ondansetron 30-60 mins prior to first Xeloda dose of the day. -Other, skin: no signs of hand-foot syndrome (pain, peeling, tingling), but wife did mention that his hands seemed redder to her, continue to watch  Oral Chemotherapy Adherence: no missed doses reported, they are continuing to use the pill tray.  Medication Access Issues: none  No patient barriers to medication adherence identified.   Patient expressed understanding and was in agreement with this plan. He also understands that He can call clinic at any time with any questions, concerns, or complaints.   Thank you for allowing me to participate in the care of this very pleasant patient.   Time Total: 15 mins  Visit consisted of counseling and education on dealing with issues of symptom management in the setting of serious and potentially life-threatening illness.Greater than 50%  of this time was  spent counseling and coordinating care related to the above assessment and plan.  Signed by: Darl Pikes, PharmD, BCPS, BCOP, CPP Hematology/Oncology Clinical Pharmacist Practitioner [facility] [phone]

## 2020-08-01 NOTE — Progress Notes (Signed)
Previous Rx sent to wrong pharmacy. Resent per patient request.

## 2020-08-01 NOTE — Addendum Note (Signed)
Addended by: Darl Pikes on: 08/01/2020 03:13 PM   Modules accepted: Orders

## 2020-08-02 ENCOUNTER — Ambulatory Visit
Admission: RE | Admit: 2020-08-02 | Discharge: 2020-08-02 | Disposition: A | Discharge status: Home / Self Care | Payer: Self-pay | Source: Ambulatory Visit | Attending: Radiation Oncology | Admitting: Radiation Oncology

## 2020-08-02 LAB — CEA: CEA: 8.2 ng/mL — ABNORMAL HIGH (ref 0.0–4.7)

## 2020-08-03 ENCOUNTER — Inpatient Hospital Stay: Payer: Self-pay

## 2020-08-03 ENCOUNTER — Ambulatory Visit
Admission: RE | Admit: 2020-08-03 | Discharge: 2020-08-03 | Disposition: A | Discharge status: Home / Self Care | Payer: Self-pay | Source: Ambulatory Visit | Attending: Radiation Oncology | Admitting: Radiation Oncology

## 2020-08-03 ENCOUNTER — Inpatient Hospital Stay (HOSPITAL_BASED_OUTPATIENT_CLINIC_OR_DEPARTMENT_OTHER): Payer: Self-pay | Admitting: Licensed Clinical Social Worker

## 2020-08-03 ENCOUNTER — Encounter: Payer: Self-pay | Admitting: Licensed Clinical Social Worker

## 2020-08-03 DIAGNOSIS — Z8049 Family history of malignant neoplasm of other genital organs: Secondary | ICD-10-CM | POA: Insufficient documentation

## 2020-08-03 DIAGNOSIS — Z803 Family history of malignant neoplasm of breast: Secondary | ICD-10-CM

## 2020-08-03 DIAGNOSIS — Z800 Family history of malignant neoplasm of digestive organs: Secondary | ICD-10-CM

## 2020-08-03 DIAGNOSIS — Z8042 Family history of malignant neoplasm of prostate: Secondary | ICD-10-CM

## 2020-08-03 DIAGNOSIS — Z8051 Family history of malignant neoplasm of kidney: Secondary | ICD-10-CM

## 2020-08-03 DIAGNOSIS — C20 Malignant neoplasm of rectum: Secondary | ICD-10-CM

## 2020-08-03 NOTE — Progress Notes (Signed)
Nutrition Assessment   Reason for Assessment:  Weight loss, poor appetite   ASSESSMENT:  50 year old male with rectal cancer.  Patient receiving xeloda and radiation.    Met with patient and wife following radiation. Patient reports during first week of treatment appetite was pretty good but towards the end of 2nd week of treatment and currently decreased appetite and fatigue.  Reports issues with nausea but taking zofran about 30-60 minutes before eating has helped.  Typically has cereal for breakfast and fruite or eggs and toast or egg sandwich.  Lunch is usually sandwich or soup with fruit. Has snack around 3-4 and then dinner.  Dinner usually includes meat with other side items (taco salad last night).  Likes popsciles. Reports bowels are moving without laxative since being on xeloda. Bowel movements are typically soft to loose.  Does not like oral nutrition supplements   Medications: zofran,   Labs: reviewed   Anthropometrics:   Height: 72 inches Weight: 179 lb  2/1 UBW: 200-210 before diagnosis. Wife and son noticed weight loss before diagnosis  192 lb 06/20/20  BMI: 24  7% weight loss in the last month and week, significant  Estimated Energy Needs  Kcals: 2400-2800 Protein: 120-140 g Fluid: > 2.4 L   NUTRITION DIAGNOSIS: Inadequate oral intake related to cancer and cancer related treatment side effects as evidenced by 7% weight loss in the last month and 1 week and poor po intake   INTERVENTION:  Discussed ways to add calories and protein to diet. Discussed foods rich in protein Encouraged small frequent meals Encouraged taking nausea medications Contact number given   MONITORING, EVALUATION, GOAL: weight trends, intake   Next Visit: Feb 24 phone call  Damyan Corne B. Zenia Resides, [place], [place] Registered Dietitian [phone] (mobile)

## 2020-08-03 NOTE — Progress Notes (Signed)
REFERRING PROVIDER: Lloyd Huger, MD [address] [place],  [place] [address]  PRIMARY PROVIDER:  Leonard Downing, MD  PRIMARY REASON FOR VISIT:  1. Rectal cancer ([place])   2. Family history of colon cancer   3. Family history of uterine cancer   4. Family history of breast cancer   5. Family history of prostate cancer      HISTORY OF PRESENT ILLNESS:   Malik Lynch, a 50 y.o. male, was seen for a [facility] cancer genetics consultation at the request of Dr. Grayland Ormond due to a personal and family history of cancer.  Malik Lynch presents to clinic today to discuss the possibility of a hereditary predisposition to cancer, genetic testing, and to further clarify his future cancer risks, as well as potential cancer risks for family members.   In 2021, at the age of 37, Malik Lynch was diagnosed with rectal cancer. He is currently being treated with neoadjuvant chemotherapy and radiation to be followed by rectal surgery.   CANCER HISTORY:  Oncology History  Rectal cancer ([facility])  06/22/2020 Initial Diagnosis   Rectal cancer ([facility])   06/22/2020 Cancer Staging   Staging form: Colon and Rectum, AJCC 8th Edition - Clinical stage from 06/22/2020: Stage IIIB (cT3, cN1a, cM0) - Signed by Lloyd Huger, MD on 06/22/2020   06/22/2020 -  Chemotherapy   The patient had capecitabine (XELODA) 150 MG tablet, 150 mg (100 % of original dose 150 mg), Oral, 2 times daily after meals, 0 of 1 cycle, Start date: 06/29/2020, End date: -- Dose modification: 150 mg (original dose 150 mg, Cycle 1) capecitabine (XELODA) 500 MG tablet, 1,500 mg (100 % of original dose 1,500 mg), Oral, 2 times daily after meals, 0 of 1 cycle, Start date: 06/29/2020, End date: -- Dose modification: 1,500 mg (original dose 1,500 mg, Cycle 1)  for chemotherapy treatment.       Past Medical History:  Diagnosis Date  . Family history of breast cancer   . Family history of colon cancer   . Family history of  prostate cancer   . Family history of uterine cancer     No past surgical history on file.  Social History   Socioeconomic History  . Marital status: Single    Spouse name: Not on file  . Number of children: Not on file  . Years of education: Not on file  . Highest education level: Not on file  Occupational History  . Not on file  Tobacco Use  . Smoking status: Current Some Day Smoker    Packs/day: 1.00    Types: Cigarettes  . Smokeless tobacco: Never Used  Vaping Use  . Vaping Use: Never used  Substance and Sexual Activity  . Alcohol use: Yes    Alcohol/week: 1.0 standard drink    Types: 1 Cans of beer per week  . Drug use: No  . Sexual activity: Not Currently  Other Topics Concern  . Not on file  Social History Narrative  . Not on file   Social Determinants of Health   Financial Resource Strain: Not on file  Food Insecurity: Not on file  Transportation Needs: Not on file  Physical Activity: Not on file  Stress: Not on file  Social Connections: Not on file     FAMILY HISTORY:  We obtained a detailed, 4-generation family history.  Significant diagnoses are listed below: Family History  Problem Relation Age of Onset  . Throat cancer Mother 75  . Lymphoma  Maternal Aunt   . Kidney cancer Maternal Uncle   . Breast cancer Paternal Aunt   . Colon cancer Paternal Uncle   . Lung cancer Maternal Grandmother   . Cancer Maternal Grandmother        mouth  . Prostate cancer Maternal Grandfather        dx 71s  . Colon cancer Maternal Grandfather        dx 104s  . Lung cancer Maternal Uncle   . Cancer Maternal Uncle        possibly kidney  . Uterine cancer Paternal Aunt   . Cancer Paternal Aunt        reproductive  . Kidney cancer Paternal Uncle   . Breast cancer Cousin   . Breast cancer Cousin   . Colon cancer Cousin        dx 18s   Malik Lynch has 2 sons, ages 22 and 70, neither have had colonoscopy or cancer.  Malik Lynch mother is living and had  throat cancer recently at 27. Patient has 3 maternal aunts, 4 maternal uncles. An aunt had lymphoma. An uncle had kidney, another uncle possibly had kidney, and another uncle had lung cancer. There is at least one maternal cousin who has had cancer, unknown types. Maternal grandmother had lung cancer at a young age, mouth cancer. Maternal grandfather had prostate cancer in his 17s and colon cancer in his 32s.   Malik Lynch's father died at 53. Patient had 2 paternal uncles and 6 paternal aunts. One uncle had colon cnacer, another uncle had kidney cancer. An aunt had breast cancer, another aunt had cancer, unknown type but believed to be something reproductive. Another aunt had uterine cancer in her 22s-60s. She had 3 daughters, 2 had breast cancer and 1 is currently going through colon cancer in her 71s. Paternal grandparents did not have history of cancer. Patient does note history of work exposure to chemicals on both sides of the family and for himself.   Malik Lynch is unaware of previous family history of genetic testing for hereditary cancer risks. Patient's maternal ancestors are of unknown/European descent, and paternal ancestors are of unknown/European descent. There is no reported Ashkenazi Jewish ancestry. There is no known consanguinity.   GENETIC COUNSELING ASSESSMENT: Malik Lynch is a 50 y.o. male with a personal and family history of cancer which is somewhat suggestive of a hereditary cancer syndrome and predisposition to cancer. We, therefore, discussed and recommended the following at today's visit.   DISCUSSION: We discussed that approximately 10% of colorectal cancer is hereditary  Most cases of hereditary colorectal cancer are associated with Lynch syndrome genes, although there are other genes associated with hereditary colorectal cancer as well as other cancers seen in his family such as kidney, breast, prostate. We discussed that testing is beneficial for several reasons including   knowing about other cancer risks, identifying potential screening and risk-reduction options that may be appropriate, and to understand if other family members could be at risk for cancer and allow them to undergo genetic testing.   We reviewed the characteristics, features and inheritance patterns of hereditary cancer syndromes. We also discussed genetic testing, including the appropriate family members to test, the process of testing, insurance coverage and turn-around-time for results. We discussed the implications of a negative, positive and/or variant of uncertain significant result. We recommended Malik Lynch pursue genetic testing for the [facility] CancerNext-Expanded gene panel.   The CancerNext-Expanded + RNAinsight gene panel offered by [employer] [facility] and  includes sequencing and rearrangement analysis for the following 77 genes: IP, ALK, APC*, ATM*, AXIN2, BAP1, BARD1, BLM, BMPR1A, BRCA1*, BRCA2*, BRIP1*, CDC73, CDH1*,CDK4, CDKN1B, CDKN2A, CHEK2*, CTNNA1, DICER1, FANCC, FH, FLCN, GALNT12, KIF1B, LZTR1, MAX, MEN1, MET, MLH1*, MSH2*, MSH3, MSH6*, MUTYH*, NBN, NF1*, NF2, NTHL1, PALB2*, PHOX2B, PMS2*, POT1, PRKAR1A, PTCH1, PTEN*, RAD51C*, RAD51D*,RB1, RECQL, RET, SDHA, SDHAF2, SDHB, SDHC, SDHD, SMAD4, SMARCA4, SMARCB1, SMARCE1, STK11, SUFU, TMEM127, TP53*,TSC1, TSC2, VHL and XRCC2 (sequencing and deletion/duplication); EGFR, EGLN1, HOXB13, KIT, MITF, PDGFRA, POLD1 and POLE (sequencing only); EPCAM and GREM1 (deletion/duplication only).   Based on Malik Lynch's personal and family history of cancer, he meets medical criteria for genetic testing. Despite that he meets criteria, he may still have an out of pocket cost. We discussed that if his out of pocket cost for testing is over $100, the laboratory will call and confirm whether he wants to proceed with testing.  If the out of pocket cost of testing is less than $100 he will be billed by the genetic testing laboratory.   PLAN: After considering the  risks, benefits, and limitations, Malik Lynch provided informed consent to pursue genetic testing and the blood sample was sent to [facility] for analysis of the CancerNext-Expanded+RNA. Results should be available within approximately 2-3 weeks' time, at which point they will be disclosed by telephone to Malik Lynch, as will any additional recommendations warranted by these results. Malik Lynch will receive a summary of his genetic counseling visit and a copy of his results once available. This information will also be available in Epic.   Malik Lynch questions were answered to his satisfaction today. Our contact information was provided should additional questions or concerns arise. Thank you for the referral and allowing [place] to share in the care of your patient.   Faith Rogue, MS, [facility] Genetic Counselor [email or web address] Phone: [phone]  The patient was seen for a total of 40 minutes in face-to-face genetic counseling. Patient's wife Malik Lynch was also present. Dr. Grayland Ormond was available for discussion regarding this case.   _______________________________________________________________________ For Office Staff:  Number of people involved in session: 2 Was an Intern/ student involved with case: no

## 2020-08-04 ENCOUNTER — Ambulatory Visit
Admission: RE | Admit: 2020-08-04 | Discharge: 2020-08-04 | Disposition: A | Discharge status: Home / Self Care | Payer: Self-pay | Source: Ambulatory Visit | Attending: Radiation Oncology | Admitting: Radiation Oncology

## 2020-08-07 ENCOUNTER — Ambulatory Visit
Admission: RE | Admit: 2020-08-07 | Discharge: 2020-08-07 | Disposition: A | Discharge status: Home / Self Care | Payer: Self-pay | Source: Ambulatory Visit | Attending: Radiation Oncology | Admitting: Radiation Oncology

## 2020-08-07 NOTE — Progress Notes (Signed)
[place]  [phone] [phone]  ID: Malik Lynch OB: [birth year]  MR#: [id number]  BOF#:[id number]  Patient Care Team: Leonard Downing, MD as PCP - General (Family Medicine) [name], RN as Oncology Nurse Navigator  CHIEF COMPLAINT: Stage IIIb rectal cancer.  INTERVAL HISTORY: Patient returns to clinic today for further evaluation and continuation of capecitabine and XRT.  He had significant diarrhea over this weekend with an increase in his weakness and fatigue.  He continues to have a poor appetite and intermittent pain.  He continues to have insomnia as well.  He denies any melena or hematochezia.  He has no neurologic complaints.  He denies any recent fevers or illnesses. He has no chest pain, shortness of breath, cough, or hemoptysis.  He denies any nausea or vomiting.  Patient offers no further specific complaints today.  REVIEW OF SYSTEMS:   Review of Systems  Constitutional: Positive for malaise/fatigue and weight loss. Negative for fever.  Respiratory: Negative.  Negative for cough, hemoptysis and shortness of breath.   Cardiovascular: Negative.  Negative for chest pain and leg swelling.  Gastrointestinal: Positive for abdominal pain and diarrhea. Negative for blood in stool and melena.  Genitourinary: Positive for urgency.  Musculoskeletal: Negative.  Negative for back pain.  Skin: Negative.  Negative for rash.  Neurological: Positive for weakness. Negative for dizziness, focal weakness and headaches.  Psychiatric/Behavioral: Negative.  The patient is not nervous/anxious.     As per HPI. Otherwise, a complete review of systems is negative.  PAST MEDICAL HISTORY: Past Medical History:  Diagnosis Date  . Family history of breast cancer   . Family history of colon cancer   . Family history of kidney cancer   . Family history of prostate cancer   . Family history of uterine cancer     PAST SURGICAL  HISTORY: No past surgical history on file.  FAMILY HISTORY: Family History  Problem Relation Age of Onset  . Throat cancer Mother 65  . Lymphoma Maternal Aunt   . Kidney cancer Maternal Uncle   . Breast cancer Paternal Aunt   . Colon cancer Paternal Uncle   . Lung cancer Maternal Grandmother   . Cancer Maternal Grandmother        mouth  . Prostate cancer Maternal Grandfather        dx 59s  . Colon cancer Maternal Grandfather        dx 13s  . Lung cancer Maternal Uncle   . Cancer Maternal Uncle        possibly kidney  . Uterine cancer Paternal Aunt   . Cancer Paternal Aunt        reproductive  . Kidney cancer Paternal Uncle   . Breast cancer Cousin   . Breast cancer Cousin   . Colon cancer Cousin        dx 19s    ADVANCED DIRECTIVES (Y/N):  N  HEALTH MAINTENANCE: Social History   Tobacco Use  . Smoking status: Current Some Day Smoker    Packs/day: 1.00    Types: Cigarettes  . Smokeless tobacco: Never Used  Vaping Use  . Vaping Use: Never used  Substance Use Topics  . Alcohol use: Yes    Alcohol/week: 1.0 standard drink    Types: 1 Cans of beer per week  . Drug use: No     Colonoscopy:  PAP:  Bone density:  Lipid panel:  No Known Allergies  Current Outpatient Medications  Medication Sig  Dispense Refill  . ALPRAZolam (XANAX) 0.5 MG tablet Take 1 tablet (0.5 mg total) by mouth 2 (two) times daily as needed for anxiety. 60 tablet 0  . capecitabine (XELODA) 150 MG tablet Take 1 tablet (150 mg total) by mouth 2 (two) times daily after a meal. Take Monday through Friday during radiation. Take only on days of radiation. 60 tablet 0  . capecitabine (XELODA) 500 MG tablet Take by mouth 2 (two) times daily after a meal. 1500 mg (3 tabs) twice a day.    . cyclobenzaprine (FLEXERIL) 10 MG tablet Take 1 tablet (10 mg total) by mouth 3 (three) times daily as needed for muscle spasms. 30 tablet 0  . ibuprofen (ADVIL,MOTRIN) 100 MG tablet Take 800 mg by mouth every 6  (six) hours as needed.    . ondansetron (ZOFRAN) 8 MG tablet Take 1 tablet (8 mg total) by mouth every 8 (eight) hours as needed for nausea or vomiting. 45 tablet 0  . oxyCODONE (OXYCONTIN) 10 mg 12 hr tablet Take 1 tablet (10 mg total) by mouth every 8 (eight) hours. 90 tablet 0  . oxyCODONE-acetaminophen (PERCOCET/ROXICET) 5-325 MG tablet Take 1-2 tablets by mouth every 4 (four) hours as needed for severe pain. 60 tablet 0  . polyethylene glycol (MIRALAX / GLYCOLAX) 17 g packet Take 17 g by mouth daily.    Marland Kitchen senna (SENOKOT) 8.6 MG TABS tablet Take 1 tablet (8.6 mg total) by mouth daily. (Patient not taking: Reported on 08/01/2020) 120 tablet 3  . Sennosides-Docusate Sodium 8.6-50 MG CAPS Take 1 tablet by mouth as needed for constipation.    . tamsulosin (FLOMAX) 0.4 MG CAPS capsule Take 0.4 mg by mouth daily.    . traZODone (DESYREL) 150 MG tablet Take 1 tablet (150 mg total) by mouth at bedtime. (Patient not taking: No sig reported) 30 tablet 1   No current facility-administered medications for this visit.    OBJECTIVE: Vitals:   08/08/20 0940  BP: (!) 157/67  Pulse: (!) 110  Temp: (!) 96.7 F (35.9 C)     Body mass index is 23.82 kg/m.    ECOG FS:1 - Symptomatic but completely ambulatory  General: Well-developed, well-nourished, no acute distress. Eyes: Pink conjunctiva, anicteric sclera. HEENT: Normocephalic, moist mucous membranes. Lungs: No audible wheezing or coughing. Heart: Regular rate and rhythm. Abdomen: Soft, nontender, no obvious distention. Musculoskeletal: No edema, cyanosis, or clubbing. Neuro: Alert, answering all questions appropriately. Cranial nerves grossly intact. Skin: No rashes or petechiae noted. Psych: Normal affect.   LAB RESULTS:  Lab Results  Component Value Date   NA 138 08/08/2020   K 3.6 08/08/2020   CL 100 08/08/2020   CO2 27 08/08/2020   GLUCOSE 81 08/08/2020   BUN 11 08/08/2020   CREATININE 1.16 08/08/2020   CALCIUM 9.7 08/08/2020    PROT 7.8 08/08/2020   ALBUMIN 4.2 08/08/2020   AST 28 08/08/2020   ALT 37 08/08/2020   ALKPHOS 80 08/08/2020   BILITOT 0.5 08/08/2020   GFRNONAA >60 08/08/2020    Lab Results  Component Value Date   WBC 6.1 08/08/2020   NEUTROABS 4.8 08/08/2020   HGB 14.5 08/08/2020   HCT 40.3 08/08/2020   MCV 95.3 08/08/2020   PLT 244 08/08/2020     STUDIES: No results found.  ASSESSMENT: Stage IIIb rectal cancer.  PLAN:    1.  Stage IIIb rectal cancer: Pathology and imaging results reviewed independently.  EUS completed at outside facility confirmed stage of disease.  PET  scan results from June 28, 2020 reviewed independently with no obvious malignancy outside of patient's known rectal mass.  He will benefit from neoadjuvant chemotherapy using capecitabine along with daily XRT.  Patient's dose of capecitabine is 1650 mg daily 5 days a week along with XRT.  Given his significant diarrhea and declining performance status, will dose reduce capecitabine to 1500 mg daily.  Continue daily XRT completing treatment on August 17, 2020.  Return to clinic in 1 week for repeat laboratory work and further evaluation.  Appreciate genetics and clinical pharmacy input. 2.  Weight loss: Patient continues to have a poor appetite and lose weight.  Appreciate dietary input. 3.  Pain: Chronic and unchanged.  Continue OxyContin every 8 hours and Percocet as needed. Patient does not wish to change his narcotics at this time. 4.  Left kidney mass: 2.8 x 3.6 x 3.4 mass highly concerning for a second malignancy.  Patient will likely have to undergo partial nephrectomy at the time of his rectal surgery. 5.  Urinary retention: [place] input.  Continue Flomax as prescribed. 6.  Diarrhea: Likely secondary to capecitabine.  Monitor. 7.  Dehydration: Patient declined IV fluids today, but will reconsider later this week.    Patient expressed understanding and was in agreement with this plan. He also  understands that He can call clinic at any time with any questions, concerns, or complaints.   Cancer Staging Rectal cancer [facility]) Staging form: Colon and Rectum, AJCC 8th Edition - Clinical stage from 06/22/2020: Stage IIIB (cT3, cN1a, cM0) - Signed by Lloyd Huger, MD on 06/22/2020 Stage prefix: Initial diagnosis Total positive nodes: 1   Lloyd Huger, MD   08/08/2020 11:27 AM

## 2020-08-08 ENCOUNTER — Inpatient Hospital Stay (HOSPITAL_BASED_OUTPATIENT_CLINIC_OR_DEPARTMENT_OTHER): Payer: Self-pay | Admitting: Oncology

## 2020-08-08 ENCOUNTER — Ambulatory Visit
Admission: RE | Admit: 2020-08-08 | Discharge: 2020-08-08 | Disposition: A | Discharge status: Home / Self Care | Payer: Self-pay | Source: Ambulatory Visit | Attending: Radiation Oncology | Admitting: Radiation Oncology

## 2020-08-08 ENCOUNTER — Inpatient Hospital Stay: Payer: Self-pay

## 2020-08-08 ENCOUNTER — Inpatient Hospital Stay: Payer: Self-pay | Admitting: Pharmacist

## 2020-08-08 ENCOUNTER — Encounter: Payer: Self-pay | Admitting: Oncology

## 2020-08-08 VITALS — BP 157/67 | HR 110 | Temp 96.7°F | Wt 176.5 lb

## 2020-08-08 DIAGNOSIS — C20 Malignant neoplasm of rectum: Secondary | ICD-10-CM

## 2020-08-08 LAB — CBC WITH DIFFERENTIAL/PLATELET
Abs Immature Granulocytes: 0.02 10*3/uL (ref 0.00–0.07)
Basophils Absolute: 0 10*3/uL (ref 0.0–0.1)
Basophils Relative: 1 %
Eosinophils Absolute: 0.3 10*3/uL (ref 0.0–0.5)
Eosinophils Relative: 5 %
HCT: 40.3 % (ref 39.0–52.0)
Hemoglobin: 14.5 g/dL (ref 13.0–17.0)
Immature Granulocytes: 0 %
Lymphocytes Relative: 8 %
Lymphs Abs: 0.5 10*3/uL — ABNORMAL LOW (ref 0.7–4.0)
MCH: 34.3 pg — ABNORMAL HIGH (ref 26.0–34.0)
MCHC: 36 g/dL (ref 30.0–36.0)
MCV: 95.3 fL (ref 80.0–100.0)
Monocytes Absolute: 0.5 10*3/uL (ref 0.1–1.0)
Monocytes Relative: 8 %
Neutro Abs: 4.8 10*3/uL (ref 1.7–7.7)
Neutrophils Relative %: 78 %
Platelets: 244 10*3/uL (ref 150–400)
RBC: 4.23 MIL/uL (ref 4.22–5.81)
RDW: 13.9 % (ref 11.5–15.5)
WBC: 6.1 10*3/uL (ref 4.0–10.5)
nRBC: 0 % (ref 0.0–0.2)

## 2020-08-08 LAB — PHOSPHORUS: Phosphorus: 3.4 mg/dL (ref 2.5–4.6)

## 2020-08-08 LAB — COMPREHENSIVE METABOLIC PANEL
ALT: 37 U/L (ref 0–44)
AST: 28 U/L (ref 15–41)
Albumin: 4.2 g/dL (ref 3.5–5.0)
Alkaline Phosphatase: 80 U/L (ref 38–126)
Anion gap: 11 (ref 5–15)
BUN: 11 mg/dL (ref 6–20)
CO2: 27 mmol/L (ref 22–32)
Calcium: 9.7 mg/dL (ref 8.9–10.3)
Chloride: 100 mmol/L (ref 98–111)
Creatinine, Ser: 1.16 mg/dL (ref 0.61–1.24)
GFR, Estimated: >60 mL/min (ref >60)
Glucose, Bld: 81 mg/dL (ref 70–99)
Potassium: 3.6 mmol/L (ref 3.5–5.1)
Sodium: 138 mmol/L (ref 135–145)
Total Bilirubin: 0.5 mg/dL (ref 0.3–1.2)
Total Protein: 7.8 g/dL (ref 6.5–8.1)

## 2020-08-08 LAB — MAGNESIUM: Magnesium: 2 mg/dL (ref 1.7–2.4)

## 2020-08-08 NOTE — Progress Notes (Signed)
[name]  [phone] [phone]  Patient Care Team: [name], MD as PCP - General (Family Medicine) [name], RN as Oncology Nurse Navigator   Name of the patient: Malik Lynch  [id number]  [birth year]   Date of visit: 08/08/20  HPI: Patient is a 50 y.o. male with stage IIIb rectal cancer. Planned treatment of Xeloda (capecitabine) and XRT.  Reason for Consult: Oral chemotherapy follow-up for capecitabine therapy.   PAST MEDICAL HISTORY: Past Medical History:  Diagnosis Date  . Family history of breast cancer   . Family history of colon cancer   . Family history of kidney cancer   . Family history of prostate cancer   . Family history of uterine cancer     PAST SURGICAL HISTORY: No past surgical history on file.  HEMATOLOGY/ONCOLOGY HISTORY:  Oncology History  Rectal cancer ([place])  06/22/2020 Initial Diagnosis   Rectal cancer ([place])   06/22/2020 Cancer Staging   Staging form: Colon and Rectum, AJCC 8th Edition - Clinical stage from 06/22/2020: Stage IIIB (cT3, cN1a, cM0) - Signed by Lloyd Huger, MD on 06/22/2020   06/22/2020 -  Chemotherapy   The patient had capecitabine (XELODA) 150 MG tablet, 150 mg (100 % of original dose 150 mg), Oral, 2 times daily after meals, 0 of 1 cycle, Start date: 06/29/2020, End date: -- Dose modification: 150 mg (original dose 150 mg, Cycle 1) capecitabine (XELODA) 500 MG tablet, 1,500 mg (100 % of original dose 1,500 mg), Oral, 2 times daily after meals, 0 of 1 cycle, Start date: 06/29/2020, End date: -- Dose modification: 1,500 mg (original dose 1,500 mg, Cycle 1)  for chemotherapy treatment.      ALLERGIES:  has No Known Allergies.  MEDICATIONS:  Current Outpatient Medications  Medication Sig Dispense Refill  . ALPRAZolam (XANAX) 0.5 MG tablet Take 1 tablet (0.5 mg total) by mouth 2 (two) times daily as needed for anxiety. 60  tablet 0  . capecitabine (XELODA) 150 MG tablet Take 1 tablet (150 mg total) by mouth 2 (two) times daily after a meal. Take Monday through Friday during radiation. Take only on days of radiation. 60 tablet 0  . capecitabine (XELODA) 500 MG tablet Take by mouth 2 (two) times daily after a meal. 1500 mg (3 tabs) twice a day.    . cyclobenzaprine (FLEXERIL) 10 MG tablet Take 1 tablet (10 mg total) by mouth 3 (three) times daily as needed for muscle spasms. 30 tablet 0  . ibuprofen (ADVIL,MOTRIN) 100 MG tablet Take 800 mg by mouth every 6 (six) hours as needed.    . ondansetron (ZOFRAN) 8 MG tablet Take 1 tablet (8 mg total) by mouth every 8 (eight) hours as needed for nausea or vomiting. 45 tablet 0  . oxyCODONE (OXYCONTIN) 10 mg 12 hr tablet Take 1 tablet (10 mg total) by mouth every 8 (eight) hours. 90 tablet 0  . oxyCODONE-acetaminophen (PERCOCET/ROXICET) 5-325 MG tablet Take 1-2 tablets by mouth every 4 (four) hours as needed for severe pain. 60 tablet 0  . polyethylene glycol (MIRALAX / GLYCOLAX) 17 g packet Take 17 g by mouth daily.    Marland Kitchen senna (SENOKOT) 8.6 MG TABS tablet Take 1 tablet (8.6 mg total) by mouth daily. (Patient not taking: Reported on 08/01/2020) 120 tablet 3  . Sennosides-Docusate Sodium 8.6-50 MG CAPS Take 1 tablet by mouth as needed for constipation.    . tamsulosin (FLOMAX) 0.4 MG CAPS capsule Take  0.4 mg by mouth daily.    . traZODone (DESYREL) 150 MG tablet Take 1 tablet (150 mg total) by mouth at bedtime. (Patient not taking: No sig reported) 30 tablet 1   No current facility-administered medications for this visit.    VITAL SIGNS: There were no vitals taken for this visit. There were no vitals filed for this visit.  Estimated body mass index is 23.82 kg/m as calculated from the following:   Height as of 06/29/20: 6' 0.18" (1.833 m).   Weight as of an earlier encounter on 08/08/20: 80.1 kg (176 lb 8 oz).  LABS: CBC:    Component Value Date/Time   WBC 6.1 08/08/2020  0829   HGB 14.5 08/08/2020 0829   HCT 40.3 08/08/2020 0829   PLT 244 08/08/2020 0829   MCV 95.3 08/08/2020 0829   NEUTROABS 4.8 08/08/2020 0829   LYMPHSABS 0.5 (L) 08/08/2020 0829   MONOABS 0.5 08/08/2020 0829   EOSABS 0.3 08/08/2020 0829   BASOSABS 0.0 08/08/2020 0829   Comprehensive Metabolic Panel:    Component Value Date/Time   NA 138 08/08/2020 0829   K 3.6 08/08/2020 0829   CL 100 08/08/2020 0829   CO2 27 08/08/2020 0829   BUN 11 08/08/2020 0829   CREATININE 1.16 08/08/2020 0829   GLUCOSE 81 08/08/2020 0829   CALCIUM 9.7 08/08/2020 0829   AST 28 08/08/2020 0829   ALT 37 08/08/2020 0829   ALKPHOS 80 08/08/2020 0829   BILITOT 0.5 08/08/2020 0829   PROT 7.8 08/08/2020 0829   ALBUMIN 4.2 08/08/2020 0829    RADIOGRAPHIC STUDIES: No results found.   Assessment and Plan-  Continue Xeloda with radiation. Per discussion with Dr. Grayland Ormond, will decrease dose of Xeloda to 1500 mg twice daily (3x 500 mg tablets). Follow up in clinic next week.    Oral Chemotherapy Side Effect/Intolerance:  -Fatigue: patient continues to experience fatigue. He states it is manageable as he is still able to complete his daily activities but is still taking more naps and resting often. He states he is looking forward to completing treatment next week so he can have more energy to walk and play with his dog.   -Constipation and Diarrhea: patient reports issues with both constipation and diarrhea. Diarrhea appears to present over the weekend and constipation takes over towards the end of the week. His diarrhea this weekend was "pretty rough" and he reports feeling fatigued, dizzy, and dehydrated. His wife purchased loperamide and he reports using it for the first time ~2:30 am Sunday. He found it worked for a little but needed to take another dose around 5 am. At this point he didn't require another dose until he returned from RT. Encouraged patient to take loperamide as needed, one after each stool, and  that he can take up to 8 capsules/day. Patient was cautioned to assess stools to ensure he wasn't taking too much loperamide to further cause constipation issues. He was encouraged to drink more water today to combat any dehydration and that he could arrange for IV fluids here if needed. Expect diarrhea to improve with decreased Xeloda dose.   -N/V: He reports no recent vomiting events. He states that he is out of the "6 hour" anti-emetic but that the "6 hour one" works better than the "8 hour one". Will reach out to patient and wife once they return home to clarify which medication works best and will send a refill. He previously he reported he did not like prochlorperazine because it  made him dizzy.   -Other, skin: Last week the patient's wife mentioned his hands seemed more red, they appear normal today and similar to last week. He does report some dry skin on the hands, lower legs, and feet. He also reports a somewhat painful, dry, "fluid filled area but not a blister" on the heel of the foot. Upon examination, this area appears normal with combination dry and moisturized skin that he reports is less painful now compared to last week. The patient and his wife were encouraged to start moisturizing the dry areas on the skin of hands, feet, and lower legs. They have moisturizing cream on hand at home. Again, expect for this to improve with decreased Xeloda dose if related.   Oral Chemotherapy Adherence: no missed doses reported, they are continuing to use the pill tray.  Medication Access Issues: none  No patient barriers to medication adherence identified.   Patient expressed understanding and was in agreement with this plan. He also understands that He can call clinic at any time with any questions, concerns, or complaints.   Thank you for allowing me to participate in the care of this very pleasant patient.   Time Total: 25 mins  Visit consisted of counseling and education on dealing with  issues of symptom management in the setting of serious and potentially life-threatening illness.Greater than 50%  of this time was spent counseling and coordinating care related to the above assessment and plan.  Visit completed by:  Eddie Candle, PharmD, BCPS PGY2 Hem/Onc Resident    Signed by: Darl Pikes, PharmD, BCPS, BCOP, CPP Hematology/Oncology Clinical Pharmacist Practitioner [facility] [phone]

## 2020-08-09 ENCOUNTER — Other Ambulatory Visit: Payer: Self-pay | Admitting: Hospice and Palliative Medicine

## 2020-08-09 ENCOUNTER — Ambulatory Visit
Admission: RE | Admit: 2020-08-09 | Discharge: 2020-08-09 | Disposition: A | Discharge status: Home / Self Care | Payer: Self-pay | Source: Ambulatory Visit | Attending: Radiation Oncology | Admitting: Radiation Oncology

## 2020-08-09 LAB — CEA: CEA: 7 ng/mL — ABNORMAL HIGH (ref 0.0–4.7)

## 2020-08-09 MED ORDER — OXYCODONE-ACETAMINOPHEN 5-325 MG PO TABS: 1.0000 | ORAL_TABLET | ORAL | 0 refills | Status: DC | PRN

## 2020-08-09 MED ORDER — CYCLOBENZAPRINE HCL 10 MG PO TABS: 10.0000 mg | ORAL_TABLET | Freq: Three times a day (TID) | ORAL | 0 refills | Status: DC | PRN

## 2020-08-09 MED ORDER — PROCHLORPERAZINE MALEATE 10 MG PO TABS: 10.0000 mg | ORAL_TABLET | Freq: Four times a day (QID) | ORAL | 1 refills | Status: DC | PRN

## 2020-08-10 ENCOUNTER — Ambulatory Visit
Admission: RE | Admit: 2020-08-10 | Discharge: 2020-08-10 | Disposition: A | Discharge status: Home / Self Care | Payer: Self-pay | Source: Ambulatory Visit | Attending: Radiation Oncology | Admitting: Radiation Oncology

## 2020-08-10 ENCOUNTER — Other Ambulatory Visit: Payer: Self-pay | Admitting: *Deleted

## 2020-08-10 ENCOUNTER — Inpatient Hospital Stay: Payer: Self-pay

## 2020-08-10 DIAGNOSIS — C20 Malignant neoplasm of rectum: Secondary | ICD-10-CM

## 2020-08-10 MED ORDER — SODIUM CHLORIDE 0.9 % IV SOLN
Freq: Once | INTRAVENOUS | Status: AC
  Filled 2020-08-10: qty 250

## 2020-08-10 MED ORDER — ONDANSETRON HCL 8 MG PO TABS: 8.0000 mg | ORAL_TABLET | Freq: Three times a day (TID) | ORAL | 0 refills | Status: AC | PRN

## 2020-08-10 NOTE — Progress Notes (Signed)
Patient tolerated IV fluids infusion well.  Patient discharged. Stable.

## 2020-08-11 ENCOUNTER — Other Ambulatory Visit: Payer: Self-pay | Admitting: Hospice and Palliative Medicine

## 2020-08-11 ENCOUNTER — Ambulatory Visit
Admission: RE | Admit: 2020-08-11 | Discharge: 2020-08-11 | Disposition: A | Discharge status: Home / Self Care | Payer: Self-pay | Source: Ambulatory Visit | Attending: Radiation Oncology | Admitting: Radiation Oncology

## 2020-08-11 MED ORDER — ALPRAZOLAM 0.5 MG PO TABS
0.5000 mg | ORAL_TABLET | Freq: Two times a day (BID) | ORAL | 0 refills | Status: DC | PRN
Start: 2020-08-11 — End: 2020-10-12

## 2020-08-11 NOTE — Telephone Encounter (Signed)
Did you see my note on this encounter about needing his Xanax but wanting it increased to 1 mg?

## 2020-08-11 NOTE — Progress Notes (Signed)
Refill alprazolam. Wife had requested through triage for consideration of increasing dose to 1mg  BID. I tried calling both patient and wife and did not reach them. Will schedule clinic follow up when patient returns next week to discuss in person.

## 2020-08-12 NOTE — Progress Notes (Signed)
[facility]  Telephone:(336) [phone] [phone]) [phone]  ID: Malik Lynch OB: 1971-04-03  MR#: [id number]  BSJ#:[id number]  Patient Care Team: Leonard Downing, MD as PCP - General (Family Medicine) [name], RN as Oncology Nurse Navigator  CHIEF COMPLAINT: Stage IIIb rectal cancer.  INTERVAL HISTORY: Patient returns to clinic today for further evaluation and continuation of capecitabine and daily XRT.  He continues to have increased weakness and fatigue and a poor appetite.  He continues to have persistent diarrhea.  He has intermittent abdominal pain.  He has difficulty urinating. He denies any melena or hematochezia.  He has no neurologic complaints.  He denies any recent fevers or illnesses. He has no chest pain, shortness of breath, cough, or hemoptysis.  He denies any nausea or vomiting.  Patient feels generally terrible, but offers no further specific complaints today.  REVIEW OF SYSTEMS:   Review of Systems  Constitutional: Positive for malaise/fatigue and weight loss. Negative for fever.  Respiratory: Negative.  Negative for cough, hemoptysis and shortness of breath.   Cardiovascular: Negative.  Negative for chest pain and leg swelling.  Gastrointestinal: Positive for abdominal pain and diarrhea. Negative for blood in stool and melena.  Genitourinary: Positive for urgency.  Musculoskeletal: Negative.  Negative for back pain.  Skin: Negative.  Negative for rash.  Neurological: Positive for weakness. Negative for dizziness, focal weakness and headaches.  Psychiatric/Behavioral: Negative.  The patient is not nervous/anxious.     As per HPI. Otherwise, a complete review of systems is negative.  PAST MEDICAL HISTORY: Past Medical History:  Diagnosis Date  . Family history of breast cancer   . Family history of colon cancer   . Family history of kidney cancer   . Family history of prostate cancer   . Family history of uterine cancer      PAST SURGICAL HISTORY: History reviewed. No pertinent surgical history.  FAMILY HISTORY: Family History  Problem Relation Age of Onset  . Throat cancer Mother 35  . Lymphoma Maternal Aunt   . Kidney cancer Maternal Uncle   . Breast cancer Paternal Aunt   . Colon cancer Paternal Uncle   . Lung cancer Maternal Grandmother   . Cancer Maternal Grandmother        mouth  . Prostate cancer Maternal Grandfather        dx 79s  . Colon cancer Maternal Grandfather        dx 61s  . Lung cancer Maternal Uncle   . Cancer Maternal Uncle        possibly kidney  . Uterine cancer Paternal Aunt   . Cancer Paternal Aunt        reproductive  . Kidney cancer Paternal Uncle   . Breast cancer Cousin   . Breast cancer Cousin   . Colon cancer Cousin        dx 75s    ADVANCED DIRECTIVES (Y/N):  N  HEALTH MAINTENANCE: Social History   Tobacco Use  . Smoking status: Current Some Day Smoker    Packs/day: 1.00    Types: Cigarettes  . Smokeless tobacco: Never Used  Vaping Use  . Vaping Use: Never used  Substance Use Topics  . Alcohol use: Yes    Alcohol/week: 1.0 standard drink    Types: 1 Cans of beer per week  . Drug use: No     Colonoscopy:  PAP:  Bone density:  Lipid panel:  No Known Allergies  Current Outpatient Medications  Medication Sig  Dispense Refill  . ALPRAZolam (XANAX) 0.5 MG tablet Take 1 tablet (0.5 mg total) by mouth 2 (two) times daily as needed for anxiety. 60 tablet 0  . capecitabine (XELODA) 500 MG tablet Take by mouth 2 (two) times daily after a meal. 1500 mg (3 tabs) twice a day.    . cyclobenzaprine (FLEXERIL) 10 MG tablet Take 1 tablet (10 mg total) by mouth 3 (three) times daily as needed for muscle spasms. 60 tablet 0  . ibuprofen (ADVIL,MOTRIN) 100 MG tablet Take 800 mg by mouth every 6 (six) hours as needed.    . loperamide (IMODIUM A-D) 2 MG tablet Take 2 mg by mouth 4 (four) times daily as needed for diarrhea or loose stools.    . ondansetron  (ZOFRAN) 8 MG tablet Take 1 tablet (8 mg total) by mouth every 8 (eight) hours as needed for nausea or vomiting. 45 tablet 0  . oxyCODONE (OXYCONTIN) 10 mg 12 hr tablet Take 1 tablet (10 mg total) by mouth every 8 (eight) hours. 90 tablet 0  . oxyCODONE-acetaminophen (PERCOCET/ROXICET) 5-325 MG tablet Take 1-2 tablets by mouth every 4 (four) hours as needed for severe pain. 90 tablet 0  . prochlorperazine (COMPAZINE) 10 MG tablet Take 1 tablet (10 mg total) by mouth every 6 (six) hours as needed (Nausea or vomiting). 60 tablet 1  . tamsulosin (FLOMAX) 0.4 MG CAPS capsule Take 0.4 mg by mouth daily.    . mirtazapine (REMERON) 7.5 MG tablet Take 1 tablet (7.5 mg total) by mouth at bedtime. 30 tablet 2  . polyethylene glycol (MIRALAX / GLYCOLAX) 17 g packet Take 17 g by mouth daily. (Patient not taking: Reported on 08/15/2020)    . senna (SENOKOT) 8.6 MG TABS tablet Take 1 tablet (8.6 mg total) by mouth daily. (Patient not taking: No sig reported) 120 tablet 3  . Sennosides-Docusate Sodium 8.6-50 MG CAPS Take 1 tablet by mouth as needed for constipation. (Patient not taking: Reported on 08/15/2020)     No current facility-administered medications for this visit.    OBJECTIVE: There were no vitals filed for this visit.   Body mass index is 23.03 kg/m.    ECOG FS:1 - Symptomatic but completely ambulatory  General: Well-developed, well-nourished, no acute distress. Eyes: Pink conjunctiva, anicteric sclera. HEENT: Normocephalic, moist mucous membranes. Lungs: No audible wheezing or coughing. Heart: Regular rate and rhythm. Abdomen: Soft, nontender, no obvious distention. Musculoskeletal: No edema, cyanosis, or clubbing. Neuro: Alert, answering all questions appropriately. Cranial nerves grossly intact. Skin: No rashes or petechiae noted. Psych: Normal affect.   LAB RESULTS:  Lab Results  Component Value Date   NA 136 08/15/2020   K 3.1 (L) 08/15/2020   CL 98 08/15/2020   CO2 28 08/15/2020    GLUCOSE 81 08/15/2020   BUN 8 08/15/2020   CREATININE 0.88 08/15/2020   CALCIUM 9.4 08/15/2020   PROT 7.7 08/15/2020   ALBUMIN 4.1 08/15/2020   AST 26 08/15/2020   ALT 47 (H) 08/15/2020   ALKPHOS 70 08/15/2020   BILITOT 0.5 08/15/2020   GFRNONAA >60 08/15/2020    Lab Results  Component Value Date   WBC 7.2 08/15/2020   NEUTROABS 5.8 08/15/2020   HGB 14.9 08/15/2020   HCT 41.4 08/15/2020   MCV 95.8 08/15/2020   PLT 269 08/15/2020     STUDIES: No results found.  ASSESSMENT: Stage IIIb rectal cancer.  PLAN:    1.  Stage IIIb rectal cancer: Pathology and imaging results reviewed independently.  EUS  completed at outside facility confirmed stage of disease.  PET scan results from June 28, 2020 reviewed independently with no obvious malignancy outside of patient's known rectal mass.  He will benefit from neoadjuvant chemotherapy using capecitabine along with daily XRT.  Patient's initial dose of capecitabine was 1650 mg daily 5 days a week along with XRT, but given his significant diarrhea and declining performance status, this was dose reduced to 1500 mg daily.  Continue treatment completing on August 17, 2020.  Patient has an appointment with surgery on August 23, 2020 for treatment planning.  Return to clinic in 2 weeks for further evaluation.  Patient will have consultation with dietary at that time as well. 2.  Weight loss: Patient continues to have a poor appetite and lose weight.  Appreciate dietary input.  Follow-up with them as above. 3.  Pain: Chronic and unchanged.  Continue OxyContin every 8 hours and Percocet as needed. Patient does not wish to change his narcotics at this time.  Patient was given a refill today. 4.  Left kidney mass: 2.8 x 3.6 x 3.4 mass highly concerning for a second malignancy.  Patient will likely have to undergo partial nephrectomy at the time of his rectal surgery.  Appointment with them as above. 5.  Urinary retention: [facility]  input.  Continue Flomax as prescribed. 6.  Diarrhea: Chronic and unchanged.  Likely secondary to capecitabine and XRT.  Continue Imodium and Lomotil as prescribed. 7.  Dehydration: Patient continues to decline IV fluids. 8.  Hypokalemia: Patient was given dietary changes.  Monitor. 9.  Anxiety: Appreciate palliative care input.   Patient expressed understanding and was in agreement with this plan. He also understands that He can call clinic at any time with any questions, concerns, or complaints.   Cancer Staging Rectal cancer [facility]) Staging form: Colon and Rectum, AJCC 8th Edition - Clinical stage from 06/22/2020: Stage IIIB (cT3, cN1a, cM0) - Signed by Lloyd Huger, MD on 06/22/2020 Stage prefix: Initial diagnosis Total positive nodes: 1   Lloyd Huger, MD   08/15/2020 3:22 PM

## 2020-08-14 ENCOUNTER — Ambulatory Visit
Admission: RE | Admit: 2020-08-14 | Discharge: 2020-08-14 | Disposition: A | Discharge status: Home / Self Care | Payer: Self-pay | Source: Ambulatory Visit | Attending: Radiation Oncology | Admitting: Radiation Oncology

## 2020-08-14 ENCOUNTER — Other Ambulatory Visit: Payer: Self-pay | Admitting: *Deleted

## 2020-08-14 ENCOUNTER — Telehealth: Payer: Self-pay | Admitting: *Deleted

## 2020-08-14 ENCOUNTER — Ambulatory Visit: Admission: RE | Admit: 2020-08-14 | Payer: Self-pay | Source: Ambulatory Visit

## 2020-08-14 MED ORDER — OXYCODONE HCL ER 10 MG PO T12A: 10.0000 mg | EXTENDED_RELEASE_TABLET | Freq: Three times a day (TID) | ORAL | 0 refills | Status: DC

## 2020-08-14 NOTE — Telephone Encounter (Signed)
Entered in error

## 2020-08-15 ENCOUNTER — Inpatient Hospital Stay (HOSPITAL_BASED_OUTPATIENT_CLINIC_OR_DEPARTMENT_OTHER): Payer: Self-pay | Admitting: Hospice and Palliative Medicine

## 2020-08-15 ENCOUNTER — Inpatient Hospital Stay: Payer: Self-pay

## 2020-08-15 ENCOUNTER — Inpatient Hospital Stay: Payer: Self-pay | Admitting: Pharmacist

## 2020-08-15 ENCOUNTER — Ambulatory Visit
Admission: RE | Admit: 2020-08-15 | Discharge: 2020-08-15 | Disposition: A | Discharge status: Home / Self Care | Payer: Self-pay | Source: Ambulatory Visit | Attending: Radiation Oncology | Admitting: Radiation Oncology

## 2020-08-15 ENCOUNTER — Encounter: Payer: Self-pay | Admitting: Oncology

## 2020-08-15 ENCOUNTER — Inpatient Hospital Stay (HOSPITAL_BASED_OUTPATIENT_CLINIC_OR_DEPARTMENT_OTHER): Payer: Self-pay | Admitting: Oncology

## 2020-08-15 VITALS — Wt 170.7 lb

## 2020-08-15 DIAGNOSIS — C20 Malignant neoplasm of rectum: Secondary | ICD-10-CM

## 2020-08-15 DIAGNOSIS — G4700 Insomnia, unspecified: Secondary | ICD-10-CM

## 2020-08-15 DIAGNOSIS — G893 Neoplasm related pain (acute) (chronic): Secondary | ICD-10-CM

## 2020-08-15 DIAGNOSIS — F419 Anxiety disorder, unspecified: Secondary | ICD-10-CM

## 2020-08-15 DIAGNOSIS — Z515 Encounter for palliative care: Secondary | ICD-10-CM

## 2020-08-15 LAB — CBC WITH DIFFERENTIAL/PLATELET
Abs Immature Granulocytes: 0.02 10*3/uL (ref 0.00–0.07)
Basophils Absolute: 0 10*3/uL (ref 0.0–0.1)
Basophils Relative: 0 %
Eosinophils Absolute: 0.4 10*3/uL (ref 0.0–0.5)
Eosinophils Relative: 5 %
HCT: 41.4 % (ref 39.0–52.0)
Hemoglobin: 14.9 g/dL (ref 13.0–17.0)
Immature Granulocytes: 0 %
Lymphocytes Relative: 5 %
Lymphs Abs: 0.4 10*3/uL — ABNORMAL LOW (ref 0.7–4.0)
MCH: 34.5 pg — ABNORMAL HIGH (ref 26.0–34.0)
MCHC: 36 g/dL (ref 30.0–36.0)
MCV: 95.8 fL (ref 80.0–100.0)
Monocytes Absolute: 0.6 10*3/uL (ref 0.1–1.0)
Monocytes Relative: 8 %
Neutro Abs: 5.8 10*3/uL (ref 1.7–7.7)
Neutrophils Relative %: 82 %
Platelets: 269 10*3/uL (ref 150–400)
RBC: 4.32 MIL/uL (ref 4.22–5.81)
RDW: 14.6 % (ref 11.5–15.5)
WBC: 7.2 10*3/uL (ref 4.0–10.5)
nRBC: 0 % (ref 0.0–0.2)

## 2020-08-15 LAB — COMPREHENSIVE METABOLIC PANEL
ALT: 47 U/L — ABNORMAL HIGH (ref 0–44)
AST: 26 U/L (ref 15–41)
Albumin: 4.1 g/dL (ref 3.5–5.0)
Alkaline Phosphatase: 70 U/L (ref 38–126)
Anion gap: 10 (ref 5–15)
BUN: 8 mg/dL (ref 6–20)
CO2: 28 mmol/L (ref 22–32)
Calcium: 9.4 mg/dL (ref 8.9–10.3)
Chloride: 98 mmol/L (ref 98–111)
Creatinine, Ser: 0.88 mg/dL (ref 0.61–1.24)
GFR, Estimated: >60 mL/min (ref >60)
Glucose, Bld: 81 mg/dL (ref 70–99)
Potassium: 3.1 mmol/L — ABNORMAL LOW (ref 3.5–5.1)
Sodium: 136 mmol/L (ref 135–145)
Total Bilirubin: 0.5 mg/dL (ref 0.3–1.2)
Total Protein: 7.7 g/dL (ref 6.5–8.1)

## 2020-08-15 LAB — PHOSPHORUS: Phosphorus: 3.5 mg/dL (ref 2.5–4.6)

## 2020-08-15 LAB — MAGNESIUM: Magnesium: 1.8 mg/dL (ref 1.7–2.4)

## 2020-08-15 MED ORDER — MIRTAZAPINE 7.5 MG PO TABS: 7.5000 mg | ORAL_TABLET | Freq: Every day | ORAL | 2 refills | Status: DC

## 2020-08-15 NOTE — Progress Notes (Signed)
Pt seen in radiation clinic today.  Vitals and follow assessment obtained.  Pt reports having pain in bladder, difficulty voiding for passed 24 hours. Pt scheduled for surgery.

## 2020-08-15 NOTE — Progress Notes (Signed)
[facility]  [phone] [phone]  Patient Care Team: Littie Chiem Downing, MD as PCP - General (Family Medicine) [name], RN as Oncology Nurse Navigator   Name of the patient: Malik Lynch  [id number]  Dec 25, 1970   Date of visit: 08/15/20  HPI: Patient is a 50 y.o. male with stage IIIb rectal cancer. Planned treatment of Xeloda (capecitabine) and XRT.  Reason for Consult: Oral chemotherapy follow-up for capecitabine therapy.   PAST MEDICAL HISTORY: Past Medical History:  Diagnosis Date  . Family history of breast cancer   . Family history of colon cancer   . Family history of kidney cancer   . Family history of prostate cancer   . Family history of uterine cancer     PAST SURGICAL HISTORY: No past surgical history on file.  HEMATOLOGY/ONCOLOGY HISTORY:  Oncology History  Rectal cancer ([place])  06/22/2020 Initial Diagnosis   Rectal cancer ([place])   06/22/2020 Cancer Staging   Staging form: Colon and Rectum, AJCC 8th Edition - Clinical stage from 06/22/2020: Stage IIIB (cT3, cN1a, cM0) - Signed by Lloyd Huger, MD on 06/22/2020   06/22/2020 -  Chemotherapy   The patient had capecitabine (XELODA) 150 MG tablet, 150 mg (100 % of original dose 150 mg), Oral, 2 times daily after meals, 0 of 1 cycle, Start date: 06/29/2020, End date: -- Dose modification: 150 mg (original dose 150 mg, Cycle 1) capecitabine (XELODA) 500 MG tablet, 1,500 mg (100 % of original dose 1,500 mg), Oral, 2 times daily after meals, 0 of 1 cycle, Start date: 06/29/2020, End date: -- Dose modification: 1,500 mg (original dose 1,500 mg, Cycle 1)  for chemotherapy treatment.      ALLERGIES:  has No Known Allergies.  MEDICATIONS:  Current Outpatient Medications  Medication Sig Dispense Refill  . ALPRAZolam (XANAX) 0.5 MG tablet Take 1 tablet (0.5 mg total) by mouth 2 (two) times daily as needed for anxiety. 60  tablet 0  . capecitabine (XELODA) 500 MG tablet Take by mouth 2 (two) times daily after a meal. 1500 mg (3 tabs) twice a day.    . cyclobenzaprine (FLEXERIL) 10 MG tablet Take 1 tablet (10 mg total) by mouth 3 (three) times daily as needed for muscle spasms. 60 tablet 0  . ibuprofen (ADVIL,MOTRIN) 100 MG tablet Take 800 mg by mouth every 6 (six) hours as needed.    . loperamide (IMODIUM A-D) 2 MG tablet Take 2 mg by mouth 4 (four) times daily as needed for diarrhea or loose stools.    . ondansetron (ZOFRAN) 8 MG tablet Take 1 tablet (8 mg total) by mouth every 8 (eight) hours as needed for nausea or vomiting. 45 tablet 0  . oxyCODONE (OXYCONTIN) 10 mg 12 hr tablet Take 1 tablet (10 mg total) by mouth every 8 (eight) hours. 90 tablet 0  . oxyCODONE-acetaminophen (PERCOCET/ROXICET) 5-325 MG tablet Take 1-2 tablets by mouth every 4 (four) hours as needed for severe pain. 90 tablet 0  . polyethylene glycol (MIRALAX / GLYCOLAX) 17 g packet Take 17 g by mouth daily. (Patient not taking: Reported on 08/15/2020)    . prochlorperazine (COMPAZINE) 10 MG tablet Take 1 tablet (10 mg total) by mouth every 6 (six) hours as needed (Nausea or vomiting). 60 tablet 1  . senna (SENOKOT) 8.6 MG TABS tablet Take 1 tablet (8.6 mg total) by mouth daily. (Patient not taking: No sig reported) 120 tablet 3  . Sennosides-Docusate Sodium 8.6-50  MG CAPS Take 1 tablet by mouth as needed for constipation. (Patient not taking: Reported on 08/15/2020)    . tamsulosin (FLOMAX) 0.4 MG CAPS capsule Take 0.4 mg by mouth daily.    . traZODone (DESYREL) 150 MG tablet Take 1 tablet (150 mg total) by mouth at bedtime. 30 tablet 1   No current facility-administered medications for this visit.    VITAL SIGNS: There were no vitals taken for this visit. There were no vitals filed for this visit.  Estimated body mass index is 23.03 kg/m as calculated from the following:   Height as of 06/29/20: 6' 0.18" (1.833 m).   Weight as of an earlier  encounter on 08/15/20: 77.4 kg (170 lb 11.2 oz).  LABS: CBC:    Component Value Date/Time   WBC 7.2 08/15/2020 0815   HGB 14.9 08/15/2020 0815   HCT 41.4 08/15/2020 0815   PLT 269 08/15/2020 0815   MCV 95.8 08/15/2020 0815   NEUTROABS 5.8 08/15/2020 0815   LYMPHSABS 0.4 (L) 08/15/2020 0815   MONOABS 0.6 08/15/2020 0815   EOSABS 0.4 08/15/2020 0815   BASOSABS 0.0 08/15/2020 0815   Comprehensive Metabolic Panel:    Component Value Date/Time   NA 136 08/15/2020 0815   K 3.1 (L) 08/15/2020 0815   CL 98 08/15/2020 0815   CO2 28 08/15/2020 0815   BUN 8 08/15/2020 0815   CREATININE 0.88 08/15/2020 0815   GLUCOSE 81 08/15/2020 0815   CALCIUM 9.4 08/15/2020 0815   AST 26 08/15/2020 0815   ALT 47 (H) 08/15/2020 0815   ALKPHOS 70 08/15/2020 0815   BILITOT 0.5 08/15/2020 0815   PROT 7.7 08/15/2020 0815   ALBUMIN 4.1 08/15/2020 0815    RADIOGRAPHIC STUDIES: No results found.   Assessment and Plan-  Continue Xeloda 1500 mg BID with radiation, last day of RT/Xeloda is 2/17.    Oral Chemotherapy Side Effect/Intolerance:  -Fatigue: patient's fatigue has not worsened since last visit.   -Constipation and Diarrhea: patient reports that his main issue over the weekend was related to diarrhea. He states he was having a diarrhea bowel movement about once an hour over Sunday night. He was taking combination Imodium/Simethicone at this time, one after each bowel movement and took a total of 6. He states the combination Imodium with simethicone works better than Imodium alone. He was encouraged to let the office know when he has bowel movements of 4 or more above his normal. Anticipate bowel movements start to return to normal after RT and Xeloda completed this week.   -N/V: He reports few vomiting events over the weekend with two events being concurrent with diarrhea. He states before these episodes he consumed a milk shake, coffee or orange juice. Likely that these acidic/dairy products  exacerbated his current nausea/diarrhea. He was encouraged to continue with small, bland meals throughout the remainder of treatment.   -Other, skin: The patient's wife was concerned about an area on his foot last week, this has since resolved. No HFS presentation on hands or feet. He continues to report dry skin on his legs, he was encouraged to continue moisturizing these areas.  Oral Chemotherapy Adherence: no missed doses reported, they state they have not taken the 150 mg tablet, only 3x 500 mg tablets for the total dose of 1500 mg as instructed.  Medication Access Issues: none  No patient barriers to medication adherence identified.   Patient expressed understanding and was in agreement with this plan. He also understands that He can  call clinic at any time with any questions, concerns, or complaints.   Thank you for allowing me to participate in the care of this very pleasant patient.   Time Total: 20 mins  Visit consisted of counseling and education on dealing with issues of symptom management in the setting of serious and potentially life-threatening illness.Greater than 50%  of this time was spent counseling and coordinating care related to the above assessment and plan.  Visit completed by:  Eddie Candle, PharmD, BCPS PGY2 Hem/Onc Resident    Signed by: Darl Pikes, PharmD, BCPS, BCOP, CPP Hematology/Oncology Clinical Pharmacist Practitioner [facility]/HP/[facility] [phone]

## 2020-08-15 NOTE — Progress Notes (Signed)
[facility]  [phone] [phone]   Name: Malik Lynch Date: 08/15/2020 MRN: [id number]  DOB: 07-04-1970  Patient Care Team: Leonard Downing, MD as PCP - General (Family Medicine) [name], RN as Oncology Nurse Navigator    REASON FOR CONSULTATION: Malik Lynch is a 50 y.o. male with multiple medical problems including stage IIIb rectal cancer on XRT and neoadjuvant chemotherapy with capecitabine.  Patient has had pain, weight loss, and anxiety.  He was referred to palliative care to help address goals and manage ongoing symptoms.  SOCIAL HISTORY:     reports that he has been smoking cigarettes. He has been smoking about 1.00 pack per day. He has never used smokeless tobacco. He reports current alcohol use of about 1.0 standard drink of alcohol per week. He reports that he does not use drugs.  Patient is married and lives at home with his wife.  He has 2 sons who live nearby.  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    ADVANCE DIRECTIVES:  Not on file  CODE STATUS:   PAST MEDICAL HISTORY: Past Medical History:  Diagnosis Date  . Family history of breast cancer   . Family history of colon cancer   . Family history of kidney cancer   . Family history of prostate cancer   . Family history of uterine cancer     PAST SURGICAL HISTORY: No past surgical history on file.  HEMATOLOGY/ONCOLOGY HISTORY:  Oncology History  Rectal cancer ([place])  06/22/2020 Initial Diagnosis   Rectal cancer ([place])   06/22/2020 Cancer Staging   Staging form: Colon and Rectum, AJCC 8th Edition - Clinical stage from 06/22/2020: Stage IIIB (cT3, cN1a, cM0) - Signed by Lloyd Huger, MD on 06/22/2020   06/22/2020 -  Chemotherapy   The patient had capecitabine (XELODA) 150 MG tablet, 150 mg (100 % of original dose 150 mg), Oral, 2 times daily after meals, 0 of 1 cycle,  Start date: 06/29/2020, End date: -- Dose modification: 150 mg (original dose 150 mg, Cycle 1) capecitabine (XELODA) 500 MG tablet, 1,500 mg (100 % of original dose 1,500 mg), Oral, 2 times daily after meals, 0 of 1 cycle, Start date: 06/29/2020, End date: -- Dose modification: 1,500 mg (original dose 1,500 mg, Cycle 1)  for chemotherapy treatment.      ALLERGIES:  has No Known Allergies.  MEDICATIONS:  Current Outpatient Medications  Medication Sig Dispense Refill  . ALPRAZolam (XANAX) 0.5 MG tablet Take 1 tablet (0.5 mg total) by mouth 2 (two) times daily as needed for anxiety. 60 tablet 0  . capecitabine (XELODA) 500 MG tablet Take by mouth 2 (two) times daily after a meal. 1500 mg (3 tabs) twice a day.    . cyclobenzaprine (FLEXERIL) 10 MG tablet Take 1 tablet (10 mg total) by mouth 3 (three) times daily as needed for muscle spasms. 60 tablet 0  . ibuprofen (ADVIL,MOTRIN) 100 MG tablet Take 800 mg by mouth every 6 (six) hours as needed.    . loperamide (IMODIUM A-D) 2 MG tablet Take 2 mg by mouth 4 (four) times daily as needed for diarrhea or loose stools.    . ondansetron (ZOFRAN) 8 MG tablet Take 1 tablet (8 mg total) by mouth every 8 (eight) hours as needed for nausea or vomiting. 45 tablet 0  . oxyCODONE (OXYCONTIN) 10 mg 12 hr tablet Take 1 tablet (10 mg total)  by mouth every 8 (eight) hours. 90 tablet 0  . oxyCODONE-acetaminophen (PERCOCET/ROXICET) 5-325 MG tablet Take 1-2 tablets by mouth every 4 (four) hours as needed for severe pain. 90 tablet 0  . polyethylene glycol (MIRALAX / GLYCOLAX) 17 g packet Take 17 g by mouth daily. (Patient not taking: Reported on 08/15/2020)    . prochlorperazine (COMPAZINE) 10 MG tablet Take 1 tablet (10 mg total) by mouth every 6 (six) hours as needed (Nausea or vomiting). 60 tablet 1  . senna (SENOKOT) 8.6 MG TABS tablet Take 1 tablet (8.6 mg total) by mouth daily. (Patient not taking: No sig reported) 120 tablet 3  . Sennosides-Docusate Sodium  8.6-50 MG CAPS Take 1 tablet by mouth as needed for constipation. (Patient not taking: Reported on 08/15/2020)    . tamsulosin (FLOMAX) 0.4 MG CAPS capsule Take 0.4 mg by mouth daily.    . traZODone (DESYREL) 150 MG tablet Take 1 tablet (150 mg total) by mouth at bedtime. 30 tablet 1   No current facility-administered medications for this visit.    VITAL SIGNS: There were no vitals taken for this visit. There were no vitals filed for this visit.  Estimated body mass index is 23.03 kg/m as calculated from the following:   Height as of 06/29/20: 6' 0.18" (1.833 m).   Weight as of an earlier encounter on 08/15/20: 170 lb 11.2 oz (77.4 kg).  LABS: CBC:    Component Value Date/Time   WBC 7.2 08/15/2020 0815   HGB 14.9 08/15/2020 0815   HCT 41.4 08/15/2020 0815   PLT 269 08/15/2020 0815   MCV 95.8 08/15/2020 0815   NEUTROABS 5.8 08/15/2020 0815   LYMPHSABS 0.4 (L) 08/15/2020 0815   MONOABS 0.6 08/15/2020 0815   EOSABS 0.4 08/15/2020 0815   BASOSABS 0.0 08/15/2020 0815   Comprehensive Metabolic Panel:    Component Value Date/Time   NA 136 08/15/2020 0815   K 3.1 (L) 08/15/2020 0815   CL 98 08/15/2020 0815   CO2 28 08/15/2020 0815   BUN 8 08/15/2020 0815   CREATININE 0.88 08/15/2020 0815   GLUCOSE 81 08/15/2020 0815   CALCIUM 9.4 08/15/2020 0815   AST 26 08/15/2020 0815   ALT 47 (H) 08/15/2020 0815   ALKPHOS 70 08/15/2020 0815   BILITOT 0.5 08/15/2020 0815   PROT 7.7 08/15/2020 0815   ALBUMIN 4.1 08/15/2020 0815    RADIOGRAPHIC STUDIES: No results found.  PERFORMANCE STATUS (ECOG) : 1 - Symptomatic but completely ambulatory  Review of Systems Unless otherwise noted, a complete review of systems is negative.  Physical Exam General: NAD Pulmonary: Unlabored Extremities: no edema, no joint deformities Skin: no rashes Neurological: Grossly nonfocal  IMPRESSION: Routine follow-up visit.  Overall, patient reports that he is doing reasonably well.  He denies  significant changes or concerns.  Patient reports that pain is improved with OxyContin 10 mg every 8 hours.  He occasionally will take the Percocet as needed for breakthrough pain but has found it to be required less frequently than before.  Patient does endorse anxiety and insomnia.  He reports that he has to wake multiple times during the night to void.  He also has some difficulty initiating sleep but is found that that has improved some with use of alprazolam.  There was a request to increase the dose of alprazolam to 1 mg twice daily.  However, instead I suggested adding mirtazapine at bedtime to help with depression, anxiety, and sleep.  Patient was in agreement.  PLAN: -  Continue current scope of treatment -Alprazolam 0.5 mg twice daily as needed for anxiety -Start mirtazapine 7.5 mg nightly -Continue OxyContin/Percocet -RTC 1 month   Case and plan discussed with Dr. Grayland Ormond   Patient expressed understanding and was in agreement with this plan. He also understands that He can call the clinic at any time with any questions, concerns, or complaints.     Time Total: 25 minutes  Visit consisted of counseling and education dealing with the complex and emotionally intense issues of symptom management and palliative care in the setting of serious and potentially life-threatening illness.Greater than 50%  of this time was spent counseling and coordinating care related to the above assessment and plan.  Signed by: Altha Harm, PhD, NP-C

## 2020-08-16 ENCOUNTER — Ambulatory Visit
Admission: RE | Admit: 2020-08-16 | Discharge: 2020-08-16 | Disposition: A | Discharge status: Home / Self Care | Payer: Self-pay | Source: Ambulatory Visit | Attending: Radiation Oncology | Admitting: Radiation Oncology

## 2020-08-16 LAB — CEA: CEA: 5.9 ng/mL — ABNORMAL HIGH (ref 0.0–4.7)

## 2020-08-17 ENCOUNTER — Ambulatory Visit
Admission: RE | Admit: 2020-08-17 | Discharge: 2020-08-17 | Disposition: A | Discharge status: Home / Self Care | Payer: Self-pay | Source: Ambulatory Visit | Attending: Radiation Oncology | Admitting: Radiation Oncology

## 2020-08-24 ENCOUNTER — Encounter: Payer: Self-pay | Admitting: Physical Therapy

## 2020-08-24 ENCOUNTER — Ambulatory Visit: Payer: Self-pay | Attending: Family Medicine | Admitting: Physical Therapy

## 2020-08-24 ENCOUNTER — Inpatient Hospital Stay: Payer: Self-pay

## 2020-08-24 ENCOUNTER — Other Ambulatory Visit: Payer: Self-pay

## 2020-08-24 DIAGNOSIS — M6281 Muscle weakness (generalized): Secondary | ICD-10-CM | POA: Insufficient documentation

## 2020-08-24 DIAGNOSIS — R293 Abnormal posture: Secondary | ICD-10-CM | POA: Insufficient documentation

## 2020-08-24 NOTE — Patient Instructions (Addendum)
2-3 x day   Deep core level 1 -2 ( handout)     Clam Shell 45 Degrees  Lying with hips and knees bent 45, one pillow between knees and ankles. Heel together, toes apart like ballerina,  Lift knee with exhale while pressing heels together. Be sure pelvis does not roll backward. Do not arch back. Do 20 times, each leg, 2 times per day.     Complimentary stretch: [employer] _ foot over _ thigh, opposite knee straight  3 breaths  * Keep pelvis levelled with tactile cue with hand under back of hips  * Slide the ankle of the supporting foot out to decrease the angle which can help level the pelvis   ___  childs poses rocking  Toes tucked, shoulders down and back, on forearms , hands shoulder width apart  10 reps   __  Step back , feet hip width apart.   2 MIN  __  [email or web address]    __ Cancer Rehab PT  ( [place])

## 2020-08-25 NOTE — Therapy (Signed)
[place] MAIN [facility] SERVICES [address] [place], [place], [address] Phone: [phone]   Fax:  [phone]  Physical Therapy Evaluation  Patient Details  Name: Malik Lynch MRN: [id number] Date of Birth: [birth year] Referring Provider (PT): Finnagen   Encounter Date: 08/24/2020   PT End of Session - 08/25/20 1221    Visit Number 1    Number of Visits 4    Date for PT Re-Evaluation 09/22/20    Authorization Type self pay    PT Start Time 1705    PT Stop Time 1750    PT Time Calculation (min) 45 min    Activity Tolerance Patient tolerated treatment well    Behavior During Therapy [facility] for tasks assessed/performed           Past Medical History:  Diagnosis Date  . Family history of breast cancer   . Family history of colon cancer   . Family history of kidney cancer   . Family history of prostate cancer   . Family history of uterine cancer     History reviewed. No pertinent surgical history.  There were no vitals filed for this visit.    Subjective Assessment - 08/24/20 1714    Subjective 1) Pt has been in pain since the summer every time he went to sit down and to use the bathroom, riding in a vehicle, wiping behind. In October, pt was not able to pee anymore. Pt saw his MD who informed him that a giant mass located on colon and putting pressure on rectum and urethra which made it hard to pee and poo. Miralax helped with Miralax. Pt was provided medication to help make his urethra stiffer. Pt has been doing chemo for 6 weeks. Pt is going to meet his surgeon next week. Pt will undergo chemo infusion. Since radiation has completed,  appetite has returned but he is dealing with dizziness and weakness.  Pt is dizzy with walking from house and mailbox.    2) CLBP since injury in his 47s.  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 The past 2 weeks of radiation has made him lose his appetite, affec this  balance, tired and not able to walk his dog.                [facility] PT Assessment - 08/25/20 1229      Assessment   Medical Diagnosis rectal cancer    Referring Provider (PT) Finnagen      Precautions   Precautions None      Restrictions   Weight Bearing Restrictions No      Balance Screen   Has the patient fallen in the past 6 months No      Observation/Other Assessments   Observations slumped sitting , forward sitting      Coordination   Gross Motor Movements are Fluid and Coordinated --   diaphragm breathing with pelvic floor coordination     Strength   Overall Strength Comments hip abduction B 3/5                      Objective measurements completed on examination: See above findings.       [place] [facility] PT Treatment/Exercise - 08/25/20 1225      Neuro Re-ed    Neuro Re-ed Details  cued for HEP ( see pt instructions) , explained importance of strengtehning and ROM post radiation , toileting posture, sitting posture  PT Long Term Goals - 08/25/20 1223      PT LONG TERM GOAL #1   Title Pt will demo IND with deep core coordination and strengthening HEP    Time 2    Period Weeks    Status New    Target Date 09/08/20      PT LONG TERM GOAL #2   Title Pt will demo IND with hip ext stretch/ hip strengtehning HEP in order to minimize deconditioning/ limited ROM  post radiation    Time 4    Period Weeks    Status New    Target Date 09/22/20      PT LONG TERM GOAL #3   Title Pt will demo proper pelvic floor coordination without straining in order to improve function and proper sitting posture to minimize pain    Time 12    Period Weeks    Status New    Target Date 11/17/20                  Plan - 08/25/20 1222    Clinical Impression Statement Pt is a 50 yo who completed radiation Tx for rectal cancer and will soon undergo chemotherapy Tx. Pt reports swelling and pain at rectum and post -radiation  related Sx that include fatigue, dizzy, weakness.   Pt's assessment showed slumped posture, deep core and hip weakness. Pt was educated on proper sitting posture with pelvis elevated on towel to promote anterior tilt. Pt was also educated on deep core and hip abduction/ ext exercises to promote postural stability, pelvic function, and overall BLE strength for mobility which will help with motility. Pt was educated on activity pacting, short bouts of the HEP to minimize over fatigue. Educated pt would benefit from working with nutritionist and to follow up on applying proper lotion over irradiated area.  Pt would benefit from continued PT as pt undergoes further Tx for cancer to address musculoskeletal -related deficits and minimize falls.    Currently, pt is self-pay and applying for Medicaid. Provided a pamphlet of free resources in [place]. Provided on line resource on Youtube for Cancer PT.      Examination-Activity Limitations Continence;Dressing;Toileting;Locomotion Level    Stability/Clinical Decision Making Evolving/Moderate complexity    Clinical Decision Making Moderate    Rehab Potential Good    PT Frequency 1x / week    PT Duration 12 weeks    PT Treatment/Interventions Moist Heat;Therapeutic activities;Therapeutic exercise;Neuromuscular re-education;Manual techniques;Wheelchair mobility training;Scar mobilization;Patient/family education;Stair training;Taping;Splinting;Energy conservation;Joint Manipulations;Gait training;Electrical Stimulation    Consulted and Agree with Plan of Care Patient           Patient will benefit from skilled therapeutic intervention in order to improve the following deficits and impairments:  Decreased endurance,Decreased safety awareness,Increased muscle spasms,Decreased coordination,Decreased range of motion,Abnormal gait,Hypomobility,Decreased balance,Decreased mobility,Decreased strength,Postural dysfunction,Hypermobility,Difficulty  walking,Pain,Improper body mechanics,Decreased knowledge of precautions,Decreased activity tolerance  Visit Diagnosis: Abnormal posture  Muscle weakness (generalized)     Problem List Patient Active Problem List   Diagnosis Date Noted  . Family history of colon cancer   . Family history of uterine cancer   . Family history of breast cancer   . Family history of prostate cancer   . Family history of kidney cancer   . Rectal cancer ([place]) 06/22/2020  . 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 PANIC DISORDER 08/14/2007  . TOBACCO ABUSE 08/14/2007  . RECTAL BLEEDING, HX OF 08/14/2007    Jerl Mina ,PT, DPT, E-RYT  08/25/2020, 12:32 PM  [place] MAIN [facility] SERVICES [address] [place], [place], [address] Phone: [phone]   Fax:  [phone]  Name: ARYAN SPARKS MRN: [id number] Date of Birth: April 29, 1971

## 2020-08-26 NOTE — Progress Notes (Signed)
Malik Lynch  [phone]) [phone] [phone]) [phone]  ID: TRUST LEH OB: 07/12/1970  MR#: [id number]  MEQ#:[id number]  Patient Care Team: Leonard Downing, MD as PCP - General (Family Medicine) [name], RN as Oncology Nurse Navigator  CHIEF COMPLAINT: Stage IIIb rectal cancer.  INTERVAL HISTORY: Patient returns to clinic today for repeat laboratory work and further evaluation.  Since completing his treatment of XRT and capecitabine he feels mildly better.  He continues to have significant diarrhea, but this is improved.  He has chronic weakness and fatigue. He has intermittent abdominal pain.  He has difficulty urinating. He denies any melena or hematochezia.  He has no neurologic complaints. He denies any recent fevers or illnesses. He has no chest pain, shortness of breath, cough, or hemoptysis.  He denies any nausea or vomiting.  Patient offers no further specific complaints today.  REVIEW OF SYSTEMS:   Review of Systems  Constitutional: Positive for malaise/fatigue. Negative for fever and weight loss.  Respiratory: Negative.  Negative for cough, hemoptysis and shortness of breath.   Cardiovascular: Negative.  Negative for chest pain and leg swelling.  Gastrointestinal: Positive for abdominal pain and diarrhea. Negative for blood in stool and melena.  Genitourinary: Positive for urgency.  Musculoskeletal: Negative.  Negative for back pain.  Skin: Negative.  Negative for rash.  Neurological: Positive for weakness. Negative for dizziness, focal weakness and headaches.  Psychiatric/Behavioral: Negative.  The patient is not nervous/anxious.     As per HPI. Otherwise, a complete review of systems is negative.  PAST MEDICAL HISTORY: Past Medical History:  Diagnosis Date  . Family history of breast cancer   . Family history of colon cancer   . Family history of kidney cancer   . Family history of prostate cancer   . Family history of  uterine cancer     PAST SURGICAL HISTORY: History reviewed. No pertinent surgical history.  FAMILY HISTORY: Family History  Problem Relation Age of Onset  . Throat cancer Mother 53  . Lymphoma Maternal Aunt   . Kidney cancer Maternal Uncle   . Breast cancer Paternal Aunt   . Colon cancer Paternal Uncle   . Lung cancer Maternal Grandmother   . Cancer Maternal Grandmother        mouth  . Prostate cancer Maternal Grandfather        dx 3s  . Colon cancer Maternal Grandfather        dx 1s  . Lung cancer Maternal Uncle   . Cancer Maternal Uncle        possibly kidney  . Uterine cancer Paternal Aunt   . Cancer Paternal Aunt        reproductive  . Kidney cancer Paternal Uncle   . Breast cancer Cousin   . Breast cancer Cousin   . Colon cancer Cousin        dx 44s    ADVANCED DIRECTIVES (Y/N):  N  HEALTH MAINTENANCE: Social History   Tobacco Use  . Smoking status: Current Some Day Smoker    Packs/day: 1.00    Types: Cigarettes  . Smokeless tobacco: Never Used  Vaping Use  . Vaping Use: Never used  Substance Use Topics  . Alcohol use: Yes    Alcohol/week: 1.0 standard drink    Types: 1 Cans of beer per week  . Drug use: No     Colonoscopy:  PAP:  Bone density:  Lipid panel:  No Known Allergies  Current Outpatient Medications  Medication Sig Dispense Refill  . ALPRAZolam (XANAX) 0.5 MG tablet Take 1 tablet (0.5 mg total) by mouth 2 (two) times daily as needed for anxiety. 60 tablet 0  . capecitabine (XELODA) 500 MG tablet Take by mouth 2 (two) times daily after a meal. 1500 mg (3 tabs) twice a day.    . cyclobenzaprine (FLEXERIL) 10 MG tablet Take 1 tablet (10 mg total) by mouth 3 (three) times daily as needed for muscle spasms. 60 tablet 0  . ibuprofen (ADVIL,MOTRIN) 100 MG tablet Take 800 mg by mouth every 6 (six) hours as needed.    . loperamide (IMODIUM A-D) 2 MG tablet Take 2 mg by mouth 4 (four) times daily as needed for diarrhea or loose stools.    .  ondansetron (ZOFRAN) 8 MG tablet Take 1 tablet (8 mg total) by mouth every 8 (eight) hours as needed for nausea or vomiting. 45 tablet 0  . oxyCODONE (OXYCONTIN) 10 mg 12 hr tablet Take 1 tablet (10 mg total) by mouth every 8 (eight) hours. 90 tablet 0  . oxyCODONE-acetaminophen (PERCOCET/ROXICET) 5-325 MG tablet Take 1-2 tablets by mouth every 4 (four) hours as needed for severe pain. 90 tablet 0  . polyethylene glycol (MIRALAX / GLYCOLAX) 17 g packet Take 17 g by mouth daily.    . prochlorperazine (COMPAZINE) 10 MG tablet Take 1 tablet (10 mg total) by mouth every 6 (six) hours as needed (Nausea or vomiting). 60 tablet 1  . tamsulosin (FLOMAX) 0.4 MG CAPS capsule Take 0.4 mg by mouth daily.    Marland Kitchen senna (SENOKOT) 8.6 MG TABS tablet Take 1 tablet (8.6 mg total) by mouth daily. (Patient not taking: No sig reported) 120 tablet 3  . Sennosides-Docusate Sodium 8.6-50 MG CAPS Take 1 tablet by mouth as needed for constipation. (Patient not taking: No sig reported)     No current facility-administered medications for this visit.    OBJECTIVE: Vitals:   08/31/20 1031  BP: 109/84  Pulse: 93  Resp: 18  Temp: (!) 97.2 F (36.2 C)  SpO2: 99%     Body mass index is 23.61 kg/m.    ECOG FS:1 - Symptomatic but completely ambulatory  General: Well-developed, well-nourished, no acute distress. Eyes: Pink conjunctiva, anicteric sclera. HEENT: Normocephalic, moist mucous membranes. Lungs: No audible wheezing or coughing. Heart: Regular rate and rhythm. Abdomen: Soft, nontender, no obvious distention. Musculoskeletal: No edema, cyanosis, or clubbing. Neuro: Alert, answering all questions appropriately. Cranial nerves grossly intact. Skin: No rashes or petechiae noted. Psych: Normal affect.   LAB RESULTS:  Lab Results  Component Value Date   NA 137 08/31/2020   K 4.3 08/31/2020   CL 101 08/31/2020   CO2 26 08/31/2020   GLUCOSE 79 08/31/2020   BUN 10 08/31/2020   CREATININE 0.91 08/31/2020    CALCIUM 9.1 08/31/2020   PROT 7.0 08/31/2020   ALBUMIN 3.9 08/31/2020   AST 14 (L) 08/31/2020   ALT 13 08/31/2020   ALKPHOS 89 08/31/2020   BILITOT 0.5 08/31/2020   GFRNONAA >60 08/31/2020    Lab Results  Component Value Date   WBC 7.0 08/31/2020   NEUTROABS 5.6 08/31/2020   HGB 13.3 08/31/2020   HCT 39.3 08/31/2020   MCV 102.9 (H) 08/31/2020   PLT 261 08/31/2020     STUDIES: No results found.  ASSESSMENT: Stage IIIb rectal cancer.  PLAN:    1.  Stage IIIb rectal cancer: Pathology and imaging results reviewed independently.  EUS completed at outside facility confirmed stage  of disease.  PET scan results from June 28, 2020 reviewed independently with no obvious malignancy outside of patient's known rectal mass.  He will benefit from neoadjuvant chemotherapy using capecitabine along with daily XRT.  Patient's initial dose of capecitabine was 1650 mg daily 5 days a week along with XRT, but given his significant diarrhea and declining performance status, this was dose reduced to 1500 mg daily.  Patient completed his neoadjuvant treatment on August 17, 2020.  Return appointment with surgery recently, but wishes to pursue a second opinion at [facility].  Referral has been sent.  Return to clinic in 1 month for repeat laboratory work and further evaluation.  Appreciate palliative care input.  2.  Weight loss: Improved.  Appreciate dietary input. 3.  Pain: Chronic and unchanged.  Continue OxyContin every 8 hours and Percocet as needed. Patient does not wish to change his narcotics at this time.  4.  Left kidney mass: 2.8 x 3.6 x 3.4 mass highly concerning for a second malignancy.  Patient will likely have to undergo partial nephrectomy at the time of his rectal surgery.  [facility]erral [facility] as above. 5.  Urinary retention: [place] urology input.  Continue Flomax as prescribed. 6.  Diarrhea: Mildly improved.  Continue Imodium and Lomotil as prescribed. 7.  Dehydration: Patient continues to  decline IV fluids. 8.  Hypokalemia: Resolved. 9.  Anxiety: Appreciate palliative care input.   Patient expressed understanding and was in agreement with this plan. He also understands that He can call clinic at any time with any questions, concerns, or complaints.   Cancer Staging Rectal cancer [facility]) Staging form: Colon and Rectum, AJCC 8th Edition - Clinical stage from 06/22/2020: Stage IIIB (cT3, cN1a, cM0) - Signed by Lloyd Huger, MD on 06/22/2020 Stage prefix: Initial diagnosis Total positive nodes: 1   Lloyd Huger, MD   09/02/2020 7:03 AM

## 2020-08-28 ENCOUNTER — Telehealth: Payer: Self-pay | Admitting: Licensed Clinical Social Worker

## 2020-08-28 ENCOUNTER — Ambulatory Visit: Payer: Self-pay | Admitting: Licensed Clinical Social Worker

## 2020-08-28 ENCOUNTER — Encounter: Payer: Self-pay | Admitting: Licensed Clinical Social Worker

## 2020-08-28 DIAGNOSIS — C20 Malignant neoplasm of rectum: Secondary | ICD-10-CM

## 2020-08-28 DIAGNOSIS — Z803 Family history of malignant neoplasm of breast: Secondary | ICD-10-CM

## 2020-08-28 DIAGNOSIS — Z8049 Family history of malignant neoplasm of other genital organs: Secondary | ICD-10-CM

## 2020-08-28 DIAGNOSIS — Z8042 Family history of malignant neoplasm of prostate: Secondary | ICD-10-CM

## 2020-08-28 DIAGNOSIS — Z800 Family history of malignant neoplasm of digestive organs: Secondary | ICD-10-CM

## 2020-08-28 DIAGNOSIS — Z1379 Encounter for other screening for genetic and chromosomal anomalies: Secondary | ICD-10-CM | POA: Insufficient documentation

## 2020-08-28 DIAGNOSIS — Z8051 Family history of malignant neoplasm of kidney: Secondary | ICD-10-CM

## 2020-08-28 NOTE — Progress Notes (Signed)
HPI:  Mr. Gravely was previously seen in the [place] clinic due to a personal history of cancer and concerns regarding a hereditary predisposition to cancer. Please refer to our prior cancer genetics clinic note for more information regarding our discussion, assessment and recommendations, at the time. Mr. Razon recent genetic test results were disclosed to him, as were recommendations warranted by these results. These results and recommendations are discussed in more detail below.  CANCER HISTORY:  Oncology History  Rectal cancer ([place])  06/22/2020 Initial Diagnosis   Rectal cancer ([place])   06/22/2020 Cancer Staging   Staging form: Colon and Rectum, AJCC 8th Edition - Clinical stage from 06/22/2020: Stage IIIB (cT3, cN1a, cM0) - Signed by Lloyd Huger, MD on 06/22/2020   06/22/2020 -  Chemotherapy   The patient had capecitabine (XELODA) 150 MG tablet, 150 mg (100 % of original dose 150 mg), Oral, 2 times daily after meals, 0 of 1 cycle, Start date: 06/29/2020, End date: -- Dose modification: 150 mg (original dose 150 mg, Cycle 1) capecitabine (XELODA) 500 MG tablet, 1,500 mg (100 % of original dose 1,500 mg), Oral, 2 times daily after meals, 0 of 1 cycle, Start date: 06/29/2020, End date: -- Dose modification: 1,500 mg (original dose 1,500 mg, Cycle 1)  for chemotherapy treatment.     Genetic Testing   Negative genetic testing. No pathogenic variants identified on the [facility] CancerNext-Expanded+RNA panel. The report date is 08/18/2020.   The CancerNext-Expanded + RNAinsight gene panel offered by [employer] and includes sequencing and rearrangement analysis for the following 77 genes: IP, ALK, APC*, ATM*, AXIN2, BAP1, BARD1, BLM, BMPR1A, BRCA1*, BRCA2*, BRIP1*, CDC73, CDH1*,CDK4, CDKN1B, CDKN2A, CHEK2*, CTNNA1, DICER1, FANCC, FH, FLCN, GALNT12, KIF1B, LZTR1, MAX, MEN1, MET, MLH1*, MSH2*, MSH3, MSH6*, MUTYH*, NBN, NF1*, NF2, NTHL1, PALB2*, PHOX2B, PMS2*, POT1,  PRKAR1A, PTCH1, PTEN*, RAD51C*, RAD51D*,RB1, RECQL, RET, SDHA, SDHAF2, SDHB, SDHC, SDHD, SMAD4, SMARCA4, SMARCB1, SMARCE1, STK11, SUFU, TMEM127, TP53*,TSC1, TSC2, VHL and XRCC2 (sequencing and deletion/duplication); EGFR, EGLN1, HOXB13, KIT, MITF, PDGFRA, POLD1 and POLE (sequencing only); EPCAM and GREM1 (deletion/duplication only).      FAMILY HISTORY:  We obtained a detailed, 4-generation family history.  Significant diagnoses are listed below: Family History  Problem Relation Age of Onset  . Throat cancer Mother 81  . Lymphoma Maternal Aunt   . Kidney cancer Maternal Uncle   . Breast cancer Paternal Aunt   . Colon cancer Paternal Uncle   . Lung cancer Maternal Grandmother   . Cancer Maternal Grandmother        mouth  . Prostate cancer Maternal Grandfather        dx 35s  . Colon cancer Maternal Grandfather        dx 45s  . Lung cancer Maternal Uncle   . Cancer Maternal Uncle        possibly kidney  . Uterine cancer Paternal Aunt   . Cancer Paternal Aunt        reproductive  . Kidney cancer Paternal Uncle   . Breast cancer Cousin   . Breast cancer Cousin   . Colon cancer Cousin        dx 53s    Mr. Phagan has 2 sons, ages 48 and 37, neither have had colonoscopy or cancer.  Mr. Sells mother is living and had throat cancer recently at 51. Patient has 3 maternal aunts, 4 maternal uncles. An aunt had lymphoma. An uncle had kidney, another uncle possibly had kidney, and another uncle had  lung cancer. There is at least one maternal cousin who has had cancer, unknown types. Maternal grandmother had lung cancer at a young age, mouth cancer. Maternal grandfather had prostate cancer in his 48s and colon cancer in his 62s.   Mr. Egnor's father died at 26. Patient had 2 paternal uncles and 6 paternal aunts. One uncle had colon cnacer, another uncle had kidney cancer. An aunt had breast cancer, another aunt had cancer, unknown type but believed to be something reproductive.  Another aunt had uterine cancer in her 15s-60s. She had 3 daughters, 2 had breast cancer and 1 is currently going through colon cancer in her 96s. Paternal grandparents did not have history of cancer. Patient does note history of work exposure to chemicals on both sides of the family and for himself.   Mr. Clinkscale is unaware of previous family history of genetic testing for hereditary cancer risks. Patient's maternal ancestors are of unknown/European descent, and paternal ancestors are of unknown/European descent. There is no reported Ashkenazi Jewish ancestry. There is no known consanguinity.    GENETIC TEST RESULTS: Genetic testing reported out on 08/19/2019 through the [facility] CancerNext-Expanded cancer panel found no pathogenic mutations.   The CancerNext-Expanded + RNAinsight gene panel offered by [employer] and includes sequencing and rearrangement analysis for the following 77 genes: IP, ALK, APC*, ATM*, AXIN2, BAP1, BARD1, BLM, BMPR1A, BRCA1*, BRCA2*, BRIP1*, CDC73, CDH1*,CDK4, CDKN1B, CDKN2A, CHEK2*, CTNNA1, DICER1, FANCC, FH, FLCN, GALNT12, KIF1B, LZTR1, MAX, MEN1, MET, MLH1*, MSH2*, MSH3, MSH6*, MUTYH*, NBN, NF1*, NF2, NTHL1, PALB2*, PHOX2B, PMS2*, POT1, PRKAR1A, PTCH1, PTEN*, RAD51C*, RAD51D*,RB1, RECQL, RET, SDHA, SDHAF2, SDHB, SDHC, SDHD, SMAD4, SMARCA4, SMARCB1, SMARCE1, STK11, SUFU, TMEM127, TP53*,TSC1, TSC2, VHL and XRCC2 (sequencing and deletion/duplication); EGFR, EGLN1, HOXB13, KIT, MITF, PDGFRA, POLD1 and POLE (sequencing only); EPCAM and GREM1 (deletion/duplication only).   The test report has been scanned into EPIC and is located under the Molecular Pathology section of the Results Review tab.  A portion of the result report is included below for reference.     We discussed with Mr. Medley that because current genetic testing is not perfect, it is possible there may be a gene mutation in one of these genes that current testing cannot detect, but that chance is small.  We  also discussed, that there could be another gene that has not yet been discovered, or that we have not yet tested, that is responsible for the cancer diagnoses in the family. It is also possible there is a hereditary cause for the cancer in the family that Mr. Menon did not inherit and therefore was not identified in his testing.  Therefore, it is important to remain in touch with cancer genetics in the future so that we can continue to offer Mr. Bayless the most up to date genetic testing.   ADDITIONAL GENETIC TESTING: We discussed with Mr. Spurling that his genetic testing was fairly extensive.  If there are genes identified to increase cancer risk that can be analyzed in the future, we would be happy to discuss and coordinate this testing at that time.    CANCER SCREENING RECOMMENDATIONS: Mr. Vandyne's test result is considered negative (normal).  This means that we have not identified a hereditary cause for his  personal and family history of cancer at this time. Most cancers happen by chance and this negative test suggests that his cancer may fall into this category.    While reassuring, this does not definitively rule out a hereditary predisposition to cancer. It is  still possible that there could be genetic mutations that are undetectable by current technology. There could be genetic mutations in genes that have not been tested or identified to increase cancer risk.  Therefore, it is recommended he continue to follow the cancer management and screening guidelines provided by his oncology and primary healthcare provider.   An individual's cancer risk and medical management are not determined by genetic test results alone. Overall cancer risk assessment incorporates additional factors, including personal medical history, family history, and any available genetic information that may result in a personalized plan for cancer prevention and surveillance.  This negative genetic test simply tells [place]  that we cannot yet define why Mr. Mickelson has had colorectal cancer at a young age. Mr. Trentham's medical management and screening should be based on the prospect that he  may be at an increased risk for a second colorectal cancer in the future and should, therefore, undergo more frequent colonoscopy screening at intervals determined by his GI providers.    RECOMMENDATIONS FOR FAMILY MEMBERS:  Relatives in this family might be at some increased risk of developing cancer, over the general population risk, simply due to the family history of cancer.  We recommended male relatives in this family have a yearly mammogram beginning at age 61, or 43 years younger than the earliest onset of cancer, an annual clinical breast exam, and perform monthly breast self-exams. Male relatives in this family should also have a gynecological exam as recommended by their primary provider.  All family members should be referred for colonoscopy starting at age 39.   It is also possible there is a hereditary cause for the cancer in Mr. Rispoli's family that he did not inherit and therefore was not identified in him.  Based on Mr. Hendler's family history, we recommended paternal relatives have genetic counseling and testing. Mr. Uppal will let us know if we can be of any assistance in coordinating genetic counseling and/or testing for these family members.  FOLLOW-UP: Lastly, we discussed with Mr. Mroczka that cancer genetics is a rapidly advancing field and it is possible that new genetic tests will be appropriate for him and/or his family members in the future. We encouraged him to remain in contact with cancer genetics on an annual basis so we can update his personal and family histories and let him know of advances in cancer genetics that may benefit this family.   Our contact number was provided. Mr. Arganbright questions were answered to his satisfaction, and he knows he is welcome to call us at anytime with  additional questions or concerns.   Faith Rogue, MS, [facility] Genetic Counselor [email or web address] Phone: [phone]

## 2020-08-28 NOTE — Telephone Encounter (Signed)
Revealed negative genetic testing.   We discussed that we do not know why he has  cancer or why there is cancer in the family. It could be due to a different gene that we are not testing, or something our current technology cannot pick up.  It will be important for him to keep in contact with genetics to learn if additional testing may be needed in the future.  

## 2020-08-29 ENCOUNTER — Ambulatory Visit: Payer: Self-pay | Admitting: General Surgery

## 2020-08-29 NOTE — H&P (Signed)
The patient is a 50 year old male who presents with colorectal cancer. 50 year old male who presents to the office with approximately 2 years of worsening constipation leading to rectal bleeding and anal pain. He underwent a CT scan which was concerning for a rectal mass as well as a kidney mass. He then underwent a colonoscopy which shows a distal rectal mass abutting the dentate line. 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 CT scan of the chest shows no sign of metastatic disease. Rectal ultrasound shows a distal rectal mass approximately 5 mm from the anal verge abutting the prostate, but does not appear to invade this or the seminal vesicles. Several malignant-appearing lymph nodes were also noted in the mesentery. T3N1. He has recently completed neoadjuvant chemotherapy and radiation. He also has a left-sided kidney mass, which will require resection.   Problem List/Past Medical Leighton Ruff, MD; 07/06/1094 12:00 PM) RECTAL CANCER (C20)  Past Surgical History Leighton Ruff, MD; 0/09/5407 12:00 PM) No pertinent past surgical history  Diagnostic Studies History Leighton Ruff, MD; 01/29/1913 12:00 PM) Colonoscopy within last year  Allergies Janeann Forehand, CNA; 08/29/2020 11:27 AM) No Known Drug Allergies [06/08/2020]: Allergies Reconciled  Medication History Leighton Ruff, MD; 01/06/2955 12:00 PM) oxyCODONE HCl (10MG  Tablet, Oral) Active. No Current Medications (Taken starting 08/29/2020) oxyCODONE-Acetaminophen (5-325MG  Tablet, Oral) Active. ALPRAZolam (0.5MG  Tablet, Oral) Active. Ondansetron HCl (8MG  Tablet, Oral) Active. Capecitabine (150MG  Tablet, Oral) Active. Xeloda (500MG  Tablet, Oral) Active. traZODone HCl (150MG  Tablet, Oral) Active. Prochlorperazine Maleate (10MG  Tablet, Oral) Active. Tamsulosin HCl (0.4MG  Capsule, Oral) Active. PEG 3350-KCl-Na Bicarb-NaCl (420GM For Solution, Oral) Active. Medications Reconciled metroNIDAZOLE  (500MG  Tablet, 2 (two) Oral three times daily, Taken starting 08/29/2020) Active. (Pharmacy Instructions: Take 2 tablets at 2pm, 3pm, and 10pm the day prior to your colon operation.) Neomycin Sulfate (500MG  Tablet, 2 (two) Oral SEE NOTE, Taken starting 08/29/2020) Active. (TAKE TWO TABLETS AT 2 PM, 3 PM, AND 10 PM THE DAY PRIOR TO SURGERY)  Social History Leighton Ruff, MD; 08/01/3084 12:00 PM) Alcohol use Occasional alcohol use. Caffeine use Carbonated beverages, Coffee, Tea. Tobacco use Current some day smoker.  Family History Leighton Ruff, MD; 11/05/8467 12:00 PM) Alcohol Abuse Father. Arthritis Brother, Father, Mother. Colon Cancer Family Members In General. Hypertension Father. Prostate Cancer Family Members In General. Respiratory Condition Father.  Other Problems Leighton Ruff, MD; 12/01/9526 12:00 PM) Anxiety Disorder Back Pain Colon Cancer Rectal Cancer     Review of Systems Leighton Ruff MD; 09/30/3242 12:01 PM) General Present- Weight Loss. Not Present- Appetite Loss, Chills, Fatigue, Fever, Night Sweats and Weight Gain. Skin Not Present- Change in Wart/Mole, Dryness, Hives, Jaundice, New Lesions, Non-Healing Wounds, Rash and Ulcer. HEENT Present- Wears glasses/contact lenses. Not Present- Earache, Hearing Loss, Hoarseness, Nose Bleed, Oral Ulcers, Ringing in the Ears, Seasonal Allergies, Sinus Pain, Sore Throat, Visual Disturbances and Yellow Eyes. Respiratory Not Present- Bloody sputum, Chronic Cough, Difficulty Breathing, Snoring and Wheezing. Breast Not Present- Breast Mass, Breast Pain, Nipple Discharge and Skin Changes. Cardiovascular Not Present- Chest Pain, Difficulty Breathing Lying Down, Leg Cramps, Palpitations, Rapid Heart Rate, Shortness of Breath and Swelling of Extremities. Gastrointestinal Present- Bloating, Bloody Stool, Change in Bowel Habits, Constipation, Excessive gas, Gets full quickly at meals and Rectal Pain. Not Present- Abdominal Pain,  Chronic diarrhea, Difficulty Swallowing, Hemorrhoids, Indigestion, Nausea and Vomiting. Male Genitourinary Present- Frequency, Nocturia and Urgency. Not Present- Blood in Urine, Change in Urinary Stream, Impotence, Painful Urination and Urine Leakage. Musculoskeletal Present- Back Pain, Joint Pain, Joint Stiffness, Muscle Pain and  Swelling of Extremities. Not Present- Muscle Weakness. Neurological Not Present- Decreased Memory, Fainting, Headaches, Numbness, Seizures, Tingling, Tremor, Trouble walking and Weakness. Psychiatric Present- Anxiety and Change in Sleep Pattern. Not Present- Bipolar, Depression, Fearful and Frequent crying. Endocrine Not Present- Cold Intolerance, Excessive Hunger, Hair Changes, Heat Intolerance and New Diabetes. Hematology Not Present- Blood Thinners, Easy Bruising, Excessive bleeding, Gland problems, HIV and Persistent Infections.   Physical Exam Leighton Ruff MD; 03/01/9165 12:01 PM)  General Mental Status-Alert. General Appearance-Cooperative.  Abdomen Palpation/Percussion Palpation and Percussion of the abdomen reveal - Soft.  Rectal Note: Distal rectal mass involving the pelvic floor and sphincter complex in the right posterior region. Mass extends throughout the posterior rectum and covers approximately two thirds of the circumference.    Assessment & Plan Leighton Ruff MD; 0/11/43 12:02 PM)  RECTAL CANCER (C20) Impression: 50 year old male who presents to the office today with ongoing issues with bleeding and anal pain. He underwent a CT scan which showed a distal rectal mass. He then underwent a colonoscopy which showed the mass abutting the dentate line. 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 CT scan of the chest, abdomen and pelvis show no signs of metastatic disease. He is unable to undergo an MRI due to metal in his eye. An endoscopic rectal ultrasound was performed. This showed a T3 N1 rectal mass abutting the anal  sphincter complex as well as the prostate. He has now completed neoadjuvant chemotherapy and radiation. He is ready to discuss surgery. I have discussed abdominal peroneal resection with him in detail. The surgery and anatomy were described to the patient as well as the risks of surgery and the possible complications. These include: Bleeding, deep abdominal infections and possible wound complications such as hernia and infection, damage to adjacent structures, which can lead to other surgeries and need for a permanent ostomy, possible need for other procedures, such as abscess drains in radiology, possible prolonged hospital stay, possible diarrhea from removal of part of the colon, possible constipation from narcotics, possible bowel, bladder or sexual dysfunction if having rectal surgery, prolonged fatigue/weakness or appetite loss, possible early recurrence of of disease, possible complications of their medical problems such as heart disease or arrhythmias or lung problems, death (less than 1%). I believe the patient understands and wishes to proceed with the surgery.

## 2020-08-31 ENCOUNTER — Inpatient Hospital Stay (HOSPITAL_BASED_OUTPATIENT_CLINIC_OR_DEPARTMENT_OTHER): Payer: Self-pay | Admitting: Hospice and Palliative Medicine

## 2020-08-31 ENCOUNTER — Inpatient Hospital Stay: Payer: Self-pay

## 2020-08-31 ENCOUNTER — Encounter: Payer: Self-pay | Admitting: Oncology

## 2020-08-31 ENCOUNTER — Inpatient Hospital Stay: Payer: Self-pay | Attending: Oncology

## 2020-08-31 ENCOUNTER — Telehealth: Payer: Self-pay

## 2020-08-31 ENCOUNTER — Inpatient Hospital Stay (HOSPITAL_BASED_OUTPATIENT_CLINIC_OR_DEPARTMENT_OTHER): Payer: Self-pay | Admitting: Oncology

## 2020-08-31 VITALS — BP 109/84 | HR 93 | Temp 97.2°F | Resp 18 | Wt 175.0 lb

## 2020-08-31 DIAGNOSIS — Z800 Family history of malignant neoplasm of digestive organs: Secondary | ICD-10-CM | POA: Insufficient documentation

## 2020-08-31 DIAGNOSIS — Z801 Family history of malignant neoplasm of trachea, bronchus and lung: Secondary | ICD-10-CM | POA: Insufficient documentation

## 2020-08-31 DIAGNOSIS — E860 Dehydration: Secondary | ICD-10-CM | POA: Insufficient documentation

## 2020-08-31 DIAGNOSIS — F419 Anxiety disorder, unspecified: Secondary | ICD-10-CM | POA: Insufficient documentation

## 2020-08-31 DIAGNOSIS — R5383 Other fatigue: Secondary | ICD-10-CM | POA: Insufficient documentation

## 2020-08-31 DIAGNOSIS — Z806 Family history of leukemia: Secondary | ICD-10-CM | POA: Insufficient documentation

## 2020-08-31 DIAGNOSIS — R197 Diarrhea, unspecified: Secondary | ICD-10-CM | POA: Insufficient documentation

## 2020-08-31 DIAGNOSIS — R339 Retention of urine, unspecified: Secondary | ICD-10-CM | POA: Insufficient documentation

## 2020-08-31 DIAGNOSIS — N289 Disorder of kidney and ureter, unspecified: Secondary | ICD-10-CM | POA: Insufficient documentation

## 2020-08-31 DIAGNOSIS — Z8051 Family history of malignant neoplasm of kidney: Secondary | ICD-10-CM | POA: Insufficient documentation

## 2020-08-31 DIAGNOSIS — Z803 Family history of malignant neoplasm of breast: Secondary | ICD-10-CM | POA: Insufficient documentation

## 2020-08-31 DIAGNOSIS — Z7289 Other problems related to lifestyle: Secondary | ICD-10-CM | POA: Insufficient documentation

## 2020-08-31 DIAGNOSIS — C20 Malignant neoplasm of rectum: Secondary | ICD-10-CM

## 2020-08-31 DIAGNOSIS — Z808 Family history of malignant neoplasm of other organs or systems: Secondary | ICD-10-CM | POA: Insufficient documentation

## 2020-08-31 DIAGNOSIS — Z515 Encounter for palliative care: Secondary | ICD-10-CM

## 2020-08-31 DIAGNOSIS — K5900 Constipation, unspecified: Secondary | ICD-10-CM | POA: Insufficient documentation

## 2020-08-31 DIAGNOSIS — R634 Abnormal weight loss: Secondary | ICD-10-CM | POA: Insufficient documentation

## 2020-08-31 DIAGNOSIS — R109 Unspecified abdominal pain: Secondary | ICD-10-CM | POA: Insufficient documentation

## 2020-08-31 DIAGNOSIS — R531 Weakness: Secondary | ICD-10-CM | POA: Insufficient documentation

## 2020-08-31 DIAGNOSIS — F17210 Nicotine dependence, cigarettes, uncomplicated: Secondary | ICD-10-CM | POA: Insufficient documentation

## 2020-08-31 LAB — CBC WITH DIFFERENTIAL/PLATELET
Abs Immature Granulocytes: 0.02 10*3/uL (ref 0.00–0.07)
Basophils Absolute: 0 10*3/uL (ref 0.0–0.1)
Basophils Relative: 1 %
Eosinophils Absolute: 0.2 10*3/uL (ref 0.0–0.5)
Eosinophils Relative: 3 %
HCT: 39.3 % (ref 39.0–52.0)
Hemoglobin: 13.3 g/dL (ref 13.0–17.0)
Immature Granulocytes: 0 %
Lymphocytes Relative: 9 %
Lymphs Abs: 0.6 10*3/uL — ABNORMAL LOW (ref 0.7–4.0)
MCH: 34.8 pg — ABNORMAL HIGH (ref 26.0–34.0)
MCHC: 33.8 g/dL (ref 30.0–36.0)
MCV: 102.9 fL — ABNORMAL HIGH (ref 80.0–100.0)
Monocytes Absolute: 0.5 10*3/uL (ref 0.1–1.0)
Monocytes Relative: 8 %
Neutro Abs: 5.6 10*3/uL (ref 1.7–7.7)
Neutrophils Relative %: 79 %
Platelets: 261 10*3/uL (ref 150–400)
RBC: 3.82 MIL/uL — ABNORMAL LOW (ref 4.22–5.81)
RDW: 16.2 % — ABNORMAL HIGH (ref 11.5–15.5)
WBC: 7 10*3/uL (ref 4.0–10.5)
nRBC: 0 % (ref 0.0–0.2)

## 2020-08-31 LAB — COMPREHENSIVE METABOLIC PANEL
ALT: 13 U/L (ref 0–44)
AST: 14 U/L — ABNORMAL LOW (ref 15–41)
Albumin: 3.9 g/dL (ref 3.5–5.0)
Alkaline Phosphatase: 89 U/L (ref 38–126)
Anion gap: 10 (ref 5–15)
BUN: 10 mg/dL (ref 6–20)
CO2: 26 mmol/L (ref 22–32)
Calcium: 9.1 mg/dL (ref 8.9–10.3)
Chloride: 101 mmol/L (ref 98–111)
Creatinine, Ser: 0.91 mg/dL (ref 0.61–1.24)
GFR, Estimated: >60 mL/min (ref >60)
Glucose, Bld: 79 mg/dL (ref 70–99)
Potassium: 4.3 mmol/L (ref 3.5–5.1)
Sodium: 137 mmol/L (ref 135–145)
Total Bilirubin: 0.5 mg/dL (ref 0.3–1.2)
Total Protein: 7 g/dL (ref 6.5–8.1)

## 2020-08-31 LAB — MAGNESIUM: Magnesium: 2.2 mg/dL (ref 1.7–2.4)

## 2020-08-31 LAB — PHOSPHORUS: Phosphorus: 4 mg/dL (ref 2.5–4.6)

## 2020-08-31 NOTE — Progress Notes (Signed)
Patient here for oncology follow-up appointment, expresses concerns of constipation and chronic urinary problems.

## 2020-08-31 NOTE — Progress Notes (Signed)
Nutrition Follow-up:  Patient with rectal cancer.  Patient has completed radiation.   Saw surgeon but requesting 2nd opinion.    Met with patient and wife following MD and NP.  Patient reports appetite has improved after competing treatment.  Nausea less.  Issues with constipation being addressed.  Reports that he is sleeping better.  Breakfast has been egg, toast with mayo and tomato. Lunch yesterday was vegetable beef soup. Dinner last night was 2 hot dogs with chili and fries.  Upset his stomach some after this meal.  Verbalized that he needs to eat blander foods to cause less pain and stomach upset.      Medications: reviewed  Labs: reviewed  Anthropometrics:   Weight 175 lb today increased from 170 lb on 2/15  179 lb on 2/1   NUTRITION DIAGNOSIS: Inadequate oral intake improving   INTERVENTION:  Patient aware blander foods cause less GI upset. Stressed importance of good nutrition, including good protein foods in days leading up to surgery.      MONITORING, EVALUATION, GOAL: weight trends, intake   NEXT VISIT: March 31, phone call  Joli B. Zenia Resides, [place], [place] Registered Dietitian [phone] (mobile)

## 2020-08-31 NOTE — Telephone Encounter (Signed)
Referral sent to Dr. Cecil Cobbs at [facility] per patient request.

## 2020-08-31 NOTE — Progress Notes (Signed)
[place]  [phone] [phone]   Name: Malik Lynch Date: 08/31/2020 MRN: [id number]  DOB: 1971/02/24  Patient Care Team: Leonard Downing, MD as PCP - General (Family Medicine) [name], RN as Oncology Nurse Navigator    REASON FOR CONSULTATION: Malik Lynch is a 50 y.o. male with multiple medical problems including stage IIIb rectal cancer on XRT and neoadjuvant chemotherapy with capecitabine.  Patient has had pain, weight loss, and anxiety.  He was referred to palliative care to help address goals and manage ongoing symptoms.  SOCIAL HISTORY:     reports that he has been smoking cigarettes. He has been smoking about 1.00 pack per day. He has never used smokeless tobacco. He reports current alcohol use of about 1.0 standard drink of alcohol per week. He reports that he does not use drugs.  Patient is married and lives at home with his wife.  He has 2 sons who live nearby.  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    ADVANCE DIRECTIVES:  Not on file  CODE STATUS:   PAST MEDICAL HISTORY: Past Medical History:  Diagnosis Date  . Family history of breast cancer   . Family history of colon cancer   . Family history of kidney cancer   . Family history of prostate cancer   . Family history of uterine cancer     PAST SURGICAL HISTORY: No past surgical history on file.  HEMATOLOGY/ONCOLOGY HISTORY:  Oncology History  Rectal cancer ([place])  06/22/2020 Initial Diagnosis   Rectal cancer ([place])   06/22/2020 Cancer Staging   Staging form: Colon and Rectum, AJCC 8th Edition - Clinical stage from 06/22/2020: Stage IIIB (cT3, cN1a, cM0) - Signed by Lloyd Huger, MD on 06/22/2020   06/22/2020 -  Chemotherapy   The patient had capecitabine (XELODA) 150 MG tablet, 150 mg (100 % of original dose 150 mg), Oral, 2 times daily after meals, 0 of 1 cycle,  Start date: 06/29/2020, End date: -- Dose modification: 150 mg (original dose 150 mg, Cycle 1) capecitabine (XELODA) 500 MG tablet, 1,500 mg (100 % of original dose 1,500 mg), Oral, 2 times daily after meals, 0 of 1 cycle, Start date: 06/29/2020, End date: -- Dose modification: 1,500 mg (original dose 1,500 mg, Cycle 1)  for chemotherapy treatment.     Genetic Testing   Negative genetic testing. No pathogenic variants identified on the [employer] CancerNext-Expanded+RNA panel. The report date is 08/18/2020.   The CancerNext-Expanded + RNAinsight gene panel offered by [employer] and includes sequencing and rearrangement analysis for the following 77 genes: IP, ALK, APC*, ATM*, AXIN2, BAP1, BARD1, BLM, BMPR1A, BRCA1*, BRCA2*, BRIP1*, CDC73, CDH1*,CDK4, CDKN1B, CDKN2A, CHEK2*, CTNNA1, DICER1, FANCC, FH, FLCN, GALNT12, KIF1B, LZTR1, MAX, MEN1, MET, MLH1*, MSH2*, MSH3, MSH6*, MUTYH*, NBN, NF1*, NF2, NTHL1, PALB2*, PHOX2B, PMS2*, POT1, PRKAR1A, PTCH1, PTEN*, RAD51C*, RAD51D*,RB1, RECQL, RET, SDHA, SDHAF2, SDHB, SDHC, SDHD, SMAD4, SMARCA4, SMARCB1, SMARCE1, STK11, SUFU, TMEM127, TP53*,TSC1, TSC2, VHL and XRCC2 (sequencing and deletion/duplication); EGFR, EGLN1, HOXB13, KIT, MITF, PDGFRA, POLD1 and POLE (sequencing only); EPCAM and GREM1 (deletion/duplication only).      ALLERGIES:  has No Known Allergies.  MEDICATIONS:  Current Outpatient Medications  Medication Sig Dispense Refill  . ALPRAZolam (XANAX) 0.5 MG tablet Take 1 tablet (0.5 mg total) by mouth 2 (two) times daily as needed for anxiety. 60 tablet 0  . capecitabine (XELODA) 500 MG tablet Take by mouth  2 (two) times daily after a meal. 1500 mg (3 tabs) twice a day.    . cyclobenzaprine (FLEXERIL) 10 MG tablet Take 1 tablet (10 mg total) by mouth 3 (three) times daily as needed for muscle spasms. 60 tablet 0  . ibuprofen (ADVIL,MOTRIN) 100 MG tablet Take 800 mg by mouth every 6 (six) hours as needed.    . loperamide (IMODIUM A-D) 2 MG tablet  Take 2 mg by mouth 4 (four) times daily as needed for diarrhea or loose stools.    . mirtazapine (REMERON) 7.5 MG tablet Take 1 tablet (7.5 mg total) by mouth at bedtime. 30 tablet 2  . ondansetron (ZOFRAN) 8 MG tablet Take 1 tablet (8 mg total) by mouth every 8 (eight) hours as needed for nausea or vomiting. 45 tablet 0  . oxyCODONE (OXYCONTIN) 10 mg 12 hr tablet Take 1 tablet (10 mg total) by mouth every 8 (eight) hours. 90 tablet 0  . oxyCODONE-acetaminophen (PERCOCET/ROXICET) 5-325 MG tablet Take 1-2 tablets by mouth every 4 (four) hours as needed for severe pain. 90 tablet 0  . polyethylene glycol (MIRALAX / GLYCOLAX) 17 g packet Take 17 g by mouth daily.    . prochlorperazine (COMPAZINE) 10 MG tablet Take 1 tablet (10 mg total) by mouth every 6 (six) hours as needed (Nausea or vomiting). 60 tablet 1  . senna (SENOKOT) 8.6 MG TABS tablet Take 1 tablet (8.6 mg total) by mouth daily. (Patient not taking: No sig reported) 120 tablet 3  . Sennosides-Docusate Sodium 8.6-50 MG CAPS Take 1 tablet by mouth as needed for constipation. (Patient not taking: No sig reported)    . tamsulosin (FLOMAX) 0.4 MG CAPS capsule Take 0.4 mg by mouth daily.     No current facility-administered medications for this visit.    VITAL SIGNS: There were no vitals taken for this visit. There were no vitals filed for this visit.  Estimated body mass index is 23.61 kg/m as calculated from the following:   Height as of 06/29/20: 6' 0.18" (1.833 m).   Weight as of an earlier encounter on 08/31/20: 175 lb (79.4 kg).  LABS: CBC:    Component Value Date/Time   WBC 7.0 08/31/2020 0955   HGB 13.3 08/31/2020 0955   HCT 39.3 08/31/2020 0955   PLT 261 08/31/2020 0955   MCV 102.9 (H) 08/31/2020 0955   NEUTROABS 5.6 08/31/2020 0955   LYMPHSABS 0.6 (L) 08/31/2020 0955   MONOABS 0.5 08/31/2020 0955   EOSABS 0.2 08/31/2020 0955   BASOSABS 0.0 08/31/2020 0955   Comprehensive Metabolic Panel:    Component Value Date/Time    NA 137 08/31/2020 0955   K 4.3 08/31/2020 0955   CL 101 08/31/2020 0955   CO2 26 08/31/2020 0955   BUN 10 08/31/2020 0955   CREATININE 0.91 08/31/2020 0955   GLUCOSE 79 08/31/2020 0955   CALCIUM 9.1 08/31/2020 0955   AST 14 (L) 08/31/2020 0955   ALT 13 08/31/2020 0955   ALKPHOS 89 08/31/2020 0955   BILITOT 0.5 08/31/2020 0955   PROT 7.0 08/31/2020 0955   ALBUMIN 3.9 08/31/2020 0955    RADIOGRAPHIC STUDIES: No results found.  PERFORMANCE STATUS (ECOG) : 1 - Symptomatic but completely ambulatory  Review of Systems Unless otherwise noted, a complete review of systems is negative.  Physical Exam General: NAD Pulmonary: Unlabored Extremities: no edema, no joint deformities Skin: no rashes Neurological: Grossly nonfocal  IMPRESSION: Routine follow-up visit.  Overall, patient reports that he is doing reasonably well.  He denies significant changes or concerns.  Patient reports that pain is improved on current regimen of OxyContin every 8 hours.  He continues to require Percocet on occasion for breakthrough pain but overall frequency has lessened over time.  Anxiety is also reportedly well managed on alprazolam.  I did start patient on mirtazapine to help with anxiety and sleep but he reports that he did not tolerate it and self discontinued after several days.  He was having severe dry mouth in the a.m.  Patient says that he has been sleeping better recently.  Patient verbalizes some frustration with the surgical consult.  He is requested a 2nd opinion.  PLAN: -Continue current scope of treatment -Continue alprazolam 0.5 mg twice daily as needed for anxiety -Continue OxyContin/Percocet -RTC 1 month   Case and plan discussed with Dr. Grayland Ormond   Patient expressed understanding and was in agreement with this plan. He also understands that He can call the clinic at any time with any questions, concerns, or complaints.     Time Total: 15 minutes  Visit consisted of  counseling and education dealing with the complex and emotionally intense issues of symptom management and palliative care in the setting of serious and potentially life-threatening illness.Greater than 50%  of this time was spent counseling and coordinating care related to the above assessment and plan.  Signed by: Altha Harm, PhD, NP-C

## 2020-09-01 LAB — CEA: CEA: 5.5 ng/mL — ABNORMAL HIGH (ref 0.0–4.7)

## 2020-09-04 ENCOUNTER — Telehealth: Payer: Self-pay | Admitting: Radiation Oncology

## 2020-09-04 NOTE — Telephone Encounter (Signed)
Pt's wife (Amy) left VM with answering service to r/s her husband's appt w/ Dr. Baruch Gouty on 09/14/20. She can be reached at [phone].

## 2020-09-13 ENCOUNTER — Other Ambulatory Visit: Payer: Self-pay | Admitting: *Deleted

## 2020-09-13 MED ORDER — OXYCODONE HCL ER 10 MG PO T12A: 10.0000 mg | EXTENDED_RELEASE_TABLET | Freq: Three times a day (TID) | ORAL | 0 refills | Status: DC

## 2020-09-13 MED ORDER — CYCLOBENZAPRINE HCL 10 MG PO TABS: 10.0000 mg | ORAL_TABLET | Freq: Three times a day (TID) | ORAL | 0 refills | Status: DC | PRN

## 2020-09-13 MED ORDER — OXYCODONE-ACETAMINOPHEN 5-325 MG PO TABS: 1.0000 | ORAL_TABLET | ORAL | 0 refills | Status: DC | PRN

## 2020-09-14 ENCOUNTER — Ambulatory Visit: Payer: Self-pay | Admitting: Radiation Oncology

## 2020-09-21 ENCOUNTER — Ambulatory Visit
Admission: RE | Admit: 2020-09-21 | Discharge: 2020-09-21 | Disposition: A | Discharge status: Home / Self Care | Payer: Self-pay | Source: Ambulatory Visit | Attending: Radiation Oncology | Admitting: Radiation Oncology

## 2020-09-21 ENCOUNTER — Encounter: Payer: Self-pay | Admitting: Radiation Oncology

## 2020-09-21 ENCOUNTER — Telehealth: Payer: Self-pay

## 2020-09-21 ENCOUNTER — Inpatient Hospital Stay: Payer: Self-pay

## 2020-09-21 ENCOUNTER — Inpatient Hospital Stay (HOSPITAL_BASED_OUTPATIENT_CLINIC_OR_DEPARTMENT_OTHER): Payer: Self-pay | Admitting: Hospice and Palliative Medicine

## 2020-09-21 ENCOUNTER — Other Ambulatory Visit: Payer: Self-pay | Admitting: Oncology

## 2020-09-21 ENCOUNTER — Inpatient Hospital Stay (HOSPITAL_BASED_OUTPATIENT_CLINIC_OR_DEPARTMENT_OTHER): Payer: Self-pay | Admitting: Oncology

## 2020-09-21 VITALS — BP 123/78 | HR 99 | Temp 97.7°F | Resp 16 | Wt 172.9 lb

## 2020-09-21 VITALS — BP 123/79 | HR 99 | Temp 97.7°F | Resp 16 | Wt 172.0 lb

## 2020-09-21 DIAGNOSIS — Z923 Personal history of irradiation: Secondary | ICD-10-CM | POA: Insufficient documentation

## 2020-09-21 DIAGNOSIS — K6289 Other specified diseases of anus and rectum: Secondary | ICD-10-CM | POA: Insufficient documentation

## 2020-09-21 DIAGNOSIS — C20 Malignant neoplasm of rectum: Secondary | ICD-10-CM | POA: Insufficient documentation

## 2020-09-21 DIAGNOSIS — Z515 Encounter for palliative care: Secondary | ICD-10-CM

## 2020-09-21 DIAGNOSIS — G893 Neoplasm related pain (acute) (chronic): Secondary | ICD-10-CM

## 2020-09-21 LAB — PHOSPHORUS: Phosphorus: 3.9 mg/dL (ref 2.5–4.6)

## 2020-09-21 LAB — CBC WITH DIFFERENTIAL/PLATELET
Abs Immature Granulocytes: 0.01 10*3/uL (ref 0.00–0.07)
Basophils Absolute: 0 10*3/uL (ref 0.0–0.1)
Basophils Relative: 0 %
Eosinophils Absolute: 0.2 10*3/uL (ref 0.0–0.5)
Eosinophils Relative: 2 %
HCT: 37.6 % — ABNORMAL LOW (ref 39.0–52.0)
Hemoglobin: 13.1 g/dL (ref 13.0–17.0)
Immature Granulocytes: 0 %
Lymphocytes Relative: 9 %
Lymphs Abs: 0.7 10*3/uL (ref 0.7–4.0)
MCH: 34.5 pg — ABNORMAL HIGH (ref 26.0–34.0)
MCHC: 34.8 g/dL (ref 30.0–36.0)
MCV: 98.9 fL (ref 80.0–100.0)
Monocytes Absolute: 0.7 10*3/uL (ref 0.1–1.0)
Monocytes Relative: 9 %
Neutro Abs: 6.2 10*3/uL (ref 1.7–7.7)
Neutrophils Relative %: 80 %
Platelets: 332 10*3/uL (ref 150–400)
RBC: 3.8 MIL/uL — ABNORMAL LOW (ref 4.22–5.81)
RDW: 14.5 % (ref 11.5–15.5)
WBC: 7.8 10*3/uL (ref 4.0–10.5)
nRBC: 0 % (ref 0.0–0.2)

## 2020-09-21 LAB — COMPREHENSIVE METABOLIC PANEL
ALT: 15 U/L (ref 0–44)
AST: 16 U/L (ref 15–41)
Albumin: 3.9 g/dL (ref 3.5–5.0)
Alkaline Phosphatase: 92 U/L (ref 38–126)
Anion gap: 11 (ref 5–15)
BUN: 11 mg/dL (ref 6–20)
CO2: 23 mmol/L (ref 22–32)
Calcium: 9.1 mg/dL (ref 8.9–10.3)
Chloride: 99 mmol/L (ref 98–111)
Creatinine, Ser: 0.87 mg/dL (ref 0.61–1.24)
GFR, Estimated: >60 mL/min (ref >60)
Glucose, Bld: 98 mg/dL (ref 70–99)
Potassium: 3.8 mmol/L (ref 3.5–5.1)
Sodium: 133 mmol/L — ABNORMAL LOW (ref 135–145)
Total Bilirubin: 0.4 mg/dL (ref 0.3–1.2)
Total Protein: 7.4 g/dL (ref 6.5–8.1)

## 2020-09-21 LAB — MAGNESIUM: Magnesium: 2.1 mg/dL (ref 1.7–2.4)

## 2020-09-21 MED ORDER — LIDOCAINE-PRILOCAINE 2.5-2.5 % EX CREA: TOPICAL_CREAM | CUTANEOUS | 3 refills | Status: AC

## 2020-09-21 MED ORDER — LACTULOSE 10 GM/15ML PO SOLN: 10.0000 g | Freq: Two times a day (BID) | ORAL | 0 refills | Status: DC | PRN

## 2020-09-21 NOTE — Progress Notes (Signed)
[place]  [phone] [phone]   Name: Malik Lynch Date: 09/21/2020 MRN: [id number]  DOB: 05/29/1971  Patient Care Team: Leonard Downing, MD as PCP - General (Family Medicine) [name], RN as Oncology Nurse Navigator    REASON FOR CONSULTATION: Malik Lynch is a 50 y.o. male with multiple medical problems including stage IIIb rectal cancer on XRT and neoadjuvant chemotherapy with capecitabine.  Patient has had pain, weight loss, and anxiety.  He was referred to palliative care to help address goals and manage ongoing symptoms.  SOCIAL HISTORY:     reports that he has been smoking cigarettes. He has been smoking about 1.00 pack per day. He has never used smokeless tobacco. He reports current alcohol use of about 1.0 standard drink of alcohol per week. He reports that he does not use drugs.  Patient is married and lives at home with his wife.  He has 2 sons who live nearby.  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    ADVANCE DIRECTIVES:  Not on file  CODE STATUS:   PAST MEDICAL HISTORY: Past Medical History:  Diagnosis Date  . Family history of breast cancer   . Family history of colon cancer   . Family history of kidney cancer   . Family history of prostate cancer   . Family history of uterine cancer     PAST SURGICAL HISTORY: No past surgical history on file.  HEMATOLOGY/ONCOLOGY HISTORY:  Oncology History  Rectal cancer ([place])  06/22/2020 Initial Diagnosis   Rectal cancer ([place])   06/22/2020 Cancer Staging   Staging form: Colon and Rectum, AJCC 8th Edition - Clinical stage from 06/22/2020: Stage IIIB (cT3, cN1a, cM0) - Signed by Lloyd Huger, MD on 06/22/2020   06/22/2020 -  Chemotherapy   The patient had capecitabine (XELODA) 150 MG tablet, 150 mg (100 % of original dose 150 mg), Oral, 2 times daily after meals, 0 of 1 cycle,  Start date: 06/29/2020, End date: -- Dose modification: 150 mg (original dose 150 mg, Cycle 1) capecitabine (XELODA) 500 MG tablet, 1,500 mg (100 % of original dose 1,500 mg), Oral, 2 times daily after meals, 0 of 1 cycle, Start date: 06/29/2020, End date: -- Dose modification: 1,500 mg (original dose 1,500 mg, Cycle 1)  for chemotherapy treatment.     Genetic Testing   Negative genetic testing. No pathogenic variants identified on the [employer] CancerNext-Expanded+RNA panel. The report date is 08/18/2020.   The CancerNext-Expanded + RNAinsight gene panel offered by [employer] and includes sequencing and rearrangement analysis for the following 77 genes: IP, ALK, APC*, ATM*, AXIN2, BAP1, BARD1, BLM, BMPR1A, BRCA1*, BRCA2*, BRIP1*, CDC73, CDH1*,CDK4, CDKN1B, CDKN2A, CHEK2*, CTNNA1, DICER1, FANCC, FH, FLCN, GALNT12, KIF1B, LZTR1, MAX, MEN1, MET, MLH1*, MSH2*, MSH3, MSH6*, MUTYH*, NBN, NF1*, NF2, NTHL1, PALB2*, PHOX2B, PMS2*, POT1, PRKAR1A, PTCH1, PTEN*, RAD51C*, RAD51D*,RB1, RECQL, RET, SDHA, SDHAF2, SDHB, SDHC, SDHD, SMAD4, SMARCA4, SMARCB1, SMARCE1, STK11, SUFU, TMEM127, TP53*,TSC1, TSC2, VHL and XRCC2 (sequencing and deletion/duplication); EGFR, EGLN1, HOXB13, KIT, MITF, PDGFRA, POLD1 and POLE (sequencing only); EPCAM and GREM1 (deletion/duplication only).      ALLERGIES:  has No Known Allergies.  MEDICATIONS:  Current Outpatient Medications  Medication Sig Dispense Refill  . lactulose (CHRONULAC) 10 GM/15ML solution Take 15-30 mLs (10-20 g total) by mouth 2 (two) times daily as needed for severe constipation. 236 mL 0  . ALPRAZolam (XANAX) 0.5 MG tablet Take 1  tablet (0.5 mg total) by mouth 2 (two) times daily as needed for anxiety. 60 tablet 0  . capecitabine (XELODA) 500 MG tablet Take by mouth 2 (two) times daily after a meal. 1500 mg (3 tabs) twice a day.    . cyclobenzaprine (FLEXERIL) 10 MG tablet Take 1 tablet (10 mg total) by mouth 3 (three) times daily as needed for muscle spasms. 60  tablet 0  . ibuprofen (ADVIL,MOTRIN) 100 MG tablet Take 800 mg by mouth every 6 (six) hours as needed.    . loperamide (IMODIUM A-D) 2 MG tablet Take 2 mg by mouth 4 (four) times daily as needed for diarrhea or loose stools.    . ondansetron (ZOFRAN) 8 MG tablet Take 1 tablet (8 mg total) by mouth every 8 (eight) hours as needed for nausea or vomiting. 45 tablet 0  . oxyCODONE (OXYCONTIN) 10 mg 12 hr tablet Take 1 tablet (10 mg total) by mouth every 8 (eight) hours. 90 tablet 0  . oxyCODONE-acetaminophen (PERCOCET/ROXICET) 5-325 MG tablet Take 1-2 tablets by mouth every 4 (four) hours as needed for severe pain. 90 tablet 0  . polyethylene glycol (MIRALAX / GLYCOLAX) 17 g packet Take 17 g by mouth daily.    . prochlorperazine (COMPAZINE) 10 MG tablet Take 1 tablet (10 mg total) by mouth every 6 (six) hours as needed (Nausea or vomiting). 60 tablet 1  . senna (SENOKOT) 8.6 MG TABS tablet Take 1 tablet (8.6 mg total) by mouth daily. (Patient not taking: No sig reported) 120 tablet 3  . Sennosides-Docusate Sodium 8.6-50 MG CAPS Take 1 tablet by mouth as needed for constipation. (Patient not taking: No sig reported)    . tamsulosin (FLOMAX) 0.4 MG CAPS capsule Take 0.4 mg by mouth daily.     No current facility-administered medications for this visit.    VITAL SIGNS: There were no vitals taken for this visit. There were no vitals filed for this visit.  Estimated body mass index is 23.21 kg/m as calculated from the following:   Height as of 06/29/20: 6' 0.18" (1.833 m).   Weight as of an earlier encounter on 09/21/20: 172 lb (78 kg).  LABS: CBC:    Component Value Date/Time   WBC 7.8 09/21/2020 0929   HGB 13.1 09/21/2020 0929   HCT 37.6 (L) 09/21/2020 0929   PLT 332 09/21/2020 0929   MCV 98.9 09/21/2020 0929   NEUTROABS 6.2 09/21/2020 0929   LYMPHSABS 0.7 09/21/2020 0929   MONOABS 0.7 09/21/2020 0929   EOSABS 0.2 09/21/2020 0929   BASOSABS 0.0 09/21/2020 0929   Comprehensive Metabolic  Panel:    Component Value Date/Time   NA 133 (L) 09/21/2020 0929   K 3.8 09/21/2020 0929   CL 99 09/21/2020 0929   CO2 23 09/21/2020 0929   BUN 11 09/21/2020 0929   CREATININE 0.87 09/21/2020 0929   GLUCOSE 98 09/21/2020 0929   CALCIUM 9.1 09/21/2020 0929   AST 16 09/21/2020 0929   ALT 15 09/21/2020 0929   ALKPHOS 92 09/21/2020 0929   BILITOT 0.4 09/21/2020 0929   PROT 7.4 09/21/2020 0929   ALBUMIN 3.9 09/21/2020 0929    RADIOGRAPHIC STUDIES: No results found.  PERFORMANCE STATUS (ECOG) : 1 - Symptomatic but completely ambulatory  Review of Systems Unless otherwise noted, a complete review of systems is negative.  Physical Exam General: NAD Pulmonary: Unlabored Extremities: no edema, no joint deformities Skin: no rashes Neurological: Grossly nonfocal  IMPRESSION: Routine follow-up visit.  Patient reports that he  is doing worse since last seen.  He sought a second opinion from surgeon at [facility] and seems more comfortable with the future consideration of surgery.  However, he has had more pain and discomfort with need to self catheterize.  Patient also says that he is having worse constipation.  It is unclear how much of the pain is secondary to constipation versus discomfort secondary to the catheterization process.  He describes difficulty with catheterizing past his prostate.  I suggested that he should speak with urology for consideration of a coud catheter.  Regarding constipation, patient is taking senna 2 tablets twice daily and MiraLAX once or twice daily.  He still feels constipated despite having a bowel movement every other day or so.  We will start him on lactulose as needed.  He says he is watching the clock to see if it is time for his pain medications and is frequently relying on Percocet for breakthrough pain.  Discussed option of increasing his OxyContin for better pain control.  However, more opioid medications would only worsen his constipation.  We will see if  pain improves with a more aggressive bowel regimen.  Patient endorses significant financial stress due to his inability to work.  Will refer to [facility], [facility].  PLAN: -Continue current scope of treatment -Continue alprazolam 0.5 mg twice daily as needed for anxiety -Continue OxyContin/Percocet -Start lactulose 15 to 30 mL twice daily as needed for constipation -Referral to [facility], [place] visit 1 month   Case and plan discussed with Dr. Grayland Ormond   Patient expressed understanding and was in agreement with this plan. He also understands that He can call the clinic at any time with any questions, concerns, or complaints.     Time Total: 15 minutes  Visit consisted of counseling and education dealing with the complex and emotionally intense issues of symptom management and palliative care in the setting of serious and potentially life-threatening illness.Greater than 50%  of this time was spent counseling and coordinating care related to the above assessment and plan.  Signed by: Altha Harm, PhD, NP-C

## 2020-09-21 NOTE — Telephone Encounter (Signed)
Port placement scheduled on Fri 4/1 @ 8:30a to arrive at 7:30a. IR nurse will call with instructions a day or two prior to the procedure.   LVM for pt to call back in regards to his appointment to make sure he is aware.

## 2020-09-21 NOTE — Progress Notes (Signed)
[facility]  [phone] [phone]) [phone]  ID: Malik Lynch OB: 1970-08-29  MR#: [id number]  MPN#:[id number]  Patient Care Team: Nikelle Malatesta Downing, MD as PCP - General (Family Medicine) [name], RN as Oncology Nurse Navigator  CHIEF COMPLAINT: Stage IIIb rectal cancer.  INTERVAL HISTORY: Patient returns to clinic today for further evaluation and treatment planning.  He was recently evaluated by surgery and recommended to pursue neoadjuvant FOLFOX prior to surgery.  Patient states he was feeling well up until several weeks ago and then started having significant issues with his bladder and urination and now is required to perform self catheterizations.  He continues to have diarrhea, but this has improved. He has chronic weakness and fatigue. He has intermittent abdominal pain.  He denies any melena or hematochezia.  He has no neurologic complaints. He denies any recent fevers or illnesses. He has no chest pain, shortness of breath, cough, or hemoptysis.  He denies any nausea or vomiting.  Patient offers no further specific complaints today.  REVIEW OF SYSTEMS:   Review of Systems  Constitutional: Positive for malaise/fatigue. Negative for fever and weight loss.  Respiratory: Negative.  Negative for cough, hemoptysis and shortness of breath.   Cardiovascular: Negative.  Negative for chest pain and leg swelling.  Gastrointestinal: Positive for abdominal pain and diarrhea. Negative for blood in stool and melena.  Genitourinary: Positive for urgency.  Musculoskeletal: Negative.  Negative for back pain.  Skin: Negative.  Negative for rash.  Neurological: Positive for weakness. Negative for dizziness, focal weakness and headaches.  Psychiatric/Behavioral: Negative.  The patient is not nervous/anxious.     As per HPI. Otherwise, a complete review of systems is negative.  PAST MEDICAL HISTORY: Past Medical History:  Diagnosis Date  . Family  history of breast cancer   . Family history of colon cancer   . Family history of kidney cancer   . Family history of prostate cancer   . Family history of uterine cancer     PAST SURGICAL HISTORY: No past surgical history on file.  FAMILY HISTORY: Family History  Problem Relation Age of Onset  . Throat cancer Mother 52  . Lymphoma Maternal Aunt   . Kidney cancer Maternal Uncle   . Breast cancer Paternal Aunt   . Colon cancer Paternal Uncle   . Lung cancer Maternal Grandmother   . Cancer Maternal Grandmother        mouth  . Prostate cancer Maternal Grandfather        dx 4s  . Colon cancer Maternal Grandfather        dx 54s  . Lung cancer Maternal Uncle   . Cancer Maternal Uncle        possibly kidney  . Uterine cancer Paternal Aunt   . Cancer Paternal Aunt        reproductive  . Kidney cancer Paternal Uncle   . Breast cancer Cousin   . Breast cancer Cousin   . Colon cancer Cousin        dx 74s    ADVANCED DIRECTIVES (Y/N):  N  HEALTH MAINTENANCE: Social History   Tobacco Use  . Smoking status: Current Some Day Smoker    Packs/day: 1.00    Types: Cigarettes  . Smokeless tobacco: Never Used  Vaping Use  . Vaping Use: Never used  Substance Use Topics  . Alcohol use: Yes    Alcohol/week: 1.0 standard drink    Types: 1 Cans of beer per week  .  Drug use: No     Colonoscopy:  PAP:  Bone density:  Lipid panel:  No Known Allergies  Current Outpatient Medications  Medication Sig Dispense Refill  . ALPRAZolam (XANAX) 0.5 MG tablet Take 1 tablet (0.5 mg total) by mouth 2 (two) times daily as needed for anxiety. 60 tablet 0  . capecitabine (XELODA) 500 MG tablet Take by mouth 2 (two) times daily after a meal. 1500 mg (3 tabs) twice a day.    . cyclobenzaprine (FLEXERIL) 10 MG tablet Take 1 tablet (10 mg total) by mouth 3 (three) times daily as needed for muscle spasms. 60 tablet 0  . ibuprofen (ADVIL,MOTRIN) 100 MG tablet Take 800 mg by mouth every 6 (six)  hours as needed.    . lactulose (CHRONULAC) 10 GM/15ML solution Take 15-30 mLs (10-20 g total) by mouth 2 (two) times daily as needed for severe constipation. 236 mL 0  . loperamide (IMODIUM A-D) 2 MG tablet Take 2 mg by mouth 4 (four) times daily as needed for diarrhea or loose stools.    . ondansetron (ZOFRAN) 8 MG tablet Take 1 tablet (8 mg total) by mouth every 8 (eight) hours as needed for nausea or vomiting. 45 tablet 0  . oxyCODONE (OXYCONTIN) 10 mg 12 hr tablet Take 1 tablet (10 mg total) by mouth every 8 (eight) hours. 90 tablet 0  . oxyCODONE-acetaminophen (PERCOCET/ROXICET) 5-325 MG tablet Take 1-2 tablets by mouth every 4 (four) hours as needed for severe pain. 90 tablet 0  . polyethylene glycol (MIRALAX / GLYCOLAX) 17 g packet Take 17 g by mouth daily.    . prochlorperazine (COMPAZINE) 10 MG tablet Take 1 tablet (10 mg total) by mouth every 6 (six) hours as needed (Nausea or vomiting). 60 tablet 1  . senna (SENOKOT) 8.6 MG TABS tablet Take 1 tablet (8.6 mg total) by mouth daily. (Patient not taking: No sig reported) 120 tablet 3  . Sennosides-Docusate Sodium 8.6-50 MG CAPS Take 1 tablet by mouth as needed for constipation. (Patient not taking: No sig reported)    . tamsulosin (FLOMAX) 0.4 MG CAPS capsule Take 0.4 mg by mouth daily.     No current facility-administered medications for this visit.    OBJECTIVE: Vitals:   09/21/20 1000  BP: 123/79  Pulse: 99  Resp: 16  Temp: 97.7 F (36.5 C)     Body mass index is 23.21 kg/m.    ECOG FS:1 - Symptomatic but completely ambulatory  General: Well-developed, well-nourished, no acute distress. Eyes: Pink conjunctiva, anicteric sclera. HEENT: Normocephalic, moist mucous membranes. Lungs: No audible wheezing or coughing. Heart: Regular rate and rhythm. Abdomen: Soft, nontender, no obvious distention. Musculoskeletal: No edema, cyanosis, or clubbing. Neuro: Alert, answering all questions appropriately. Cranial nerves grossly  intact. Skin: No rashes or petechiae noted. Psych: Normal affect.  LAB RESULTS:  Lab Results  Component Value Date   NA 133 (L) 09/21/2020   K 3.8 09/21/2020   CL 99 09/21/2020   CO2 23 09/21/2020   GLUCOSE 98 09/21/2020   BUN 11 09/21/2020   CREATININE 0.87 09/21/2020   CALCIUM 9.1 09/21/2020   PROT 7.4 09/21/2020   ALBUMIN 3.9 09/21/2020   AST 16 09/21/2020   ALT 15 09/21/2020   ALKPHOS 92 09/21/2020   BILITOT 0.4 09/21/2020   GFRNONAA >60 09/21/2020    Lab Results  Component Value Date   WBC 7.8 09/21/2020   NEUTROABS 6.2 09/21/2020   HGB 13.1 09/21/2020   HCT 37.6 (L) 09/21/2020  MCV 98.9 09/21/2020   PLT 332 09/21/2020     STUDIES: No results found.  ASSESSMENT: Stage IIIb rectal cancer.  PLAN:    1.  Stage IIIb rectal cancer: Pathology and imaging results reviewed independently.  EUS completed at outside facility confirmed stage of disease.  PET scan results from June 28, 2020 reviewed independently with no obvious malignancy outside of patient's known rectal mass.  Patient completed his neoadjuvant treatment with chemotherapy and XRT on August 17, 2020.  His recent appointment with [facility] GI surgery recommended continued neoadjuvant treatment with FOLFOX prior to surgical resection.  Patient does not wish to use capecitabine any further.  He will require port placement next week and then return to clinic in approximately 2 weeks to initiate cycle 1 of 8 of neoadjuvant FOLFOX.  2.  Weight loss: Improved.  Appreciate dietary input. 3.  Pain: Improved.  Continue OxyContin every 8 hours and Percocet as needed.  Appreciate palliative care input.   4.  Left kidney mass: 2.8 x 3.6 x 3.4 mass highly concerning for a second malignancy.  Patient will likely have to undergo partial nephrectomy at the time of his rectal surgery.  5.  Urinary retention: [place] [facility] input.  Patient is now doing self-catheterization. 6.  Diarrhea: Essentially chronic and unchanged.   Continue Imodium and Lomotil as prescribed. 7.  Hypokalemia: Resolved. 8.  Anxiety: Appreciate palliative care input.  Patient expressed understanding and was in agreement with this plan. He also understands that He can call clinic at any time with any questions, concerns, or complaints.   Cancer Staging Rectal cancer [facility]) Staging form: Colon and Rectum, AJCC 8th Edition - Clinical stage from 06/22/2020: Stage IIIB (cT3, cN1a, cM0) - Signed by Lloyd Huger, MD on 06/22/2020 Stage prefix: Initial diagnosis Total positive nodes: 1   Lloyd Huger, MD   09/21/2020 2:37 PM

## 2020-09-21 NOTE — Progress Notes (Signed)
Radiation Oncology Follow up Note  Name: Malik Lynch   Date:   09/21/2020 MRN:  [id number] DOB: January 20, 1971    This 50 y.o. male presents to the clinic today for 1 month follow-up status post completion of neoadjuvant chemotherapy for stage IIIb (T3N1 a M0) adenocarcinoma the rectum.  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  Patient has been complaining about rectal pain and discomfort and he has been self catheterizing himself.  He feels this may be due to his treatment although he originally presented with inability urinate secondary to sphincter involvement of his cancer.  He has been seen at [facility] with recommendation for further chemotherapy prior to surgical resection..  COMPLICATIONS OF TREATMENT: none  FOLLOW UP COMPLIANCE: keeps appointments   PHYSICAL EXAM:  BP 123/78   Pulse 99   Temp 97.7 F (36.5 C)   Resp 16   Wt 172 lb 14.4 oz (78.4 kg)   SpO2 100%   BMI 23.33 kg/m  Well-developed well-nourished patient in NAD. HEENT reveals PERLA, EOMI, discs not visualized.  Oral cavity is clear. No oral mucosal lesions are identified. Neck is clear without evidence of cervical or supraclavicular adenopathy. Lungs are clear to A&P. Cardiac examination is essentially unremarkable with regular rate and rhythm without murmur rub or thrill. Abdomen is benign with no organomegaly or masses noted. Motor sensory and DTR levels are equal and symmetric in the upper and lower extremities. Cranial nerves II through XII are grossly intact. Proprioception is intact. No peripheral adenopathy or edema is identified. No motor or sensory levels are noted. Crude visual fields are within normal range.  RADIOLOGY RESULTS: No current films for review  PLAN: Present time patient will be  followed up with medical oncology and [facility] surgical oncology.  Decision to go with further chemotherapy versus postoperative chemotherapy will be made by the patient and medical oncology.  I have explained to the patient that his symptoms preceded any of our treatments and were certainly in due to tumor involvement of his locally advanced disease.  Patient has declined any further follow-up with me.  I would like to take this opportunity to thank you for allowing me to participate in the care of your patient.Noreene Filbert, MD

## 2020-09-21 NOTE — Progress Notes (Signed)
DISCONTINUE ON PATHWAY REGIMEN - Colorectal     Administer Monday through Friday:     Capecitabine   **Always confirm dose/schedule in your pharmacy ordering system**  REASON: Continuation Of Treatment PRIOR TREATMENT: [id number]: Capecitabine 825 mg/m2  PO BID (Monday - Friday) for the Duration of Radiation Therapy TREATMENT RESPONSE: Partial Response (PR)  START ON PATHWAY REGIMEN - Colorectal     A cycle is every 14 days:     Oxaliplatin      Leucovorin      Fluorouracil      Fluorouracil   **Always confirm dose/schedule in your pharmacy ordering system**  Patient Characteristics: Preoperative or Nonsurgical Candidate (Clinical Staging), Rectal, cT3 - cT4, cN0 or Any cT, cN+ Tumor Location: Rectal Therapeutic Status: Preoperative or Nonsurgical Candidate (Clinical Staging) AJCC T Category: cT3 AJCC N Category: cN1a AJCC M Category: cM0 AJCC 8 Stage Grouping: IIIB Intent of Therapy: Curative Intent, Not Discussed with Patient

## 2020-09-22 LAB — CEA: CEA: 6 ng/mL — ABNORMAL HIGH (ref 0.0–4.7)

## 2020-09-25 NOTE — Patient Instructions (Signed)
Oxaliplatin Injection What is this medicine? OXALIPLATIN (ox AL i PLA tin) is a chemotherapy drug. It targets fast dividing cells, like cancer cells, and causes these cells to die. This medicine is used to treat cancers of the colon and rectum, and many other cancers. This medicine may be used for other purposes; ask your health care provider or pharmacist if you have questions. COMMON BRAND NAME(S): Eloxatin What should I tell my health care provider before I take this medicine? They need to know if you have any of these conditions:  heart disease  history of irregular heartbeat  liver disease  low blood counts, like white cells, platelets, or red blood cells  lung or breathing disease, like asthma  take medicines that treat or prevent blood clots  tingling of the fingers or toes, or other nerve disorder  an unusual or allergic reaction to oxaliplatin, other chemotherapy, other medicines, foods, dyes, or preservatives  pregnant or trying to get pregnant  breast-feeding How should I use this medicine? This drug is given as an infusion into a vein. It is administered in a hospital or clinic by a specially trained health care professional. Talk to your pediatrician regarding the use of this medicine in children. Special care may be needed. Overdosage: If you think you have taken too much of this medicine contact a poison control center or emergency room at once. NOTE: This medicine is only for you. Do not share this medicine with others. What if I miss a dose? It is important not to miss a dose. Call your doctor or health care professional if you are unable to keep an appointment. What may interact with this medicine? Do not take this medicine with any of the following medications:  cisapride  dronedarone  pimozide  thioridazine This medicine may also interact with the following medications:  aspirin and aspirin-like medicines  certain medicines that treat or prevent  blood clots like warfarin, apixaban, dabigatran, and rivaroxaban  cisplatin  cyclosporine  diuretics  medicines for infection like acyclovir, adefovir, amphotericin B, bacitracin, cidofovir, foscarnet, ganciclovir, gentamicin, pentamidine, vancomycin  NSAIDs, medicines for pain and inflammation, like ibuprofen or naproxen  other medicines that prolong the QT interval (an abnormal heart rhythm)  pamidronate  zoledronic acid This list may not describe all possible interactions. Give your health care provider a list of all the medicines, herbs, non-prescription drugs, or dietary supplements you use. Also tell them if you smoke, drink alcohol, or use illegal drugs. Some items may interact with your medicine. What should I watch for while using this medicine? Your condition will be monitored carefully while you are receiving this medicine. You may need blood work done while you are taking this medicine. This medicine may make you feel generally unwell. This is not uncommon as chemotherapy can affect healthy cells as well as cancer cells. Report any side effects. Continue your course of treatment even though you feel ill unless your healthcare professional tells you to stop. This medicine can make you more sensitive to cold. Do not drink cold drinks or use ice. Cover exposed skin before coming in contact with cold temperatures or cold objects. When out in cold weather wear warm clothing and cover your mouth and nose to warm the air that goes into your lungs. Tell your doctor if you get sensitive to the cold. Do not become pregnant while taking this medicine or for 9 months after stopping it. Women should inform their health care professional if they wish to become   pregnant or think they might be pregnant. Men should not father a child while taking this medicine and for 6 months after stopping it. There is potential for serious side effects to an unborn child. Talk to your health care professional  for more information. Do not breast-feed a child while taking this medicine or for 3 months after stopping it. This medicine has caused ovarian failure in some women. This medicine may make it more difficult to get pregnant. Talk to your health care professional if you are concerned about your fertility. This medicine has caused decreased sperm counts in some men. This may make it more difficult to father a child. Talk to your health care professional if you are concerned about your fertility. This medicine may increase your risk of getting an infection. Call your health care professional for advice if you get a fever, chills, or sore throat, or other symptoms of a cold or flu. Do not treat yourself. Try to avoid being around people who are sick. Avoid taking medicines that contain aspirin, acetaminophen, ibuprofen, naproxen, or ketoprofen unless instructed by your health care professional. These medicines may hide a fever. Be careful brushing or flossing your teeth or using a toothpick because you may get an infection or bleed more easily. If you have any dental work done, tell your dentist you are receiving this medicine. What side effects may I notice from receiving this medicine? Side effects that you should report to your doctor or health care professional as soon as possible:  allergic reactions like skin rash, itching or hives, swelling of the face, lips, or tongue  breathing problems  cough  low blood counts - this medicine may decrease the number of white blood cells, red blood cells, and platelets. You may be at increased risk for infections and bleeding  nausea, vomiting  pain, redness, or irritation at site where injected  pain, tingling, numbness in the hands or feet  signs and symptoms of bleeding such as bloody or black, tarry stools; red or dark brown urine; spitting up blood or brown material that looks like coffee grounds; red spots on the skin; unusual bruising or bleeding  from the eyes, gums, or nose  signs and symptoms of a dangerous change in heartbeat or heart rhythm like chest pain; dizziness; fast, irregular heartbeat; palpitations; feeling faint or lightheaded; falls  signs and symptoms of infection like fever; chills; cough; sore throat; pain or trouble passing urine  signs and symptoms of liver injury like dark yellow or brown urine; general ill feeling or flu-like symptoms; light-colored stools; loss of appetite; nausea; right upper belly pain; unusually weak or tired; yellowing of the eyes or skin  signs and symptoms of low red blood cells or anemia such as unusually weak or tired; feeling faint or lightheaded; falls  signs and symptoms of muscle injury like dark urine; trouble passing urine or change in the amount of urine; unusually weak or tired; muscle pain; back pain Side effects that usually do not require medical attention (report to your doctor or health care professional if they continue or are bothersome):  changes in taste  diarrhea  gas  hair loss  loss of appetite  mouth sores This list may not describe all possible side effects. Call your doctor for medical advice about side effects. You may report side effects to FDA at 1-800-FDA-1088. Where should I keep my medicine? This drug is given in a hospital or clinic and will not be stored at home. NOTE:   This sheet is a summary. It may not cover all possible information. If you have questions about this medicine, talk to your doctor, pharmacist, or health care provider.  2021 Elsevier/Gold Standard (2018-11-04 12:20:35) Fluorouracil, 5-FU injection What is this medicine? FLUOROURACIL, 5-FU (flure oh YOOR a sil) is a chemotherapy drug. It slows the growth of cancer cells. This medicine is used to treat many types of cancer like breast cancer, colon or rectal cancer, pancreatic cancer, and stomach cancer. This medicine may be used for other purposes; ask your health care provider or  pharmacist if you have questions. COMMON BRAND NAME(S): Adrucil What should I tell my health care provider before I take this medicine? They need to know if you have any of these conditions:  blood disorders  dihydropyrimidine dehydrogenase (DPD) deficiency  infection (especially a virus infection such as chickenpox, cold sores, or herpes)  kidney disease  liver disease  malnourished, poor nutrition  recent or ongoing radiation therapy  an unusual or allergic reaction to fluorouracil, other chemotherapy, other medicines, foods, dyes, or preservatives  pregnant or trying to get pregnant  breast-feeding How should I use this medicine? This drug is given as an infusion or injection into a vein. It is administered in a hospital or clinic by a specially trained health care professional. Talk to your pediatrician regarding the use of this medicine in children. Special care may be needed. Overdosage: If you think you have taken too much of this medicine contact a poison control center or emergency room at once. NOTE: This medicine is only for you. Do not share this medicine with others. What if I miss a dose? It is important not to miss your dose. Call your doctor or health care professional if you are unable to keep an appointment. What may interact with this medicine? Do not take this medicine with any of the following medications:  live virus vaccines This medicine may also interact with the following medications:  medicines that treat or prevent blood clots like warfarin, enoxaparin, and dalteparin This list may not describe all possible interactions. Give your health care provider a list of all the medicines, herbs, non-prescription drugs, or dietary supplements you use. Also tell them if you smoke, drink alcohol, or use illegal drugs. Some items may interact with your medicine. What should I watch for while using this medicine? Visit your doctor for checks on your progress.  This drug may make you feel generally unwell. This is not uncommon, as chemotherapy can affect healthy cells as well as cancer cells. Report any side effects. Continue your course of treatment even though you feel ill unless your doctor tells you to stop. In some cases, you may be given additional medicines to help with side effects. Follow all directions for their use. Call your doctor or health care professional for advice if you get a fever, chills or sore throat, or other symptoms of a cold or flu. Do not treat yourself. This drug decreases your body's ability to fight infections. Try to avoid being around people who are sick. This medicine may increase your risk to bruise or bleed. Call your doctor or health care professional if you notice any unusual bleeding. Be careful brushing and flossing your teeth or using a toothpick because you may get an infection or bleed more easily. If you have any dental work done, tell your dentist you are receiving this medicine. Avoid taking products that contain aspirin, acetaminophen, ibuprofen, naproxen, or ketoprofen unless instructed by your doctor.  These medicines may hide a fever. Do not become pregnant while taking this medicine. Women should inform their doctor if they wish to become pregnant or think they might be pregnant. There is a potential for serious side effects to an unborn child. Talk to your health care professional or pharmacist for more information. Do not breast-feed an infant while taking this medicine. Men should inform their doctor if they wish to father a child. This medicine may lower sperm counts. Do not treat diarrhea with over the counter products. Contact your doctor if you have diarrhea that lasts more than 2 days or if it is severe and watery. This medicine can make you more sensitive to the sun. Keep out of the sun. If you cannot avoid being in the sun, wear protective clothing and use sunscreen. Do not use sun lamps or tanning  beds/booths. What side effects may I notice from receiving this medicine? Side effects that you should report to your doctor or health care professional as soon as possible:  allergic reactions like skin rash, itching or hives, swelling of the face, lips, or tongue  low blood counts - this medicine may decrease the number of white blood cells, red blood cells and platelets. You may be at increased risk for infections and bleeding.  signs of infection - fever or chills, cough, sore throat, pain or difficulty passing urine  signs of decreased platelets or bleeding - bruising, pinpoint red spots on the skin, black, tarry stools, blood in the urine  signs of decreased red blood cells - unusually weak or tired, fainting spells, lightheadedness  breathing problems  changes in vision  chest pain  mouth sores  nausea and vomiting  pain, swelling, redness at site where injected  pain, tingling, numbness in the hands or feet  redness, swelling, or sores on hands or feet  stomach pain  unusual bleeding Side effects that usually do not require medical attention (report to your doctor or health care professional if they continue or are bothersome):  changes in finger or toe nails  diarrhea  dry or itchy skin  hair loss  headache  loss of appetite  sensitivity of eyes to the light  stomach upset  unusually teary eyes This list may not describe all possible side effects. Call your doctor for medical advice about side effects. You may report side effects to FDA at 1-800-FDA-1088. Where should I keep my medicine? This drug is given in a hospital or clinic and will not be stored at home. NOTE: This sheet is a summary. It may not cover all possible information. If you have questions about this medicine, talk to your doctor, pharmacist, or health care provider.  2021 Elsevier/Gold Standard (2019-05-18 15:00:03) Leucovorin injection What is this medicine? LEUCOVORIN (loo koe  VOR in) is used to prevent or treat the harmful effects of some medicines. This medicine is used to treat anemia caused by a low amount of folic acid in the body. It is also used with 5-fluorouracil (5-FU) to treat colon cancer. This medicine may be used for other purposes; ask your health care provider or pharmacist if you have questions. What should I tell my health care provider before I take this medicine? They need to know if you have any of these conditions:  anemia from low levels of vitamin B-12 in the blood  an unusual or allergic reaction to leucovorin, folic acid, other medicines, foods, dyes, or preservatives  pregnant or trying to get pregnant  breast-feeding How  should I use this medicine? This medicine is for injection into a muscle or into a vein. It is given by a health care professional in a hospital or clinic setting. Talk to your pediatrician regarding the use of this medicine in children. Special care may be needed. Overdosage: If you think you have taken too much of this medicine contact a poison control center or emergency room at once. NOTE: This medicine is only for you. Do not share this medicine with others. What if I miss a dose? This does not apply. What may interact with this medicine?  capecitabine  fluorouracil  phenobarbital  phenytoin  primidone  trimethoprim-sulfamethoxazole This list may not describe all possible interactions. Give your health care provider a list of all the medicines, herbs, non-prescription drugs, or dietary supplements you use. Also tell them if you smoke, drink alcohol, or use illegal drugs. Some items may interact with your medicine. What should I watch for while using this medicine? Your condition will be monitored carefully while you are receiving this medicine. This medicine may increase the side effects of 5-fluorouracil, 5-FU. Tell your doctor or health care professional if you have diarrhea or mouth sores that do not  get better or that get worse. What side effects may I notice from receiving this medicine? Side effects that you should report to your doctor or health care professional as soon as possible:  allergic reactions like skin rash, itching or hives, swelling of the face, lips, or tongue  breathing problems  fever, infection  mouth sores  unusual bleeding or bruising  unusually weak or tired Side effects that usually do not require medical attention (report to your doctor or health care professional if they continue or are bothersome):  constipation or diarrhea  loss of appetite  nausea, vomiting This list may not describe all possible side effects. Call your doctor for medical advice about side effects. You may report side effects to FDA at 1-800-FDA-1088. Where should I keep my medicine? This drug is given in a hospital or clinic and will not be stored at home. NOTE: This sheet is a summary. It may not cover all possible information. If you have questions about this medicine, talk to your doctor, pharmacist, or health care provider.  2021 Elsevier/Gold Standard (2007-12-22 16:50:29)

## 2020-09-26 ENCOUNTER — Other Ambulatory Visit (HOSPITAL_COMMUNITY): Payer: Self-pay

## 2020-09-26 ENCOUNTER — Encounter: Payer: Self-pay | Admitting: Physical Therapy

## 2020-09-26 ENCOUNTER — Inpatient Hospital Stay: Payer: Self-pay | Admitting: Nurse Practitioner

## 2020-09-26 ENCOUNTER — Inpatient Hospital Stay: Payer: Self-pay

## 2020-09-27 ENCOUNTER — Telehealth: Payer: Self-pay | Admitting: *Deleted

## 2020-09-27 ENCOUNTER — Other Ambulatory Visit: Payer: Self-pay | Admitting: Student

## 2020-09-27 NOTE — Progress Notes (Signed)
Patient on schedule for Port placement 09/29/2020, spoke with patient;s wife on phone with pre procedure instructions given,made aware to be here @ 0730, NPO after MN prior to procedure as well as driver post procedure/recovery/discharge. Stated understanding.

## 2020-09-27 NOTE — Telephone Encounter (Signed)
Wife called in to know how long things will take , how often he will get tx. And how many he will get. I went over the info and then she said they have appt with palliative care on 3/31 and I see that the appts were cancelled. I looked and let her know that we added the palliative care visit for 4/5 when the pt. Comes for his first treatment. They know about the phone call with [facility] 3/31 2:45. Then port placement 4/1- they know all about instructions for that.

## 2020-09-28 ENCOUNTER — Encounter: Payer: Self-pay | Admitting: Hospice and Palliative Medicine

## 2020-09-28 ENCOUNTER — Ambulatory Visit: Payer: Self-pay | Admitting: Oncology

## 2020-09-28 ENCOUNTER — Inpatient Hospital Stay: Payer: Self-pay

## 2020-09-28 ENCOUNTER — Other Ambulatory Visit: Payer: Self-pay

## 2020-09-28 NOTE — Progress Notes (Signed)
Nutrition Follow-up:  Patient with rectal cancer.  Patient has completed radiation.  Has been seen by second surgeon at [facility] and planning to start neoadjuvant folfox.    Spoke with patient via phone for follow-up.  Patient reports that his appetite is good and has been eating everything wife puts in front of him. Breakfast is usually cereal. Lunch soup or sandwich.  Dinner last night was pork chop and fried potatoes. Reports less nausea.  Lactulose relieved constipation, causing liquid stool.  Reports balancing pain and taking medications.     Medications: reviewed  Labs: reviewed  Anthropometrics:   Weight 172 lb on 3/24  175 lb on 3/3 170 lb on 2/15   NUTRITION DIAGNOSIS: Inadequate oral intake stable   INTERVENTION:  Patient to continue to eat high calorie high protein foods that do not cause GI distress     MONITORING, EVALUATION, GOAL: weight trends, intake   NEXT VISIT: to be determined with treatment  Brandonlee Navis B. Zenia Resides, [place], [place] Registered Dietitian [phone] (mobile)

## 2020-09-28 NOTE — Progress Notes (Signed)
Malik Lynch  Telephone:(336) [phone] Fax:(336) [phone]  ID: Malik Lynch OB: March 20, 1971  MR#: [id number]  NIO#:[id number]  Patient Care Team: Leonard Downing, MD as PCP - General (Family Medicine) [name], RN as Oncology Nurse Navigator  CHIEF COMPLAINT: Stage IIIb rectal cancer.  INTERVAL HISTORY: Patient returns to clinic today for further evaluation and consideration of cycle 1 of 8 of neoadjuvant FOLFOX.  He continues to have significant pain as well as chronic issues with his bladder and urination which require self catheterization.  His diarrhea has improved.  He has chronic weakness and fatigue.  He denies any melena or hematochezia.  He has no neurologic complaints. He denies any recent fevers or illnesses. He has no chest pain, shortness of breath, cough, or hemoptysis.  He denies any nausea or vomiting.  Patient offers no further specific complaints today.  REVIEW OF SYSTEMS:   Review of Systems  Constitutional: Positive for malaise/fatigue. Negative for fever and weight loss.  Respiratory: Negative.  Negative for cough, hemoptysis and shortness of breath.   Cardiovascular: Negative.  Negative for chest pain and leg swelling.  Gastrointestinal: Positive for abdominal pain and diarrhea. Negative for blood in stool and melena.  Genitourinary: Positive for urgency.  Musculoskeletal: Negative.  Negative for back pain.  Skin: Negative.  Negative for rash.  Neurological: Positive for weakness. Negative for dizziness, focal weakness and headaches.  Psychiatric/Behavioral: Negative.  The patient is not nervous/anxious.     As per HPI. Otherwise, a complete review of systems is negative.  PAST MEDICAL HISTORY: Past Medical History:  Diagnosis Date  . Family history of breast cancer   . Family history of colon cancer   . Family history of kidney cancer   . Family history of prostate cancer   . Family history of uterine cancer     PAST  SURGICAL HISTORY: Past Surgical History:  Procedure Laterality Date  . IR IMAGING GUIDED PORT INSERTION  09/29/2020    FAMILY HISTORY: Family History  Problem Relation Age of Onset  . Throat cancer Mother 48  . Lymphoma Maternal Aunt   . Kidney cancer Maternal Uncle   . Breast cancer Paternal Aunt   . Colon cancer Paternal Uncle   . Lung cancer Maternal Grandmother   . Cancer Maternal Grandmother        mouth  . Prostate cancer Maternal Grandfather        dx 66s  . Colon cancer Maternal Grandfather        dx 29s  . Lung cancer Maternal Uncle   . Cancer Maternal Uncle        possibly kidney  . Uterine cancer Paternal Aunt   . Cancer Paternal Aunt        reproductive  . Kidney cancer Paternal Uncle   . Breast cancer Cousin   . Breast cancer Cousin   . Colon cancer Cousin        dx 87s    ADVANCED DIRECTIVES (Y/N):  N  HEALTH MAINTENANCE: Social History   Tobacco Use  . Smoking status: Current Some Day Smoker    Packs/day: 1.00    Types: Cigarettes  . Smokeless tobacco: Never Used  Vaping Use  . Vaping Use: Never used  Substance Use Topics  . Alcohol use: Yes    Alcohol/week: 1.0 standard drink    Types: 1 Cans of beer per week  . Drug use: No     Colonoscopy:  PAP:  Bone density:  Lipid panel:  No Known Allergies  Current Outpatient Medications  Medication Sig Dispense Refill  . ALPRAZolam (XANAX) 0.5 MG tablet Take 1 tablet (0.5 mg total) by mouth 2 (two) times daily as needed for anxiety. 60 tablet 0  . cyclobenzaprine (FLEXERIL) 10 MG tablet Take 1 tablet (10 mg total) by mouth 3 (three) times daily as needed for muscle spasms. 60 tablet 0  . ibuprofen (ADVIL,MOTRIN) 100 MG tablet Take 800 mg by mouth every 6 (six) hours as needed.    . lactulose (CHRONULAC) 10 GM/15ML solution Take 15-30 mLs (10-20 g total) by mouth 2 (two) times daily as needed for severe constipation. 236 mL 0  . lidocaine-prilocaine (EMLA) cream Apply to affected area once 30 g 3   . loperamide (IMODIUM A-D) 2 MG tablet Take 2 mg by mouth 4 (four) times daily as needed for diarrhea or loose stools.    . ondansetron (ZOFRAN) 8 MG tablet Take 1 tablet (8 mg total) by mouth every 8 (eight) hours as needed for nausea or vomiting. 45 tablet 0  . oxyCODONE-acetaminophen (PERCOCET/ROXICET) 5-325 MG tablet Take 1-2 tablets by mouth every 4 (four) hours as needed for severe pain. 90 tablet 0  . polyethylene glycol (MIRALAX / GLYCOLAX) 17 g packet Take 17 g by mouth daily.    . tamsulosin (FLOMAX) 0.4 MG CAPS capsule Take 0.4 mg by mouth daily.    . capecitabine (XELODA) 500 MG tablet Take by mouth 2 (two) times daily after a meal. 1500 mg (3 tabs) twice a day. (Patient not taking: Reported on 10/03/2020)    . oxyCODONE (OXYCONTIN) 15 mg 12 hr tablet Take 1 tablet (15 mg total) by mouth every 8 (eight) hours. 90 tablet 0  . senna (SENOKOT) 8.6 MG TABS tablet Take 1 tablet (8.6 mg total) by mouth daily. (Patient not taking: Reported on 10/03/2020) 120 tablet 3  . Sennosides-Docusate Sodium 8.6-50 MG CAPS Take 1 tablet by mouth as needed for constipation. (Patient not taking: Reported on 10/03/2020)     No current facility-administered medications for this visit.    OBJECTIVE: Vitals:   10/03/20 0917  BP: 110/79  Pulse: (!) 105  Resp: 16  Temp: 98 F (36.7 C)  SpO2: 100%     Body mass index is 21.75 kg/m.    ECOG FS:1 - Symptomatic but completely ambulatory  General: Well-developed, well-nourished, no acute distress. Eyes: Pink conjunctiva, anicteric sclera. HEENT: Normocephalic, moist mucous membranes. Lungs: No audible wheezing or coughing. Heart: Regular rate and rhythm. Abdomen: Soft, nontender, no obvious distention. Musculoskeletal: No edema, cyanosis, or clubbing. Neuro: Alert, answering all questions appropriately. Cranial nerves grossly intact. Skin: No rashes or petechiae noted. Psych: Normal affect. Gross LAB RESULTS:  Lab Results  Component Value Date   NA  134 (L) 10/03/2020   K 3.6 10/03/2020   CL 98 10/03/2020   CO2 27 10/03/2020   GLUCOSE 98 10/03/2020   BUN 10 10/03/2020   CREATININE 0.88 10/03/2020   CALCIUM 9.2 10/03/2020   PROT 7.8 10/03/2020   ALBUMIN 3.7 10/03/2020   AST 17 10/03/2020   ALT 20 10/03/2020   ALKPHOS 94 10/03/2020   BILITOT 0.3 10/03/2020   GFRNONAA >60 10/03/2020    Lab Results  Component Value Date   WBC 7.2 10/03/2020   NEUTROABS 5.6 10/03/2020   HGB 12.9 (L) 10/03/2020   HCT 37.5 (L) 10/03/2020   MCV 100.5 (H) 10/03/2020   PLT 377 10/03/2020     STUDIES: IR IMAGING GUIDED  PORT INSERTION  Result Date: 09/29/2020 CLINICAL DATA:  History of rectal carcinoma and need for porta cath for continued chemotherapy. EXAM: IMPLANTED PORT A CATH PLACEMENT WITH ULTRASOUND AND FLUOROSCOPIC GUIDANCE ANESTHESIA/SEDATION: 2.0 mg IV Versed; 100 mcg IV Fentanyl Total Moderate Sedation Time:  32 minutes The patient's level of consciousness and physiologic status were continuously monitored during the procedure by Radiology nursing. FLUOROSCOPY TIME:  18 seconds.  2.0 mGy. PROCEDURE: The procedure, risks, benefits, and alternatives were explained to the patient. Questions regarding the procedure were encouraged and answered. The patient understands and consents to the procedure. A time-out was performed prior to initiating the procedure. Ultrasound was utilized to confirm patency of the right internal jugular vein. The right neck and chest were prepped with chlorhexidine in a sterile fashion, and a sterile drape was applied covering the operative field. Maximum barrier sterile technique with sterile gowns and gloves were used for the procedure. Local anesthesia was provided with 1% lidocaine. After creating a small venotomy incision, a 21 gauge needle was advanced into the right internal jugular vein under direct, real-time ultrasound guidance. Ultrasound image documentation was performed. After securing guidewire access, an 8 Fr  dilator was placed. A J-wire was kinked to measure appropriate catheter length. A subcutaneous port pocket was then created along the upper chest wall utilizing sharp and blunt dissection. Portable cautery was utilized. The pocket was irrigated with sterile saline. A single lumen power injectable port was chosen for placement. The 8 Fr catheter was tunneled from the port pocket site to the venotomy incision. The port was placed in the pocket. External catheter was trimmed to appropriate length based on guidewire measurement. At the venotomy, an 8 Fr peel-away sheath was placed over a guidewire. The catheter was then placed through the sheath and the sheath removed. Final catheter positioning was confirmed and documented with a fluoroscopic spot image. The port was accessed with a needle and aspirated and flushed with heparinized saline. The access needle was removed. The venotomy and port pocket incisions were closed with subcutaneous 3-0 Vicryl and subcuticular 4-0 Monocryl. Dermabond was applied to both incisions. COMPLICATIONS: COMPLICATIONS None FINDINGS: After catheter placement, the tip lies at the cavo-atrial junction. The catheter aspirates normally and is ready for immediate use. IMPRESSION: Placement of single lumen port a cath via right internal jugular vein. The catheter tip lies at the cavo-atrial junction. A power injectable port a cath was placed and is ready for immediate use. Electronically Signed   By: Aletta Edouard M.D.   On: 09/29/2020 09:54    ASSESSMENT: Stage IIIb rectal cancer.  PLAN:    1.  Stage IIIb rectal cancer: Pathology and imaging results reviewed independently.  EUS completed at outside facility confirmed stage of disease.  PET scan results from June 28, 2020 reviewed independently with no obvious malignancy outside of patient's known rectal mass.  Patient completed his neoadjuvant treatment with chemotherapy and XRT on August 17, 2020.  His recent appointment with [facility]  GI surgery recommended continued neoadjuvant treatment with FOLFOX prior to surgical resection.  Patient does not wish to use capecitabine any further.  Proceed with cycle 1 of 8 of neoadjuvant FOLFOX today.  Return to clinic in 2 days for pump removal, 1 week for laboratory work and evaluation and then in 2 weeks for further evaluation and consideration of cycle 2.   2.  Weight loss: Improved.  Appreciate dietary input. 3.  Pain: Worse.  Increase OxyContin to 15 mg every 8 hours and  continue Percocet every 4 hours as needed.  Appreciate palliative care input.   4.  Left kidney mass: 2.8 x 3.6 x 3.4 mass highly concerning for a second malignancy.  Patient will likely have to undergo partial nephrectomy at the time of his rectal surgery.  He has had consultation with urology at [facility]. 5.  Urinary retention: [place] urology input.  Patient is now doing self-catheterization. 6.  Diarrhea: Essentially chronic and unchanged.  Continue Imodium and Lomotil as prescribed. 7.  Hypokalemia: Resolved. 8.  Anxiety: Chronic and unchanged.  Appreciate palliative care input.  Patient expressed understanding and was in agreement with this plan. He also understands that He can call clinic at any time with any questions, concerns, or complaints.   Cancer Staging Rectal cancer [facility]) Staging form: Colon and Rectum, AJCC 8th Edition - Clinical stage from 06/22/2020: Stage IIIB (cT3, cN1a, cM0) - Signed by Lloyd Huger, MD on 06/22/2020 Stage prefix: Initial diagnosis Total positive nodes: 1   Lloyd Huger, MD   10/04/2020 6:36 AM

## 2020-09-28 NOTE — H&P (Signed)
Chief Complaint: Patient was seen in consultation today for port-a-catheter placement  Referring Physician(s): Lloyd Huger  Supervising Physician: Aletta Edouard  Patient Status: [facility] - Out-pt  History of Present Illness: Malik Lynch is a 50 y.o. male with no significant past medical history. He began having abdominal/pelvic pain along with difficulty urinating. Subsequent work up and biopsy revealed Stage IIIb rectal cancer and he was started on neoadjuvant chemotherapy and radiation prior to possible surgical intervention. The patient requires reliable venous access and Interventional Radiology has been asked to evaluate this patient for an image-guided port-a-catheter placement to facilitate his treatment plans.   Past Medical History:  Diagnosis Date  . Family history of breast cancer   . Family history of colon cancer   . Family history of kidney cancer   . Family history of prostate cancer   . Family history of uterine cancer     No past surgical history on file.  Allergies: Patient has no known allergies.  Medications: Prior to Admission medications   Medication Sig Start Date End Date Taking? Authorizing Provider  ALPRAZolam Duanne Moron) 0.5 MG tablet Take 1 tablet (0.5 mg total) by mouth 2 (two) times daily as needed for anxiety. 08/11/20   Borders, Kirt Boys, NP  capecitabine (XELODA) 500 MG tablet Take by mouth 2 (two) times daily after a meal. 1500 mg (3 tabs) twice a day.    [provider]  cyclobenzaprine (FLEXERIL) 10 MG tablet Take 1 tablet (10 mg total) by mouth 3 (three) times daily as needed for muscle spasms. 09/13/20   Borders, Kirt Boys, NP  ibuprofen (ADVIL,MOTRIN) 100 MG tablet Take 800 mg by mouth every 6 (six) hours as needed.    [provider]  lactulose (CHRONULAC) 10 GM/15ML solution Take 15-30 mLs (10-20 g total) by mouth 2 (two) times daily as needed for severe constipation. 09/21/20   Borders, Kirt Boys, NP   lidocaine-prilocaine (EMLA) cream Apply to affected area once 09/21/20   Lloyd Huger, MD  loperamide (IMODIUM A-D) 2 MG tablet Take 2 mg by mouth 4 (four) times daily as needed for diarrhea or loose stools.    [provider]  ondansetron (ZOFRAN) 8 MG tablet Take 1 tablet (8 mg total) by mouth every 8 (eight) hours as needed for nausea or vomiting. 08/10/20   Borders, Kirt Boys, NP  oxyCODONE (OXYCONTIN) 10 mg 12 hr tablet Take 1 tablet (10 mg total) by mouth every 8 (eight) hours. 09/13/20   Borders, Kirt Boys, NP  oxyCODONE-acetaminophen (PERCOCET/ROXICET) 5-325 MG tablet Take 1-2 tablets by mouth every 4 (four) hours as needed for severe pain. 09/13/20   Borders, Kirt Boys, NP  polyethylene glycol (MIRALAX / GLYCOLAX) 17 g packet Take 17 g by mouth daily.    [provider]  senna (SENOKOT) 8.6 MG TABS tablet Take 1 tablet (8.6 mg total) by mouth daily. Patient not taking: No sig reported 07/13/20   Borders, Kirt Boys, NP  Sennosides-Docusate Sodium 8.6-50 MG CAPS Take 1 tablet by mouth as needed for constipation. Patient not taking: No sig reported    [provider]  tamsulosin (FLOMAX) 0.4 MG CAPS capsule Take 0.4 mg by mouth daily. 06/14/20   [provider]  prochlorperazine (COMPAZINE) 10 MG tablet Take 1 tablet (10 mg total) by mouth every 6 (six) hours as needed (Nausea or vomiting). 08/09/20 09/21/20  Darl Pikes, RPH-CPP     Family History  Problem Relation Age of Onset  .  Throat cancer Mother 37  . Lymphoma Maternal Aunt   . Kidney cancer Maternal Uncle   . Breast cancer Paternal Aunt   . Colon cancer Paternal Uncle   . Lung cancer Maternal Grandmother   . Cancer Maternal Grandmother        mouth  . Prostate cancer Maternal Grandfather        dx 10s  . Colon cancer Maternal Grandfather        dx 2s  . Lung cancer Maternal Uncle   . Cancer Maternal Uncle        possibly kidney  . Uterine cancer Paternal Aunt   . Cancer Paternal  Aunt        reproductive  . Kidney cancer Paternal Uncle   . Breast cancer Cousin   . Breast cancer Cousin   . Colon cancer Cousin        dx 11s    Social History   Socioeconomic History  . Marital status: Divorced    Spouse name: Not on file  . Number of children: Not on file  . Years of education: Not on file  . Highest education level: Not on file  Occupational History  . Not on file  Tobacco Use  . Smoking status: Current Some Day Smoker    Packs/day: 1.00    Types: Cigarettes  . Smokeless tobacco: Never Used  Vaping Use  . Vaping Use: Never used  Substance and Sexual Activity  . Alcohol use: Yes    Alcohol/week: 1.0 standard drink    Types: 1 Cans of beer per week  . Drug use: No  . Sexual activity: Not Currently  Other Topics Concern  . Not on file  Social History Narrative  . Not on file   Social Determinants of Health   Financial Resource Strain: Not on file  Food Insecurity: Not on file  Transportation Needs: Not on file  Physical Activity: Not on file  Stress: Not on file  Social Connections: Not on file    Review of Systems: A 12 point ROS discussed and pertinent positives are indicated in the HPI above.  All other systems are negative.  Review of Systems  Constitutional: Positive for appetite change and fatigue.  Respiratory: Negative for cough and shortness of breath.   Cardiovascular: Negative for chest pain.  Gastrointestinal: Positive for constipation and rectal pain. Negative for abdominal pain, nausea and vomiting.  Musculoskeletal: Negative for back pain.  Neurological: Negative for dizziness and headaches.    Vital Signs: Pulse 84   Temp 98.2 F (36.8 C) (Oral)   Resp 18   Ht 6\' 2"  (1.88 m)   SpO2 98%   BMI 22.08 kg/m   Physical Exam Constitutional:      General: He is not in acute distress. HENT:     Mouth/Throat:     Mouth: Mucous membranes are moist.     Pharynx: Oropharynx is clear.  Cardiovascular:     Rate and  Rhythm: Normal rate and regular rhythm.     Pulses: Normal pulses.     Heart sounds: Normal heart sounds.  Pulmonary:     Effort: Pulmonary effort is normal.     Breath sounds: Normal breath sounds.  Abdominal:     General: Bowel sounds are normal.     Palpations: Abdomen is soft.     Tenderness: There is no abdominal tenderness.  Musculoskeletal:        General: Normal range of motion.  Cervical back: Normal range of motion.  Skin:    General: Skin is warm and dry.  Neurological:     Mental Status: He is alert and oriented to person, place, and time.     Imaging: No results found.  Labs:  CBC: Recent Labs    08/08/20 0829 08/15/20 0815 08/31/20 0955 09/21/20 0929  WBC 6.1 7.2 7.0 7.8  HGB 14.5 14.9 13.3 13.1  HCT 40.3 41.4 39.3 37.6*  PLT 244 269 261 332    COAGS: No results for input(s): INR, APTT in the last 8760 hours.  BMP: Recent Labs    08/08/20 0829 08/15/20 0815 08/31/20 0955 09/21/20 0929  NA 138 136 137 133*  K 3.6 3.1* 4.3 3.8  CL 100 98 101 99  CO2 27 28 26 23   GLUCOSE 81 81 79 98  BUN 11 8 10 11   CALCIUM 9.7 9.4 9.1 9.1  CREATININE 1.16 0.88 0.91 0.87  GFRNONAA >60 >60 >60 >60    LIVER FUNCTION TESTS: Recent Labs    08/08/20 0829 08/15/20 0815 08/31/20 0955 09/21/20 0929  BILITOT 0.5 0.5 0.5 0.4  AST 28 26 14* 16  ALT 37 47* 13 15  ALKPHOS 80 70 89 92  PROT 7.8 7.7 7.0 7.4  ALBUMIN 4.2 4.1 3.9 3.9    TUMOR MARKERS: No results for input(s): AFPTM, CEA, CA199, CHROMGRNA in the last 8760 hours.  Assessment and Plan:  Rectal cancer: Malik Lynch, 50 year old male, presents today to the [facility] Interventional Radiology department for an image-guided port-a-catheter placement.   Risks and benefits of image-guided port-a-catheter placement were discussed with the patient including, but not limited to bleeding, infection, pneumothorax, or fibrin sheath development and need for additional  procedures.  All of the patient's questions were answered, patient is agreeable to proceed. He has been NPO. Vitals have been reviewed.   Consent signed and in chart.  Thank you for this interesting consult.  I greatly enjoyed meeting Malik Lynch and look forward to participating in their care.  A copy of this report was sent to the requesting provider on this date.  Electronically Signed: Soyla Dryer, AGACNP-BC [phone] 09/29/2020, 8:02 AM   I spent a total of  30 Minutes   in face to face in clinical consultation, greater than 50% of which was counseling/coordinating care for port-a-catheter placement.

## 2020-09-29 ENCOUNTER — Other Ambulatory Visit: Payer: Self-pay

## 2020-09-29 ENCOUNTER — Ambulatory Visit
Admission: RE | Admit: 2020-09-29 | Discharge: 2020-09-29 | Disposition: A | Discharge status: Home / Self Care | Payer: Self-pay | Source: Ambulatory Visit | Attending: Oncology | Admitting: Oncology

## 2020-09-29 DIAGNOSIS — C20 Malignant neoplasm of rectum: Secondary | ICD-10-CM | POA: Insufficient documentation

## 2020-09-29 DIAGNOSIS — F17210 Nicotine dependence, cigarettes, uncomplicated: Secondary | ICD-10-CM | POA: Insufficient documentation

## 2020-09-29 HISTORY — PX: IR IMAGING GUIDED PORT INSERTION: IMG5740

## 2020-09-29 MED ORDER — MIDAZOLAM HCL 2 MG/2ML IJ SOLN
INTRAMUSCULAR | Status: AC
  Filled 2020-09-29: qty 2

## 2020-09-29 MED ORDER — SODIUM CHLORIDE 0.9 % IV SOLN: INTRAVENOUS | Status: DC

## 2020-09-29 MED ORDER — FENTANYL CITRATE (PF) 100 MCG/2ML IJ SOLN
INTRAMUSCULAR | Status: AC
  Filled 2020-09-29: qty 2

## 2020-09-29 MED ORDER — MIDAZOLAM HCL 2 MG/2ML IJ SOLN
INTRAMUSCULAR | Status: AC | PRN
  Administered 2020-09-29 (×2): 1 mg via INTRAVENOUS

## 2020-09-29 MED ORDER — FENTANYL CITRATE (PF) 100 MCG/2ML IJ SOLN
INTRAMUSCULAR | Status: AC | PRN
  Administered 2020-09-29 (×2): 50 ug via INTRAVENOUS

## 2020-09-29 NOTE — Discharge Instructions (Signed)
Implanted Port Insertion, Care After This sheet gives you information about how to care for yourself after your procedure. Your health care provider may also give you more specific instructions. If you have problems or questions, contact your health care provider. What can I expect after the procedure? After the procedure, it is common to have:  Discomfort at the port insertion site.  Bruising on the skin over the port. This should improve over 3-4 days. Follow these instructions at home: Port care  After your port is placed, you will get a manufacturer's information card. The card has information about your port. Keep this card with you at all times.  Take care of the port as told by your health care provider. Ask your health care provider if you or a family member can get training for taking care of the port at home. A home health care nurse may also take care of the port.  Make sure to remember what type of port you have. Incision care  Follow instructions from your health care provider about how to take care of your port insertion site. Make sure you: ? Wash your hands with soap and water before and after you change your bandage (dressing). If soap and water are not available, use hand sanitizer. ? Change your dressing as told by your health care provider. ? Leave stitches (sutures), skin glue, or adhesive strips in place. These skin closures may need to stay in place for 2 weeks or longer. If adhesive strip edges start to loosen and curl up, you may trim the loose edges. Do not remove adhesive strips completely unless your health care provider tells you to do that.  Check your port insertion site every day for signs of infection. Check for: ? Redness, swelling, or pain. ? Fluid or blood. ? Warmth. ? Pus or a bad smell.      Activity  Return to your normal activities as told by your health care provider. Ask your health care provider what activities are safe for you.  Do not  lift anything that is heavier than 10 lb (4.5 kg), or the limit that you are told, until your health care provider says that it is safe. General instructions  Take over-the-counter and prescription medicines only as told by your health care provider.  Do not take baths, swim, or use a hot tub until your health care provider approves. Ask your health care provider if you may take showers. You may only be allowed to take sponge baths.  Do not drive for 24 hours if you were given a sedative during your procedure.  Wear a medical alert bracelet in case of an emergency. This will tell any health care providers that you have a port.  Keep all follow-up visits as told by your health care provider. This is important. Contact a health care provider if:  You cannot flush your port with saline as directed, or you cannot draw blood from the port.  You have a fever or chills.  You have redness, swelling, or pain around your port insertion site.  You have fluid or blood coming from your port insertion site.  Your port insertion site feels warm to the touch.  You have pus or a bad smell coming from the port insertion site. Get help right away if:  You have chest pain or shortness of breath.  You have bleeding from your port that you cannot control. Summary  Take care of the port as told by your   health care provider. Keep the manufacturer's information card with you at all times.  Change your dressing as told by your health care provider.  Contact a health care provider if you have a fever or chills or if you have redness, swelling, or pain around your port insertion site.  Keep all follow-up visits as told by your health care provider. This information is not intended to replace advice given to you by your health care provider. Make sure you discuss any questions you have with your health care provider. Document Revised: 01/13/2018 Document Reviewed: 01/13/2018 Elsevier Patient Education   2021 Elsevier Inc.  

## 2020-09-29 NOTE — Progress Notes (Signed)
Patient clinically stable post Port placement per DR Kathlene Cote, tolerated well. Awake/alert and oriented post procedure. Denies complaints at this time. Received Versed 2 mg along with Fentanyl 100 mcg IV for procedure. Report given to [facility] in specials post procedure.

## 2020-09-29 NOTE — Procedures (Signed)
Interventional Radiology Procedure Note  Procedure: Single Lumen Power Port Placement    Access:  Right IJ vein.  Findings: Catheter tip positioned at SVC/RA junction. Port is ready for immediate use.   Complications: None  EBL: < 10 mL  Recommendations:  - Ok to shower in 24 hours - Do not submerge for 7 days - Routine line care   Dally Oshel T. Canda Podgorski, M.D Pager:  [phone]   

## 2020-10-02 ENCOUNTER — Other Ambulatory Visit: Payer: Self-pay | Admitting: *Deleted

## 2020-10-02 MED ORDER — LACTULOSE 10 GM/15ML PO SOLN
10.0000 g | Freq: Two times a day (BID) | ORAL | 0 refills | Status: DC | PRN
Start: 2020-10-02 — End: 2020-10-24

## 2020-10-03 ENCOUNTER — Inpatient Hospital Stay: Payer: Self-pay | Attending: Oncology

## 2020-10-03 ENCOUNTER — Inpatient Hospital Stay (HOSPITAL_BASED_OUTPATIENT_CLINIC_OR_DEPARTMENT_OTHER): Payer: Self-pay | Admitting: Oncology

## 2020-10-03 ENCOUNTER — Encounter: Payer: Self-pay | Admitting: Oncology

## 2020-10-03 ENCOUNTER — Inpatient Hospital Stay (HOSPITAL_BASED_OUTPATIENT_CLINIC_OR_DEPARTMENT_OTHER): Payer: Self-pay | Admitting: Nurse Practitioner

## 2020-10-03 ENCOUNTER — Inpatient Hospital Stay (HOSPITAL_BASED_OUTPATIENT_CLINIC_OR_DEPARTMENT_OTHER): Payer: Self-pay | Admitting: Hospice and Palliative Medicine

## 2020-10-03 ENCOUNTER — Inpatient Hospital Stay: Payer: Self-pay

## 2020-10-03 VITALS — BP 110/79 | HR 105 | Temp 98.0°F | Resp 16 | Wt 169.4 lb

## 2020-10-03 DIAGNOSIS — Z5111 Encounter for antineoplastic chemotherapy: Secondary | ICD-10-CM | POA: Insufficient documentation

## 2020-10-03 DIAGNOSIS — R21 Rash and other nonspecific skin eruption: Secondary | ICD-10-CM | POA: Insufficient documentation

## 2020-10-03 DIAGNOSIS — C20 Malignant neoplasm of rectum: Secondary | ICD-10-CM

## 2020-10-03 DIAGNOSIS — Z803 Family history of malignant neoplasm of breast: Secondary | ICD-10-CM | POA: Insufficient documentation

## 2020-10-03 DIAGNOSIS — F419 Anxiety disorder, unspecified: Secondary | ICD-10-CM | POA: Insufficient documentation

## 2020-10-03 DIAGNOSIS — Z515 Encounter for palliative care: Secondary | ICD-10-CM

## 2020-10-03 DIAGNOSIS — G893 Neoplasm related pain (acute) (chronic): Secondary | ICD-10-CM

## 2020-10-03 DIAGNOSIS — R634 Abnormal weight loss: Secondary | ICD-10-CM | POA: Insufficient documentation

## 2020-10-03 DIAGNOSIS — Z800 Family history of malignant neoplasm of digestive organs: Secondary | ICD-10-CM | POA: Insufficient documentation

## 2020-10-03 DIAGNOSIS — F17210 Nicotine dependence, cigarettes, uncomplicated: Secondary | ICD-10-CM | POA: Insufficient documentation

## 2020-10-03 DIAGNOSIS — R52 Pain, unspecified: Secondary | ICD-10-CM | POA: Insufficient documentation

## 2020-10-03 DIAGNOSIS — Z8051 Family history of malignant neoplasm of kidney: Secondary | ICD-10-CM | POA: Insufficient documentation

## 2020-10-03 DIAGNOSIS — Z8042 Family history of malignant neoplasm of prostate: Secondary | ICD-10-CM | POA: Insufficient documentation

## 2020-10-03 LAB — CBC WITH DIFFERENTIAL/PLATELET
Abs Immature Granulocytes: 0.03 10*3/uL (ref 0.00–0.07)
Basophils Absolute: 0 10*3/uL (ref 0.0–0.1)
Basophils Relative: 0 %
Eosinophils Absolute: 0.2 10*3/uL (ref 0.0–0.5)
Eosinophils Relative: 3 %
HCT: 37.5 % — ABNORMAL LOW (ref 39.0–52.0)
Hemoglobin: 12.9 g/dL — ABNORMAL LOW (ref 13.0–17.0)
Immature Granulocytes: 0 %
Lymphocytes Relative: 11 %
Lymphs Abs: 0.8 10*3/uL (ref 0.7–4.0)
MCH: 34.6 pg — ABNORMAL HIGH (ref 26.0–34.0)
MCHC: 34.4 g/dL (ref 30.0–36.0)
MCV: 100.5 fL — ABNORMAL HIGH (ref 80.0–100.0)
Monocytes Absolute: 0.5 10*3/uL (ref 0.1–1.0)
Monocytes Relative: 7 %
Neutro Abs: 5.6 10*3/uL (ref 1.7–7.7)
Neutrophils Relative %: 79 %
Platelets: 377 10*3/uL (ref 150–400)
RBC: 3.73 MIL/uL — ABNORMAL LOW (ref 4.22–5.81)
RDW: 13.9 % (ref 11.5–15.5)
WBC: 7.2 10*3/uL (ref 4.0–10.5)
nRBC: 0 % (ref 0.0–0.2)

## 2020-10-03 LAB — COMPREHENSIVE METABOLIC PANEL
ALT: 20 U/L (ref 0–44)
AST: 17 U/L (ref 15–41)
Albumin: 3.7 g/dL (ref 3.5–5.0)
Alkaline Phosphatase: 94 U/L (ref 38–126)
Anion gap: 9 (ref 5–15)
BUN: 10 mg/dL (ref 6–20)
CO2: 27 mmol/L (ref 22–32)
Calcium: 9.2 mg/dL (ref 8.9–10.3)
Chloride: 98 mmol/L (ref 98–111)
Creatinine, Ser: 0.88 mg/dL (ref 0.61–1.24)
GFR, Estimated: >60 mL/min (ref >60)
Glucose, Bld: 98 mg/dL (ref 70–99)
Potassium: 3.6 mmol/L (ref 3.5–5.1)
Sodium: 134 mmol/L — ABNORMAL LOW (ref 135–145)
Total Bilirubin: 0.3 mg/dL (ref 0.3–1.2)
Total Protein: 7.8 g/dL (ref 6.5–8.1)

## 2020-10-03 LAB — PHOSPHORUS: Phosphorus: 3.8 mg/dL (ref 2.5–4.6)

## 2020-10-03 LAB — MAGNESIUM: Magnesium: 2 mg/dL (ref 1.7–2.4)

## 2020-10-03 MED ORDER — OXYCODONE HCL ER 15 MG PO T12A
15.0000 mg | EXTENDED_RELEASE_TABLET | Freq: Three times a day (TID) | ORAL | 0 refills | Status: DC
Start: 2020-10-03 — End: 2020-10-24

## 2020-10-03 MED ORDER — OXALIPLATIN CHEMO INJECTION 100 MG/20ML
85.0000 mg/m2 | Freq: Once | INTRAVENOUS | Status: AC
  Administered 2020-10-03: 170 mg via INTRAVENOUS
  Filled 2020-10-03: qty 34

## 2020-10-03 MED ORDER — PALONOSETRON HCL INJECTION 0.25 MG/5ML
0.2500 mg | Freq: Once | INTRAVENOUS | Status: AC
  Administered 2020-10-03: 0.25 mg via INTRAVENOUS
  Filled 2020-10-03: qty 5

## 2020-10-03 MED ORDER — DEXTROSE 5 % IV SOLN
Freq: Once | INTRAVENOUS | Status: AC
  Filled 2020-10-03: qty 250

## 2020-10-03 MED ORDER — SODIUM CHLORIDE 0.9 % IV SOLN
2400.0000 mg/m2 | INTRAVENOUS | Status: DC
  Administered 2020-10-03: 4800 mg via INTRAVENOUS
  Filled 2020-10-03: qty 96

## 2020-10-03 MED ORDER — DEXTROSE 5 % IV SOLN
800.0000 mg | Freq: Once | INTRAVENOUS | Status: AC
  Administered 2020-10-03: 800 mg via INTRAVENOUS
  Filled 2020-10-03: qty 40

## 2020-10-03 MED ORDER — SODIUM CHLORIDE 0.9 % IV SOLN
10.0000 mg | Freq: Once | INTRAVENOUS | Status: AC
  Administered 2020-10-03: 10 mg via INTRAVENOUS
  Filled 2020-10-03: qty 10

## 2020-10-03 MED ORDER — FLUOROURACIL CHEMO INJECTION 2.5 GM/50ML
400.0000 mg/m2 | Freq: Once | INTRAVENOUS | Status: AC
  Administered 2020-10-03: 800 mg via INTRAVENOUS
  Filled 2020-10-03: qty 16

## 2020-10-03 NOTE — Progress Notes (Signed)
1010: Per Malva Cogan RN per Dr. Grayland Ormond okay to proceed with HR 105.  1330: Pt tolerated infusion well, no s/s of distress or reaction noted. Home infusion pump education performed, spill kit provided, pt verbalizes understanding. Pt stable at discharge.

## 2020-10-03 NOTE — Progress Notes (Signed)
[place]  [phone] [phone]   Name: Malik Lynch Date: 10/03/2020 MRN: [id number]  DOB: 1970-12-17  Patient Care Team: Leonard Downing, MD as PCP - General (Family Medicine) [name], RN as Oncology Nurse Navigator    REASON FOR CONSULTATION: Malik Lynch is a 50 y.o. male with multiple medical problems including stage IIIb rectal cancer on XRT and neoadjuvant chemotherapy with capecitabine.  Patient has had pain, weight loss, and anxiety.  He was referred to palliative care to help address goals and manage ongoing symptoms.  SOCIAL HISTORY:     reports that he has been smoking cigarettes. He has been smoking about 1.00 pack per day. He has never used smokeless tobacco. He reports current alcohol use of about 1.0 standard drink of alcohol per week. He reports that he does not use drugs.  Patient is married and lives at home with his wife.  He has 2 sons who live nearby.  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    ADVANCE DIRECTIVES:  Not on file  CODE STATUS:   PAST MEDICAL HISTORY: Past Medical History:  Diagnosis Date  . Family history of breast cancer   . Family history of colon cancer   . Family history of kidney cancer   . Family history of prostate cancer   . Family history of uterine cancer     PAST SURGICAL HISTORY:  Past Surgical History:  Procedure Laterality Date  . IR IMAGING GUIDED PORT INSERTION  09/29/2020    HEMATOLOGY/ONCOLOGY HISTORY:  Oncology History  Rectal cancer ([place])  06/22/2020 Initial Diagnosis   Rectal cancer ([place])   06/22/2020 Cancer Staging   Staging form: Colon and Rectum, AJCC 8th Edition - Clinical stage from 06/22/2020: Stage IIIB (cT3, cN1a, cM0) - Signed by Lloyd Huger, MD on 06/22/2020   06/22/2020 - 06/22/2020 Chemotherapy          Genetic Testing   Negative genetic testing. No  pathogenic variants identified on the [facility] CancerNext-Expanded+RNA panel. The report date is 08/18/2020.   The CancerNext-Expanded + RNAinsight gene panel offered by [employer] and includes sequencing and rearrangement analysis for the following 77 genes: IP, ALK, APC*, ATM*, AXIN2, BAP1, BARD1, BLM, BMPR1A, BRCA1*, BRCA2*, BRIP1*, CDC73, CDH1*,CDK4, CDKN1B, CDKN2A, CHEK2*, CTNNA1, DICER1, FANCC, FH, FLCN, GALNT12, KIF1B, LZTR1, MAX, MEN1, MET, MLH1*, MSH2*, MSH3, MSH6*, MUTYH*, NBN, NF1*, NF2, NTHL1, PALB2*, PHOX2B, PMS2*, POT1, PRKAR1A, PTCH1, PTEN*, RAD51C*, RAD51D*,RB1, RECQL, RET, SDHA, SDHAF2, SDHB, SDHC, SDHD, SMAD4, SMARCA4, SMARCB1, SMARCE1, STK11, SUFU, TMEM127, TP53*,TSC1, TSC2, VHL and XRCC2 (sequencing and deletion/duplication); EGFR, EGLN1, HOXB13, KIT, MITF, PDGFRA, POLD1 and POLE (sequencing only); EPCAM and GREM1 (deletion/duplication only).    10/03/2020 -  Chemotherapy    Patient is on Treatment Plan: COLORECTAL FOLFOX Q14D X 4 MONTHS        ALLERGIES:  has No Known Allergies.  MEDICATIONS:  Current Outpatient Medications  Medication Sig Dispense Refill  . ALPRAZolam (XANAX) 0.5 MG tablet Take 1 tablet (0.5 mg total) by mouth 2 (two) times daily as needed for anxiety. 60 tablet 0  . capecitabine (XELODA) 500 MG tablet Take by mouth 2 (two) times daily after a meal. 1500 mg (3 tabs) twice a day. (Patient not taking: Reported on 10/03/2020)    . cyclobenzaprine (FLEXERIL) 10 MG tablet Take 1 tablet (10 mg total) by mouth 3 (three) times daily as needed for muscle spasms. 60 tablet  0  . ibuprofen (ADVIL,MOTRIN) 100 MG tablet Take 800 mg by mouth every 6 (six) hours as needed.    . lactulose (CHRONULAC) 10 GM/15ML solution Take 15-30 mLs (10-20 g total) by mouth 2 (two) times daily as needed for severe constipation. 236 mL 0  . lidocaine-prilocaine (EMLA) cream Apply to affected area once 30 g 3  . loperamide (IMODIUM A-D) 2 MG tablet Take 2 mg by mouth 4 (four) times daily as  needed for diarrhea or loose stools.    . ondansetron (ZOFRAN) 8 MG tablet Take 1 tablet (8 mg total) by mouth every 8 (eight) hours as needed for nausea or vomiting. 45 tablet 0  . oxyCODONE (OXYCONTIN) 10 mg 12 hr tablet Take 1 tablet (10 mg total) by mouth every 8 (eight) hours. 90 tablet 0  . oxyCODONE-acetaminophen (PERCOCET/ROXICET) 5-325 MG tablet Take 1-2 tablets by mouth every 4 (four) hours as needed for severe pain. 90 tablet 0  . polyethylene glycol (MIRALAX / GLYCOLAX) 17 g packet Take 17 g by mouth daily.    Marland Kitchen senna (SENOKOT) 8.6 MG TABS tablet Take 1 tablet (8.6 mg total) by mouth daily. (Patient not taking: Reported on 10/03/2020) 120 tablet 3  . Sennosides-Docusate Sodium 8.6-50 MG CAPS Take 1 tablet by mouth as needed for constipation. (Patient not taking: Reported on 10/03/2020)    . tamsulosin (FLOMAX) 0.4 MG CAPS capsule Take 0.4 mg by mouth daily.     No current facility-administered medications for this visit.   Facility-Administered Medications Ordered in Other Visits  Medication Dose Route Frequency Provider Last Rate Last Admin  . fluorouracil (ADRUCIL) 4,800 mg in sodium chloride 0.9 % 54 mL chemo infusion  2,400 mg/m2 (Treatment Plan Recorded) Intravenous 1 day or 1 dose Jeralyn Ruths, MD      . fluorouracil (ADRUCIL) chemo injection 800 mg  400 mg/m2 (Treatment Plan Recorded) Intravenous Once Jeralyn Ruths, MD      . leucovorin 800 mg in dextrose 5 % 250 mL infusion  800 mg Intravenous Once Jeralyn Ruths, MD 145 mL/hr at 10/03/20 1105 800 mg at 10/03/20 1105  . oxaliplatin (ELOXATIN) 170 mg in dextrose 5 % 500 mL chemo infusion  85 mg/m2 (Treatment Plan Recorded) Intravenous Once Jeralyn Ruths, MD 267 mL/hr at 10/03/20 1106 170 mg at 10/03/20 1106    VITAL SIGNS: There were no vitals taken for this visit. There were no vitals filed for this visit.  Estimated body mass index is 21.75 kg/m as calculated from the following:   Height as of 09/29/20:  6\' 2"  (1.88 m).   Weight as of an earlier encounter on 10/03/20: 169 lb 7 oz (76.9 kg).  LABS: CBC:    Component Value Date/Time   WBC 7.2 10/03/2020 0841   HGB 12.9 (L) 10/03/2020 0841   HCT 37.5 (L) 10/03/2020 0841   PLT 377 10/03/2020 0841   MCV 100.5 (H) 10/03/2020 0841   NEUTROABS 5.6 10/03/2020 0841   LYMPHSABS 0.8 10/03/2020 0841   MONOABS 0.5 10/03/2020 0841   EOSABS 0.2 10/03/2020 0841   BASOSABS 0.0 10/03/2020 0841   Comprehensive Metabolic Panel:    Component Value Date/Time   NA 134 (L) 10/03/2020 0841   K 3.6 10/03/2020 0841   CL 98 10/03/2020 0841   CO2 27 10/03/2020 0841   BUN 10 10/03/2020 0841   CREATININE 0.88 10/03/2020 0841   GLUCOSE 98 10/03/2020 0841   CALCIUM 9.2 10/03/2020 0841   AST 17 10/03/2020 0841  ALT 20 10/03/2020 0841   ALKPHOS 94 10/03/2020 0841   BILITOT 0.3 10/03/2020 0841   PROT 7.8 10/03/2020 0841   ALBUMIN 3.7 10/03/2020 0841    RADIOGRAPHIC STUDIES: IR IMAGING GUIDED PORT INSERTION  Result Date: 09/29/2020 CLINICAL DATA:  History of rectal carcinoma and need for porta cath for continued chemotherapy. EXAM: IMPLANTED PORT A CATH PLACEMENT WITH ULTRASOUND AND FLUOROSCOPIC GUIDANCE ANESTHESIA/SEDATION: 2.0 mg IV Versed; 100 mcg IV Fentanyl Total Moderate Sedation Time:  32 minutes The patient's level of consciousness and physiologic status were continuously monitored during the procedure by Radiology nursing. FLUOROSCOPY TIME:  18 seconds.  2.0 mGy. PROCEDURE: The procedure, risks, benefits, and alternatives were explained to the patient. Questions regarding the procedure were encouraged and answered. The patient understands and consents to the procedure. A time-out was performed prior to initiating the procedure. Ultrasound was utilized to confirm patency of the right internal jugular vein. The right neck and chest were prepped with chlorhexidine in a sterile fashion, and a sterile drape was applied covering the operative field. Maximum  barrier sterile technique with sterile gowns and gloves were used for the procedure. Local anesthesia was provided with 1% lidocaine. After creating a small venotomy incision, a 21 gauge needle was advanced into the right internal jugular vein under direct, real-time ultrasound guidance. Ultrasound image documentation was performed. After securing guidewire access, an 8 Fr dilator was placed. A J-wire was kinked to measure appropriate catheter length. A subcutaneous port pocket was then created along the upper chest wall utilizing sharp and blunt dissection. Portable cautery was utilized. The pocket was irrigated with sterile saline. A single lumen power injectable port was chosen for placement. The 8 Fr catheter was tunneled from the port pocket site to the venotomy incision. The port was placed in the pocket. External catheter was trimmed to appropriate length based on guidewire measurement. At the venotomy, an 8 Fr peel-away sheath was placed over a guidewire. The catheter was then placed through the sheath and the sheath removed. Final catheter positioning was confirmed and documented with a fluoroscopic spot image. The port was accessed with a needle and aspirated and flushed with heparinized saline. The access needle was removed. The venotomy and port pocket incisions were closed with subcutaneous 3-0 Vicryl and subcuticular 4-0 Monocryl. Dermabond was applied to both incisions. COMPLICATIONS: COMPLICATIONS None FINDINGS: After catheter placement, the tip lies at the cavo-atrial junction. The catheter aspirates normally and is ready for immediate use. IMPRESSION: Placement of single lumen port a cath via right internal jugular vein. The catheter tip lies at the cavo-atrial junction. A power injectable port a cath was placed and is ready for immediate use. Electronically Signed   By: Irish Lack M.D.   On: 09/29/2020 09:54    PERFORMANCE STATUS (ECOG) : 1 - Symptomatic but completely ambulatory  Review  of Systems Unless otherwise noted, a complete review of systems is negative.  Physical Exam General: NAD Pulmonary: Unlabored Extremities: no edema, no joint deformities Skin: no rashes Neurological: Grossly nonfocal  IMPRESSION: Routine follow-up visit.  Patient seen in infusion.  Patient endorses worse pain over the past couple weeks, primarily related to voiding and self catheterizing.  He is now consistently taking 2 tablets of Percocet every 4 hours around-the-clock.  He feels like the OxyContin is not quite controlling the pain as well as before.  Discussed with Dr. Orlie Dakin.  We will plan to increase dose of OxyContin and continue Percocet as needed for breakthrough pain.  However,  I did encourage patient to discuss with his urologist about alternative options for catheterization.  Patient is waking hourly at night to self catheterize.  I wonder if he would benefit from an indwelling catheter to improve his quality of life.  PLAN: -Continue current scope of treatment -Continue alprazolam 0.5 mg twice daily as needed for anxiety -Increase OxyContin 15 mg every 8 hours, #90 -Continue Percocet 5-325mg  (1 to 2 tablets) every 4 hours as needed for breakthrough pain -Continue lactulose 15 to 30 mL twice daily as needed for constipation -Follow-up MyChart visit 1 month   Case and plan discussed with Dr. Grayland Ormond   Patient expressed understanding and was in agreement with this plan. He also understands that He can call the clinic at any time with any questions, concerns, or complaints.     Time Total: 15 minutes  Visit consisted of counseling and education dealing with the complex and emotionally intense issues of symptom management and palliative care in the setting of serious and potentially life-threatening illness.Greater than 50%  of this time was spent counseling and coordinating care related to the above assessment and plan.  Signed by: Altha Harm, PhD, NP-C

## 2020-10-03 NOTE — Progress Notes (Signed)
HR 105 ok to proceed per MD

## 2020-10-03 NOTE — Progress Notes (Signed)
[place]  Telephone:([phone] [phone]  Patient Care Team: Leonard Downing, MD as PCP - General (Family Medicine) [name], RN as Oncology Nurse Navigator   Name of the patient: Malik Lynch  [id number]  05/25/1971   Date of visit: 10/03/20  Diagnosis- Rectal Cancer  Chief complaint/Reason for visit- Initial Meeting for [facility], preparing for starting chemotherapy  Heme/Onc history:  Oncology History  Rectal cancer ([place])  06/22/2020 Initial Diagnosis   Rectal cancer ([place])   06/22/2020 Cancer Staging   Staging form: Colon and Rectum, AJCC 8th Edition - Clinical stage from 06/22/2020: Stage IIIB (cT3, cN1a, cM0) - Signed by Lloyd Huger, MD on 06/22/2020   06/22/2020 - 06/22/2020 Chemotherapy          Genetic Testing   Negative genetic testing. No pathogenic variants identified on the [employer] CancerNext-Expanded+RNA panel. The report date is 08/18/2020.   The CancerNext-Expanded + RNAinsight gene panel offered by [employer] and includes sequencing and rearrangement analysis for the following 77 genes: IP, ALK, APC*, ATM*, AXIN2, BAP1, BARD1, BLM, BMPR1A, BRCA1*, BRCA2*, BRIP1*, CDC73, CDH1*,CDK4, CDKN1B, CDKN2A, CHEK2*, CTNNA1, DICER1, FANCC, FH, FLCN, GALNT12, KIF1B, LZTR1, MAX, MEN1, MET, MLH1*, MSH2*, MSH3, MSH6*, MUTYH*, NBN, NF1*, NF2, NTHL1, PALB2*, PHOX2B, PMS2*, POT1, PRKAR1A, PTCH1, PTEN*, RAD51C*, RAD51D*,RB1, RECQL, RET, SDHA, SDHAF2, SDHB, SDHC, SDHD, SMAD4, SMARCA4, SMARCB1, SMARCE1, STK11, SUFU, TMEM127, TP53*,TSC1, TSC2, VHL and XRCC2 (sequencing and deletion/duplication); EGFR, EGLN1, HOXB13, KIT, MITF, PDGFRA, POLD1 and POLE (sequencing only); EPCAM and GREM1 (deletion/duplication only).    10/03/2020 -  Chemotherapy    Patient is on Treatment Plan: COLORECTAL FOLFOX Q14D X 4 MONTHS        Interval history-  Camil Wilhelmsen, 50 year old male, who  presents to chemo care clinic today for initial meeting in preparation for starting chemotherapy. I introduced the chemo care clinic and we discussed that the role of the clinic is to assist those who are at an increased risk of emergency room visits and/or complications during the course of chemotherapy treatment. We discussed that the increased risk takes into account factors such as age, performance status, and co-morbidities. We also discussed that for some, this might include barriers to care such as not having a primary care provider, lack of insurance/transportation, or not being able to afford medications. We discussed that the goal of the program is to help prevent unplanned ER visits and help reduce complications during chemotherapy. We do this by discussing specific risk factors to each individual and identifying ways that we can help improve these risk factors and reduce barriers to care.   ECOG FS:1 - Symptomatic but completely ambulatory  Review of systems- Review of Systems  Constitutional: Negative for chills, fever, malaise/fatigue and weight loss.  HENT: Negative for hearing loss, nosebleeds, sore throat and tinnitus.   Eyes: Negative for blurred vision and double vision.  Respiratory: Negative for cough, hemoptysis, shortness of breath and wheezing.   Cardiovascular: Negative for chest pain, palpitations and leg swelling.  Gastrointestinal: Negative for abdominal pain, blood in stool, constipation, diarrhea, melena, nausea and vomiting.  Genitourinary: Negative for dysuria and urgency.  Musculoskeletal: Negative for back pain, falls, joint pain and myalgias.  Skin: Negative for itching and rash.  Neurological: Negative for dizziness, tingling, sensory change, loss of consciousness, weakness and headaches.  Endo/Heme/Allergies: Negative for environmental allergies. Does not bruise/bleed easily.  Psychiatric/Behavioral: Negative for depression. The patient is not nervous/anxious and  does not have insomnia.      No Known Allergies  Past Medical History:  Diagnosis Date  . Family history of breast cancer   . Family history of colon cancer   . Family history of kidney cancer   . Family history of prostate cancer   . Family history of uterine cancer     Past Surgical History:  Procedure Laterality Date  . IR IMAGING GUIDED PORT INSERTION  09/29/2020    Social History   Socioeconomic History  . Marital status: Divorced    Spouse name: Not on file  . Number of children: Not on file  . Years of education: Not on file  . Highest education level: Not on file  Occupational History  . Not on file  Tobacco Use  . Smoking status: Current Some Day Smoker    Packs/day: 1.00    Types: Cigarettes  . Smokeless tobacco: Never Used  Vaping Use  . Vaping Use: Never used  Substance and Sexual Activity  . Alcohol use: Yes    Alcohol/week: 1.0 standard drink    Types: 1 Cans of beer per week  . Drug use: No  . Sexual activity: Not Currently  Other Topics Concern  . Not on file  Social History Narrative  . Not on file   Social Determinants of Health   Financial Resource Strain: Not on file  Food Insecurity: Not on file  Transportation Needs: Not on file  Physical Activity: Not on file  Stress: Not on file  Social Connections: Not on file  Intimate Partner Violence: Not on file    Family History  Problem Relation Age of Onset  . Throat cancer Mother 85  . Lymphoma Maternal Aunt   . Kidney cancer Maternal Uncle   . Breast cancer Paternal Aunt   . Colon cancer Paternal Uncle   . Lung cancer Maternal Grandmother   . Cancer Maternal Grandmother        mouth  . Prostate cancer Maternal Grandfather        dx 10s  . Colon cancer Maternal Grandfather        dx 27s  . Lung cancer Maternal Uncle   . Cancer Maternal Uncle        possibly kidney  . Uterine cancer Paternal Aunt   . Cancer Paternal Aunt        reproductive  . Kidney cancer Paternal Uncle   .  Breast cancer Cousin   . Breast cancer Cousin   . Colon cancer Cousin        dx 98s     Current Outpatient Medications:  .  ALPRAZolam (XANAX) 0.5 MG tablet, Take 1 tablet (0.5 mg total) by mouth 2 (two) times daily as needed for anxiety., Disp: 60 tablet, Rfl: 0 .  capecitabine (XELODA) 500 MG tablet, Take by mouth 2 (two) times daily after a meal. 1500 mg (3 tabs) twice a day., Disp: , Rfl:  .  cyclobenzaprine (FLEXERIL) 10 MG tablet, Take 1 tablet (10 mg total) by mouth 3 (three) times daily as needed for muscle spasms., Disp: 60 tablet, Rfl: 0 .  ibuprofen (ADVIL,MOTRIN) 100 MG tablet, Take 800 mg by mouth every 6 (six) hours as needed., Disp: , Rfl:  .  lactulose (CHRONULAC) 10 GM/15ML solution, Take 15-30 mLs (10-20 g total) by mouth 2 (two) times daily as needed for severe constipation., Disp: 236 mL, Rfl: 0 .  lidocaine-prilocaine (EMLA) cream, Apply to affected area once (Patient not taking:  Reported on 09/29/2020), Disp: 30 g, Rfl: 3 .  loperamide (IMODIUM A-D) 2 MG tablet, Take 2 mg by mouth 4 (four) times daily as needed for diarrhea or loose stools., Disp: , Rfl:  .  ondansetron (ZOFRAN) 8 MG tablet, Take 1 tablet (8 mg total) by mouth every 8 (eight) hours as needed for nausea or vomiting., Disp: 45 tablet, Rfl: 0 .  oxyCODONE (OXYCONTIN) 10 mg 12 hr tablet, Take 1 tablet (10 mg total) by mouth every 8 (eight) hours., Disp: 90 tablet, Rfl: 0 .  oxyCODONE-acetaminophen (PERCOCET/ROXICET) 5-325 MG tablet, Take 1-2 tablets by mouth every 4 (four) hours as needed for severe pain., Disp: 90 tablet, Rfl: 0 .  polyethylene glycol (MIRALAX / GLYCOLAX) 17 g packet, Take 17 g by mouth daily., Disp: , Rfl:  .  senna (SENOKOT) 8.6 MG TABS tablet, Take 1 tablet (8.6 mg total) by mouth daily., Disp: 120 tablet, Rfl: 3 .  Sennosides-Docusate Sodium 8.6-50 MG CAPS, Take 1 tablet by mouth as needed for constipation., Disp: , Rfl:  .  tamsulosin (FLOMAX) 0.4 MG CAPS capsule, Take 0.4 mg by mouth  daily., Disp: , Rfl:   Physical exam: There were no vitals filed for this visit. Physical Exam Constitutional:      Appearance: He is not ill-appearing.  Pulmonary:     Effort: Pulmonary effort is normal.  Neurological:     Mental Status: He is alert and oriented to person, place, and time.  Psychiatric:        Mood and Affect: Mood normal.        Behavior: Behavior normal.      CMP Latest Ref Rng & Units 09/21/2020  Glucose 70 - 99 mg/dL 98  BUN 6 - 20 mg/dL 11  Creatinine 0.61 - 1.24 mg/dL 0.87  Sodium 135 - 145 mmol/L 133(L)  Potassium 3.5 - 5.1 mmol/L 3.8  Chloride 98 - 111 mmol/L 99  CO2 22 - 32 mmol/L 23  Calcium 8.9 - 10.3 mg/dL 9.1  Total Protein 6.5 - 8.1 g/dL 7.4  Total Bilirubin 0.3 - 1.2 mg/dL 0.4  Alkaline Phos 38 - 126 U/L 92  AST 15 - 41 U/L 16  ALT 0 - 44 U/L 15   CBC Latest Ref Rng & Units 09/21/2020  WBC 4.0 - 10.5 K/uL 7.8  Hemoglobin 13.0 - 17.0 g/dL 13.1  Hematocrit 39.0 - 52.0 % 37.6(L)  Platelets 150 - 400 K/uL 332    No images are attached to the encounter.  IR IMAGING GUIDED PORT INSERTION  Result Date: 09/29/2020 CLINICAL DATA:  History of rectal carcinoma and need for porta cath for continued chemotherapy. EXAM: IMPLANTED PORT A CATH PLACEMENT WITH ULTRASOUND AND FLUOROSCOPIC GUIDANCE ANESTHESIA/SEDATION: 2.0 mg IV Versed; 100 mcg IV Fentanyl Total Moderate Sedation Time:  32 minutes The patient's level of consciousness and physiologic status were continuously monitored during the procedure by Radiology nursing. FLUOROSCOPY TIME:  18 seconds.  2.0 mGy. PROCEDURE: The procedure, risks, benefits, and alternatives were explained to the patient. Questions regarding the procedure were encouraged and answered. The patient understands and consents to the procedure. A time-out was performed prior to initiating the procedure. Ultrasound was utilized to confirm patency of the right internal jugular vein. The right neck and chest were prepped with chlorhexidine  in a sterile fashion, and a sterile drape was applied covering the operative field. Maximum barrier sterile technique with sterile gowns and gloves were used for the procedure. Local anesthesia was provided with 1% lidocaine.  After creating a small venotomy incision, a 21 gauge needle was advanced into the right internal jugular vein under direct, real-time ultrasound guidance. Ultrasound image documentation was performed. After securing guidewire access, an 8 Fr dilator was placed. A J-wire was kinked to measure appropriate catheter length. A subcutaneous port pocket was then created along the upper chest wall utilizing sharp and blunt dissection. Portable cautery was utilized. The pocket was irrigated with sterile saline. A single lumen power injectable port was chosen for placement. The 8 Fr catheter was tunneled from the port pocket site to the venotomy incision. The port was placed in the pocket. External catheter was trimmed to appropriate length based on guidewire measurement. At the venotomy, an 8 Fr peel-away sheath was placed over a guidewire. The catheter was then placed through the sheath and the sheath removed. Final catheter positioning was confirmed and documented with a fluoroscopic spot image. The port was accessed with a needle and aspirated and flushed with heparinized saline. The access needle was removed. The venotomy and port pocket incisions were closed with subcutaneous 3-0 Vicryl and subcuticular 4-0 Monocryl. Dermabond was applied to both incisions. COMPLICATIONS: COMPLICATIONS None FINDINGS: After catheter placement, the tip lies at the cavo-atrial junction. The catheter aspirates normally and is ready for immediate use. IMPRESSION: Placement of single lumen port a cath via right internal jugular vein. The catheter tip lies at the cavo-atrial junction. A power injectable port a cath was placed and is ready for immediate use. Electronically Signed   By: Aletta Edouard M.D.   On: 09/29/2020  09:54     Assessment and plan- Patient is a 50 y.o. male who presents to [facility] for initial meeting in preparation for starting chemotherapy for the treatment of stage IIIb rectal cancer   1. Stage IIIb rectal cancer- plan for XRT and neoadjuvant chemotherapy with capecitabine.   2. Chemo Care Clinic/High Risk for ER/Hospitalization during chemotherapy- We discussed the role of the chemo care clinic and identified patient specific risk factors. I discussed that patient was identified as low risk.  3. Social Determinants of Health- we discussed that social determinants of health may have significant impacts on health and outcomes for cancer patients.  Today we discussed specific social determinants of performance status, alcohol use, depression, financial needs, food insecurity, housing, interpersonal violence, social connections, stress, tobacco use, and transportation.  We extensively discussed programs and services available through the cancer center including but not limited to outpatient occupational therapy, care program, counseling services, palliative medicine, symptom management clinic, social work, Public librarian, Building surveyor, support groups, nurse navigation, dietitian, smoking cessation, and transportation assistance.  We also discussed the role of the Symptom Management Clinic at [facility] for acute issues and methods of contacting clinic/provider. He denies needing specific assistance at this time and He will be followed by Mariea Clonts, RN (Nurse Navigator).   Visit Diagnosis 1. Rectal cancer [facility])     Patient expressed understanding and was in agreement with this plan. He also understands that He can call clinic at any time with any questions, concerns, or complaints.   A total of (15) minutes of face-to-face time was spent with this patient with greater than 50% of that time in counseling and care-coordination.  Beckey Rutter, DNP, AGNP-C [facility]

## 2020-10-03 NOTE — Progress Notes (Signed)
OK to treat with HR 105 per Dr. Grayland Ormond.

## 2020-10-04 LAB — CEA: CEA: 5.5 ng/mL — ABNORMAL HIGH (ref 0.0–4.7)

## 2020-10-04 NOTE — Progress Notes (Signed)
PSN spoke with patient's wife today via phone.  Patient will receive additional assistance from the [facility]

## 2020-10-05 ENCOUNTER — Telehealth: Payer: Self-pay

## 2020-10-05 ENCOUNTER — Inpatient Hospital Stay: Payer: Self-pay

## 2020-10-05 DIAGNOSIS — C20 Malignant neoplasm of rectum: Secondary | ICD-10-CM

## 2020-10-05 MED ORDER — HEPARIN SOD (PORK) LOCK FLUSH 100 UNIT/ML IV SOLN
500.0000 [IU] | Freq: Once | INTRAVENOUS | Status: AC | PRN
  Administered 2020-10-05: 500 [IU]
  Filled 2020-10-05: qty 5

## 2020-10-05 MED ORDER — HEPARIN SOD (PORK) LOCK FLUSH 100 UNIT/ML IV SOLN
INTRAVENOUS | Status: AC
  Filled 2020-10-05: qty 5

## 2020-10-05 MED ORDER — SODIUM CHLORIDE 0.9% FLUSH
10.0000 mL | INTRAVENOUS | Status: DC | PRN
Start: 2020-10-05 — End: 2020-10-05
  Administered 2020-10-05: 10 mL
  Filled 2020-10-05: qty 10

## 2020-10-05 NOTE — Telephone Encounter (Signed)
Telephone call to patient for follow up after receiving first infusion.   No answer but left message stating we were calling to check on them.  Encouraged patient to call for any questions or concerns.   

## 2020-10-07 NOTE — Progress Notes (Signed)
[place]  [phone] [phone]  ID: Malik Lynch OB: [birth year]  MR#: [id number]  QIO#:[id number]  Patient Care Team: Leonard Downing, MD as PCP - General (Family Medicine) [name], RN as Oncology Nurse Navigator  CHIEF COMPLAINT: Stage IIIb rectal cancer.  INTERVAL HISTORY: Patient returns to clinic today for repeat laboratory work and to assess his toleration of cycle 1 of neoadjuvant FOLFOX.  Patient reports he felt well after 2 to 3 days of receiving treatment, but then had increased weakness and fatigue over the weekend.  He also had some diarrhea, but this has improved.  He has a rash that he reports has been intermittent for 10 to 15 years.  His pain has improved with increasing his narcotics recently.  He has no neurologic complaints. He denies any recent fevers or illnesses. He has no chest pain, shortness of breath, cough, or hemoptysis.  He denies any nausea or vomiting.  Patient offers no further specific complaints today.  REVIEW OF SYSTEMS:   Review of Systems  Constitutional: Positive for malaise/fatigue. Negative for fever and weight loss.  Respiratory: Negative.  Negative for cough, hemoptysis and shortness of breath.   Cardiovascular: Negative.  Negative for chest pain and leg swelling.  Gastrointestinal: Positive for abdominal pain and diarrhea. Negative for blood in stool and melena.  Genitourinary: Positive for urgency.  Musculoskeletal: Negative.  Negative for back pain.  Skin: Negative.  Negative for rash.  Neurological: Positive for weakness. Negative for dizziness, focal weakness and headaches.  Psychiatric/Behavioral: Negative.  The patient is not nervous/anxious.     As per HPI. Otherwise, a complete review of systems is negative.  PAST MEDICAL HISTORY: Past Medical History:  Diagnosis Date  . Family history of breast cancer   . Family history of colon cancer   . Family history of kidney  cancer   . Family history of prostate cancer   . Family history of uterine cancer     PAST SURGICAL HISTORY: Past Surgical History:  Procedure Laterality Date  . IR IMAGING GUIDED PORT INSERTION  09/29/2020    FAMILY HISTORY: Family History  Problem Relation Age of Onset  . Throat cancer Mother 24  . Lymphoma Maternal Aunt   . Kidney cancer Maternal Uncle   . Breast cancer Paternal Aunt   . Colon cancer Paternal Uncle   . Lung cancer Maternal Grandmother   . Cancer Maternal Grandmother        mouth  . Prostate cancer Maternal Grandfather        dx 19s  . Colon cancer Maternal Grandfather        dx 6s  . Lung cancer Maternal Uncle   . Cancer Maternal Uncle        possibly kidney  . Uterine cancer Paternal Aunt   . Cancer Paternal Aunt        reproductive  . Kidney cancer Paternal Uncle   . Breast cancer Cousin   . Breast cancer Cousin   . Colon cancer Cousin        dx 68s    ADVANCED DIRECTIVES (Y/N):  N  HEALTH MAINTENANCE: Social History   Tobacco Use  . Smoking status: Current Some Day Smoker    Packs/day: 1.00    Types: Cigarettes  . Smokeless tobacco: Never Used  Vaping Use  . Vaping Use: Never used  Substance Use Topics  . Alcohol use: Yes    Alcohol/week: 1.0 standard drink    Types:  1 Cans of beer per week  . Drug use: No     Colonoscopy:  PAP:  Bone density:  Lipid panel:  No Known Allergies  Current Outpatient Medications  Medication Sig Dispense Refill  . ALPRAZolam (XANAX) 0.5 MG tablet Take 1 tablet (0.5 mg total) by mouth 2 (two) times daily as needed for anxiety. 60 tablet 0  . cyclobenzaprine (FLEXERIL) 10 MG tablet Take 1 tablet (10 mg total) by mouth 3 (three) times daily as needed for muscle spasms. 60 tablet 0  . ibuprofen (ADVIL,MOTRIN) 100 MG tablet Take 800 mg by mouth every 6 (six) hours as needed.    . lactulose (CHRONULAC) 10 GM/15ML solution Take 15-30 mLs (10-20 g total) by mouth 2 (two) times daily as needed for severe  constipation. 236 mL 0  . lidocaine-prilocaine (EMLA) cream Apply to affected area once 30 g 3  . loperamide (IMODIUM A-D) 2 MG tablet Take 2 mg by mouth 4 (four) times daily as needed for diarrhea or loose stools.    . ondansetron (ZOFRAN) 8 MG tablet Take 1 tablet (8 mg total) by mouth every 8 (eight) hours as needed for nausea or vomiting. 45 tablet 0  . oxyCODONE (OXYCONTIN) 15 mg 12 hr tablet Take 1 tablet (15 mg total) by mouth every 8 (eight) hours. 90 tablet 0  . oxyCODONE-acetaminophen (PERCOCET/ROXICET) 5-325 MG tablet Take 1-2 tablets by mouth every 4 (four) hours as needed for severe pain. 90 tablet 0  . polyethylene glycol (MIRALAX / GLYCOLAX) 17 g packet Take 17 g by mouth daily.    . tamsulosin (FLOMAX) 0.4 MG CAPS capsule Take 0.4 mg by mouth daily.    . capecitabine (XELODA) 500 MG tablet Take by mouth 2 (two) times daily after a meal. 1500 mg (3 tabs) twice a day. (Patient not taking: No sig reported)    . predniSONE (STERAPRED UNI-PAK 21 TAB) 10 MG (21) TBPK tablet Take 6 tablets (60mg ) x 1 day, then take 5 tablets (50mg ) x 1 day, then take 4 tablets (40mg ) x 1 day, then take 3 tablets (30mg ) x 1 day, then take 2 tablets (20mg ) x 1 day, then take 1 tablet (10mg ) x 1 day, then stop 1 each 0  . senna (SENOKOT) 8.6 MG TABS tablet Take 1 tablet (8.6 mg total) by mouth daily. (Patient not taking: No sig reported) 120 tablet 3  . Sennosides-Docusate Sodium 8.6-50 MG CAPS Take 1 tablet by mouth as needed for constipation. (Patient not taking: No sig reported)     No current facility-administered medications for this visit.    OBJECTIVE: Vitals:   10/10/20 0957  BP: 138/82  Pulse: (!) 106  Resp: 20  Temp: (!) 97.5 F (36.4 C)     Body mass index is 21.11 kg/m.    ECOG FS:1 - Symptomatic but completely ambulatory  General: Well-developed, well-nourished, no acute distress. Eyes: Pink conjunctiva, anicteric sclera. HEENT: Normocephalic, moist mucous membranes. Lungs: No audible  wheezing or coughing. Heart: Regular rate and rhythm. Abdomen: Soft, nontender, no obvious distention. Musculoskeletal: No edema, cyanosis, or clubbing. Neuro: Alert, answering all questions appropriately. Cranial nerves grossly intact. Skin: No rashes or petechiae noted. Psych: Normal affect.   LAB RESULTS:  Lab Results  Component Value Date   NA 134 (L) 10/10/2020   K 3.4 (L) 10/10/2020   CL 98 10/10/2020   CO2 27 10/10/2020   GLUCOSE 79 10/10/2020   BUN 9 10/10/2020   CREATININE 0.83 10/10/2020   CALCIUM 9.0  10/10/2020   PROT 7.5 10/10/2020   ALBUMIN 3.8 10/10/2020   AST 19 10/10/2020   ALT 22 10/10/2020   ALKPHOS 80 10/10/2020   BILITOT 0.3 10/10/2020   GFRNONAA >60 10/10/2020    Lab Results  Component Value Date   WBC 7.7 10/10/2020   NEUTROABS 6.4 10/10/2020   HGB 12.3 (L) 10/10/2020   HCT 35.7 (L) 10/10/2020   MCV 98.9 10/10/2020   PLT 303 10/10/2020     STUDIES: IR IMAGING GUIDED PORT INSERTION  Result Date: 09/29/2020 CLINICAL DATA:  History of rectal carcinoma and need for porta cath for continued chemotherapy. EXAM: IMPLANTED PORT A CATH PLACEMENT WITH ULTRASOUND AND FLUOROSCOPIC GUIDANCE ANESTHESIA/SEDATION: 2.0 mg IV Versed; 100 mcg IV Fentanyl Total Moderate Sedation Time:  32 minutes The patient's level of consciousness and physiologic status were continuously monitored during the procedure by Radiology nursing. FLUOROSCOPY TIME:  18 seconds.  2.0 mGy. PROCEDURE: The procedure, risks, benefits, and alternatives were explained to the patient. Questions regarding the procedure were encouraged and answered. The patient understands and consents to the procedure. A time-out was performed prior to initiating the procedure. Ultrasound was utilized to confirm patency of the right internal jugular vein. The right neck and chest were prepped with chlorhexidine in a sterile fashion, and a sterile drape was applied covering the operative field. Maximum barrier sterile  technique with sterile gowns and gloves were used for the procedure. Local anesthesia was provided with 1% lidocaine. After creating a small venotomy incision, a 21 gauge needle was advanced into the right internal jugular vein under direct, real-time ultrasound guidance. Ultrasound image documentation was performed. After securing guidewire access, an 8 Fr dilator was placed. A J-wire was kinked to measure appropriate catheter length. A subcutaneous port pocket was then created along the upper chest wall utilizing sharp and blunt dissection. Portable cautery was utilized. The pocket was irrigated with sterile saline. A single lumen power injectable port was chosen for placement. The 8 Fr catheter was tunneled from the port pocket site to the venotomy incision. The port was placed in the pocket. External catheter was trimmed to appropriate length based on guidewire measurement. At the venotomy, an 8 Fr peel-away sheath was placed over a guidewire. The catheter was then placed through the sheath and the sheath removed. Final catheter positioning was confirmed and documented with a fluoroscopic spot image. The port was accessed with a needle and aspirated and flushed with heparinized saline. The access needle was removed. The venotomy and port pocket incisions were closed with subcutaneous 3-0 Vicryl and subcuticular 4-0 Monocryl. Dermabond was applied to both incisions. COMPLICATIONS: COMPLICATIONS None FINDINGS: After catheter placement, the tip lies at the cavo-atrial junction. The catheter aspirates normally and is ready for immediate use. IMPRESSION: Placement of single lumen port a cath via right internal jugular vein. The catheter tip lies at the cavo-atrial junction. A power injectable port a cath was placed and is ready for immediate use. Electronically Signed   By: Aletta Edouard M.D.   On: 09/29/2020 09:54    ASSESSMENT: Stage IIIb rectal cancer.  PLAN:    1.  Stage IIIb rectal cancer: Pathology and  imaging results reviewed independently.  EUS completed at outside facility confirmed stage of disease.  PET scan results from June 28, 2020 reviewed independently with no obvious malignancy outside of patient's known rectal mass.  Patient completed his neoadjuvant treatment with chemotherapy and XRT on August 17, 2020.  His recent appointment with [facility] GI surgery recommended  continued neoadjuvant treatment with FOLFOX prior to surgical resection.  Patient does not wish to use capecitabine any further.  He tolerated cycle 1 of 8 of neoadjuvant FOLFOX last week.  Return to clinic in 1 week for further evaluation and consideration of cycle 2.   2.  Weight loss: Improved.  Appreciate dietary input. 3.  Pain: Improving.  Continue OxyContin to 15 mg every 8 hours and continue Percocet every 4 hours as needed.  Appreciate palliative care input.   4.  Left kidney mass: 2.8 x 3.6 x 3.4 mass highly concerning for a second malignancy.  Patient will likely have to undergo partial nephrectomy at the time of his rectal surgery.  He has had consultation with urology at [facility]. 5.  Urinary retention: [place] urology input.  Patient is now doing self-catheterization, but states this has improved as well. 6.  Diarrhea: Mildly improved.  Continue Imodium and Lomotil as prescribed. 7.  Hypokalemia: Mild, monitor. 8.  Anxiety: Chronic and unchanged.  Appreciate palliative care input. 9.  Rash: Unclear etiology.  Patient was given a 6-day steroid taper.  Patient expressed understanding and was in agreement with this plan. He also understands that He can call clinic at any time with any questions, concerns, or complaints.   Cancer Staging Rectal cancer [facility]) Staging form: Colon and Rectum, AJCC 8th Edition - Clinical stage from 06/22/2020: Stage IIIB (cT3, cN1a, cM0) - Signed by Lloyd Huger, MD on 06/22/2020 Stage prefix: Initial diagnosis Total positive nodes: 1   Lloyd Huger, MD   10/10/2020 1:54  PM

## 2020-10-09 ENCOUNTER — Telehealth: Payer: Self-pay | Admitting: *Deleted

## 2020-10-09 NOTE — Telephone Encounter (Signed)
Patient wife called stating that My Chart message received and that patient nor herself are willing to take the COVID vaccine and is asking how this will affect his treatment going forward. She requests a return call to discuss

## 2020-10-09 NOTE — Telephone Encounter (Signed)
I tried calling her but did not reach her. VM left.

## 2020-10-10 ENCOUNTER — Inpatient Hospital Stay (HOSPITAL_BASED_OUTPATIENT_CLINIC_OR_DEPARTMENT_OTHER): Payer: Self-pay | Admitting: Oncology

## 2020-10-10 ENCOUNTER — Inpatient Hospital Stay (HOSPITAL_BASED_OUTPATIENT_CLINIC_OR_DEPARTMENT_OTHER): Payer: Self-pay | Admitting: Hospice and Palliative Medicine

## 2020-10-10 ENCOUNTER — Encounter: Payer: Self-pay | Admitting: Oncology

## 2020-10-10 ENCOUNTER — Inpatient Hospital Stay: Payer: Self-pay

## 2020-10-10 VITALS — BP 138/82 | HR 106 | Temp 97.5°F | Resp 20 | Wt 164.4 lb

## 2020-10-10 DIAGNOSIS — Z515 Encounter for palliative care: Secondary | ICD-10-CM

## 2020-10-10 DIAGNOSIS — C20 Malignant neoplasm of rectum: Secondary | ICD-10-CM

## 2020-10-10 LAB — CBC WITH DIFFERENTIAL/PLATELET
Abs Immature Granulocytes: 0.04 10*3/uL (ref 0.00–0.07)
Basophils Absolute: 0 10*3/uL (ref 0.0–0.1)
Basophils Relative: 0 %
Eosinophils Absolute: 0.2 10*3/uL (ref 0.0–0.5)
Eosinophils Relative: 3 %
HCT: 35.7 % — ABNORMAL LOW (ref 39.0–52.0)
Hemoglobin: 12.3 g/dL — ABNORMAL LOW (ref 13.0–17.0)
Immature Granulocytes: 1 %
Lymphocytes Relative: 7 %
Lymphs Abs: 0.5 10*3/uL — ABNORMAL LOW (ref 0.7–4.0)
MCH: 34.1 pg — ABNORMAL HIGH (ref 26.0–34.0)
MCHC: 34.5 g/dL (ref 30.0–36.0)
MCV: 98.9 fL (ref 80.0–100.0)
Monocytes Absolute: 0.5 10*3/uL (ref 0.1–1.0)
Monocytes Relative: 6 %
Neutro Abs: 6.4 10*3/uL (ref 1.7–7.7)
Neutrophils Relative %: 83 %
Platelets: 303 10*3/uL (ref 150–400)
RBC: 3.61 MIL/uL — ABNORMAL LOW (ref 4.22–5.81)
RDW: 13.4 % (ref 11.5–15.5)
WBC: 7.7 10*3/uL (ref 4.0–10.5)
nRBC: 0 % (ref 0.0–0.2)

## 2020-10-10 LAB — COMPREHENSIVE METABOLIC PANEL
ALT: 22 U/L (ref 0–44)
AST: 19 U/L (ref 15–41)
Albumin: 3.8 g/dL (ref 3.5–5.0)
Alkaline Phosphatase: 80 U/L (ref 38–126)
Anion gap: 9 (ref 5–15)
BUN: 9 mg/dL (ref 6–20)
CO2: 27 mmol/L (ref 22–32)
Calcium: 9 mg/dL (ref 8.9–10.3)
Chloride: 98 mmol/L (ref 98–111)
Creatinine, Ser: 0.83 mg/dL (ref 0.61–1.24)
GFR, Estimated: >60 mL/min (ref >60)
Glucose, Bld: 79 mg/dL (ref 70–99)
Potassium: 3.4 mmol/L — ABNORMAL LOW (ref 3.5–5.1)
Sodium: 134 mmol/L — ABNORMAL LOW (ref 135–145)
Total Bilirubin: 0.3 mg/dL (ref 0.3–1.2)
Total Protein: 7.5 g/dL (ref 6.5–8.1)

## 2020-10-10 MED ORDER — PREDNISONE 10 MG (21) PO TBPK: ORAL_TABLET | ORAL | 0 refills | Status: DC

## 2020-10-10 NOTE — Progress Notes (Signed)
[place]  Telephone:([phone] [phone]   Name: Malik Lynch Date: 10/10/2020 MRN: [id number]  DOB: Jan 06, 1971  Patient Care Team: Leonard Downing, MD as PCP - General (Family Medicine) [name], RN as Oncology Nurse Navigator    REASON FOR CONSULTATION: Malik Lynch is a 50 y.o. male with multiple medical problems including stage IIIb rectal cancer on XRT and neoadjuvant chemotherapy with capecitabine.  Patient has had pain, weight loss, and anxiety.  He was referred to palliative care to help address goals and manage ongoing symptoms.  SOCIAL HISTORY:     reports that he has been smoking cigarettes. He has been smoking about 1.00 pack per day. He has never used smokeless tobacco. He reports current alcohol use of about 1.0 standard drink of alcohol per week. He reports that he does not use drugs.  Patient is married and lives at home with his wife.  He has 2 sons who live nearby.  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    ADVANCE DIRECTIVES:  Not on file  CODE STATUS:   PAST MEDICAL HISTORY: Past Medical History:  Diagnosis Date  . Family history of breast cancer   . Family history of colon cancer   . Family history of kidney cancer   . Family history of prostate cancer   . Family history of uterine cancer     PAST SURGICAL HISTORY:  Past Surgical History:  Procedure Laterality Date  . IR IMAGING GUIDED PORT INSERTION  09/29/2020    HEMATOLOGY/ONCOLOGY HISTORY:  Oncology History  Rectal cancer ([place])  06/22/2020 Initial Diagnosis   Rectal cancer ([place])   06/22/2020 Cancer Staging   Staging form: Colon and Rectum, AJCC 8th Edition - Clinical stage from 06/22/2020: Stage IIIB (cT3, cN1a, cM0) - Signed by Lloyd Huger, MD on 06/22/2020   06/22/2020 - 06/22/2020 Chemotherapy          Genetic Testing   Negative genetic testing. No  pathogenic variants identified on the [employer] CancerNext-Expanded+RNA panel. The report date is 08/18/2020.   The CancerNext-Expanded + RNAinsight gene panel offered by [employer] and includes sequencing and rearrangement analysis for the following 77 genes: IP, ALK, APC*, ATM*, AXIN2, BAP1, BARD1, BLM, BMPR1A, BRCA1*, BRCA2*, BRIP1*, CDC73, CDH1*,CDK4, CDKN1B, CDKN2A, CHEK2*, CTNNA1, DICER1, FANCC, FH, FLCN, GALNT12, KIF1B, LZTR1, MAX, MEN1, MET, MLH1*, MSH2*, MSH3, MSH6*, MUTYH*, NBN, NF1*, NF2, NTHL1, PALB2*, PHOX2B, PMS2*, POT1, PRKAR1A, PTCH1, PTEN*, RAD51C*, RAD51D*,RB1, RECQL, RET, SDHA, SDHAF2, SDHB, SDHC, SDHD, SMAD4, SMARCA4, SMARCB1, SMARCE1, STK11, SUFU, TMEM127, TP53*,TSC1, TSC2, VHL and XRCC2 (sequencing and deletion/duplication); EGFR, EGLN1, HOXB13, KIT, MITF, PDGFRA, POLD1 and POLE (sequencing only); EPCAM and GREM1 (deletion/duplication only).    10/03/2020 -  Chemotherapy    Patient is on Treatment Plan: COLORECTAL FOLFOX Q14D X 4 MONTHS        ALLERGIES:  has No Known Allergies.  MEDICATIONS:  Current Outpatient Medications  Medication Sig Dispense Refill  . ALPRAZolam (XANAX) 0.5 MG tablet Take 1 tablet (0.5 mg total) by mouth 2 (two) times daily as needed for anxiety. 60 tablet 0  . capecitabine (XELODA) 500 MG tablet Take by mouth 2 (two) times daily after a meal. 1500 mg (3 tabs) twice a day. (Patient not taking: No sig reported)    . cyclobenzaprine (FLEXERIL) 10 MG tablet Take 1 tablet (10 mg total) by mouth 3 (three) times daily as needed for muscle spasms. 60 tablet  0  . ibuprofen (ADVIL,MOTRIN) 100 MG tablet Take 800 mg by mouth every 6 (six) hours as needed.    . lactulose (CHRONULAC) 10 GM/15ML solution Take 15-30 mLs (10-20 g total) by mouth 2 (two) times daily as needed for severe constipation. 236 mL 0  . lidocaine-prilocaine (EMLA) cream Apply to affected area once 30 g 3  . loperamide (IMODIUM A-D) 2 MG tablet Take 2 mg by mouth 4 (four) times daily as  needed for diarrhea or loose stools.    . ondansetron (ZOFRAN) 8 MG tablet Take 1 tablet (8 mg total) by mouth every 8 (eight) hours as needed for nausea or vomiting. 45 tablet 0  . oxyCODONE (OXYCONTIN) 15 mg 12 hr tablet Take 1 tablet (15 mg total) by mouth every 8 (eight) hours. 90 tablet 0  . oxyCODONE-acetaminophen (PERCOCET/ROXICET) 5-325 MG tablet Take 1-2 tablets by mouth every 4 (four) hours as needed for severe pain. 90 tablet 0  . polyethylene glycol (MIRALAX / GLYCOLAX) 17 g packet Take 17 g by mouth daily.    Marland Kitchen senna (SENOKOT) 8.6 MG TABS tablet Take 1 tablet (8.6 mg total) by mouth daily. (Patient not taking: No sig reported) 120 tablet 3  . Sennosides-Docusate Sodium 8.6-50 MG CAPS Take 1 tablet by mouth as needed for constipation. (Patient not taking: No sig reported)    . tamsulosin (FLOMAX) 0.4 MG CAPS capsule Take 0.4 mg by mouth daily.     No current facility-administered medications for this visit.    VITAL SIGNS: There were no vitals taken for this visit. There were no vitals filed for this visit.  Estimated body mass index is 21.11 kg/m as calculated from the following:   Height as of 09/29/20: $RemoveBe'6\' 2"'xuTBjaSDY$  (1.88 m).   Weight as of an earlier encounter on 10/10/20: 164 lb 6.4 oz (74.6 kg).  LABS: CBC:    Component Value Date/Time   WBC 7.7 10/10/2020 0935   HGB 12.3 (L) 10/10/2020 0935   HCT 35.7 (L) 10/10/2020 0935   PLT 303 10/10/2020 0935   MCV 98.9 10/10/2020 0935   NEUTROABS 6.4 10/10/2020 0935   LYMPHSABS 0.5 (L) 10/10/2020 0935   MONOABS 0.5 10/10/2020 0935   EOSABS 0.2 10/10/2020 0935   BASOSABS 0.0 10/10/2020 0935   Comprehensive Metabolic Panel:    Component Value Date/Time   NA 134 (L) 10/10/2020 0935   K 3.4 (L) 10/10/2020 0935   CL 98 10/10/2020 0935   CO2 27 10/10/2020 0935   BUN 9 10/10/2020 0935   CREATININE 0.83 10/10/2020 0935   GLUCOSE 79 10/10/2020 0935   CALCIUM 9.0 10/10/2020 0935   AST 19 10/10/2020 0935   ALT 22 10/10/2020 0935    ALKPHOS 80 10/10/2020 0935   BILITOT 0.3 10/10/2020 0935   PROT 7.5 10/10/2020 0935   ALBUMIN 3.8 10/10/2020 0935    RADIOGRAPHIC STUDIES: IR IMAGING GUIDED PORT INSERTION  Result Date: 09/29/2020 CLINICAL DATA:  History of rectal carcinoma and need for porta cath for continued chemotherapy. EXAM: IMPLANTED PORT A CATH PLACEMENT WITH ULTRASOUND AND FLUOROSCOPIC GUIDANCE ANESTHESIA/SEDATION: 2.0 mg IV Versed; 100 mcg IV Fentanyl Total Moderate Sedation Time:  32 minutes The patient's level of consciousness and physiologic status were continuously monitored during the procedure by Radiology nursing. FLUOROSCOPY TIME:  18 seconds.  2.0 mGy. PROCEDURE: The procedure, risks, benefits, and alternatives were explained to the patient. Questions regarding the procedure were encouraged and answered. The patient understands and consents to the procedure. A time-out was performed  prior to initiating the procedure. Ultrasound was utilized to confirm patency of the right internal jugular vein. The right neck and chest were prepped with chlorhexidine in a sterile fashion, and a sterile drape was applied covering the operative field. Maximum barrier sterile technique with sterile gowns and gloves were used for the procedure. Local anesthesia was provided with 1% lidocaine. After creating a small venotomy incision, a 21 gauge needle was advanced into the right internal jugular vein under direct, real-time ultrasound guidance. Ultrasound image documentation was performed. After securing guidewire access, an 8 Fr dilator was placed. A J-wire was kinked to measure appropriate catheter length. A subcutaneous port pocket was then created along the upper chest wall utilizing sharp and blunt dissection. Portable cautery was utilized. The pocket was irrigated with sterile saline. A single lumen power injectable port was chosen for placement. The 8 Fr catheter was tunneled from the port pocket site to the venotomy incision. The port  was placed in the pocket. External catheter was trimmed to appropriate length based on guidewire measurement. At the venotomy, an 8 Fr peel-away sheath was placed over a guidewire. The catheter was then placed through the sheath and the sheath removed. Final catheter positioning was confirmed and documented with a fluoroscopic spot image. The port was accessed with a needle and aspirated and flushed with heparinized saline. The access needle was removed. The venotomy and port pocket incisions were closed with subcutaneous 3-0 Vicryl and subcuticular 4-0 Monocryl. Dermabond was applied to both incisions. COMPLICATIONS: COMPLICATIONS None FINDINGS: After catheter placement, the tip lies at the cavo-atrial junction. The catheter aspirates normally and is ready for immediate use. IMPRESSION: Placement of single lumen port a cath via right internal jugular vein. The catheter tip lies at the cavo-atrial junction. A power injectable port a cath was placed and is ready for immediate use. Electronically Signed   By: Aletta Edouard M.D.   On: 09/29/2020 09:54    PERFORMANCE STATUS (ECOG) : 1 - Symptomatic but completely ambulatory  Review of Systems Unless otherwise noted, a complete review of systems is negative.  Physical Exam General: NAD Pulmonary: Unlabored Extremities: no edema, no joint deformities Skin: Erythematous rash to torso (see image) Neurological: Grossly nonfocal  IMPRESSION: Routine follow-up visit.   Pain is reportedly stable on current regimen.  Patient endorses a pruritic and erythematous rash to torso.  He says that the rash has flared repeatedly over the past 15 years.  He has seen his PCP in the past who thought it was fungal but patient did not respond to antifungal creams.  Patient says that he has responded in the past to steroids.  We will start him on a course of prednisone taper over a week.  If no improvement, would recommend referral to dermatology.  Patient and wife had  questions about COVID vaccination, which we discussed in detail.  Patient is not interested in pursuing COVID vaccination at the present time.  We also discussed option of monoclonal antibody infusion if needed.  PLAN: -Continue current scope of treatment -Continue alprazolam 0.5 mg twice daily as needed for anxiety -Continue OxyContin 15 mg every 8 hours -Continue Percocet 5-325mg  (1 to 2 tablets) every 4 hours as needed for breakthrough pain -Continue lactulose 15 to 30 mL twice daily as needed for constipation -Prednisone taper -Follow-up MyChart visit 1 month   Case and plan discussed with Dr. Grayland Ormond   Patient expressed understanding and was in agreement with this plan. He also understands that He can  call the clinic at any time with any questions, concerns, or complaints.     Time Total: 25 minutes  Visit consisted of counseling and education dealing with the complex and emotionally intense issues of symptom management and palliative care in the setting of serious and potentially life-threatening illness.Greater than 50%  of this time was spent counseling and coordinating care related to the above assessment and plan.  Signed by: Altha Harm, PhD, NP-C

## 2020-10-10 NOTE — Progress Notes (Signed)
Patient here for follow up check . He has developed a rash that has been chronic but is worsening now that his  pain medication has been increased. It is on his face and torsor. His PCP has diagnosed as a fungal infection.

## 2020-10-11 ENCOUNTER — Other Ambulatory Visit: Payer: Self-pay | Admitting: *Deleted

## 2020-10-12 ENCOUNTER — Other Ambulatory Visit: Payer: Self-pay | Admitting: *Deleted

## 2020-10-12 MED ORDER — CYCLOBENZAPRINE HCL 10 MG PO TABS: 10.0000 mg | ORAL_TABLET | Freq: Three times a day (TID) | ORAL | 0 refills | Status: DC | PRN

## 2020-10-12 MED ORDER — ALPRAZOLAM 0.5 MG PO TABS
0.5000 mg | ORAL_TABLET | Freq: Two times a day (BID) | ORAL | 0 refills | Status: DC | PRN
Start: 2020-10-12 — End: 2020-10-24

## 2020-10-12 MED ORDER — OXYCODONE-ACETAMINOPHEN 5-325 MG PO TABS: 1.0000 | ORAL_TABLET | ORAL | 0 refills | Status: DC | PRN

## 2020-10-15 NOTE — Progress Notes (Signed)
[facility]  [phone] [phone]  ID: DSHAWN Lynch OB: 03-May-1971  MR#: [id number]  KPT#:[id number]  Patient Care Team: Leonard Downing, MD as PCP - General (Family Medicine) [name], RN as Oncology Nurse Navigator  CHIEF COMPLAINT: Stage IIIb rectal cancer.  INTERVAL HISTORY: Patient returns to clinic today for repeat laboratory work and consideration of cycle 2 of neoadjuvant FOLFOX.  He now has intermittent constipation which alternates with diarrhea right after his treatments.  He continues to have pain as well as chronic weakness and fatigue.  Prednisone taper did not help his rash.  He has no neurologic complaints. He denies any recent fevers or illnesses. He has no chest pain, shortness of breath, cough, or hemoptysis.  He denies any nausea or vomiting.  He continues to require self-catheterization to empty his bladder.  Patient offers no further specific complaints today.  REVIEW OF SYSTEMS:   Review of Systems  Constitutional: Positive for malaise/fatigue. Negative for fever and weight loss.  Respiratory: Negative.  Negative for cough, hemoptysis and shortness of breath.   Cardiovascular: Negative.  Negative for chest pain and leg swelling.  Gastrointestinal: Positive for abdominal pain and diarrhea. Negative for blood in stool and melena.  Genitourinary: Positive for urgency.  Musculoskeletal: Negative.  Negative for back pain.  Skin: Negative.  Negative for rash.  Neurological: Positive for weakness. Negative for dizziness, focal weakness and headaches.  Psychiatric/Behavioral: Negative.  The patient is not nervous/anxious.     As per HPI. Otherwise, a complete review of systems is negative.  PAST MEDICAL HISTORY: Past Medical History:  Diagnosis Date  . Family history of breast cancer   . Family history of colon cancer   . Family history of kidney cancer   . Family history of prostate cancer   . Family  history of uterine cancer     PAST SURGICAL HISTORY: Past Surgical History:  Procedure Laterality Date  . IR IMAGING GUIDED PORT INSERTION  09/29/2020    FAMILY HISTORY: Family History  Problem Relation Age of Onset  . Throat cancer Mother 32  . Lymphoma Maternal Aunt   . Kidney cancer Maternal Uncle   . Breast cancer Paternal Aunt   . Colon cancer Paternal Uncle   . Lung cancer Maternal Grandmother   . Cancer Maternal Grandmother        mouth  . Prostate cancer Maternal Grandfather        dx 38s  . Colon cancer Maternal Grandfather        dx 60s  . Lung cancer Maternal Uncle   . Cancer Maternal Uncle        possibly kidney  . Uterine cancer Paternal Aunt   . Cancer Paternal Aunt        reproductive  . Kidney cancer Paternal Uncle   . Breast cancer Cousin   . Breast cancer Cousin   . Colon cancer Cousin        dx 88s    ADVANCED DIRECTIVES (Y/N):  N  HEALTH MAINTENANCE: Social History   Tobacco Use  . Smoking status: Current Some Day Smoker    Packs/day: 1.00    Types: Cigarettes  . Smokeless tobacco: Never Used  Vaping Use  . Vaping Use: Never used  Substance Use Topics  . Alcohol use: Yes    Alcohol/week: 1.0 standard drink    Types: 1 Cans of beer per week  . Drug use: No     Colonoscopy:  PAP:  Bone density:  Lipid panel:  No Known Allergies  Current Outpatient Medications  Medication Sig Dispense Refill  . ALPRAZolam (XANAX) 0.5 MG tablet Take 1 tablet (0.5 mg total) by mouth 2 (two) times daily as needed for anxiety. 60 tablet 0  . cyclobenzaprine (FLEXERIL) 10 MG tablet Take 1 tablet (10 mg total) by mouth 3 (three) times daily as needed for muscle spasms. 60 tablet 0  . ibuprofen (ADVIL,MOTRIN) 100 MG tablet Take 800 mg by mouth every 6 (six) hours as needed.    . lactulose (CHRONULAC) 10 GM/15ML solution Take 15-30 mLs (10-20 g total) by mouth 2 (two) times daily as needed for severe constipation. 236 mL 0  . lidocaine-prilocaine (EMLA)  cream Apply to affected area once 30 g 3  . ondansetron (ZOFRAN) 8 MG tablet Take 1 tablet (8 mg total) by mouth every 8 (eight) hours as needed for nausea or vomiting. 45 tablet 0  . oxyCODONE (OXYCONTIN) 15 mg 12 hr tablet Take 1 tablet (15 mg total) by mouth every 8 (eight) hours. 90 tablet 0  . oxyCODONE-acetaminophen (PERCOCET/ROXICET) 5-325 MG tablet Take 1-2 tablets by mouth every 4 (four) hours as needed for severe pain. 90 tablet 0  . polyethylene glycol (MIRALAX / GLYCOLAX) 17 g packet Take 17 g by mouth daily.    . tamsulosin (FLOMAX) 0.4 MG CAPS capsule Take 0.4 mg by mouth daily.    Marland Kitchen loperamide (IMODIUM A-D) 2 MG tablet Take 2 mg by mouth 4 (four) times daily as needed for diarrhea or loose stools. (Patient not taking: Reported on 10/17/2020)    . senna (SENOKOT) 8.6 MG TABS tablet Take 1 tablet (8.6 mg total) by mouth daily. (Patient not taking: No sig reported) 120 tablet 3  . Sennosides-Docusate Sodium 8.6-50 MG CAPS Take 1 tablet by mouth as needed for constipation. (Patient not taking: No sig reported)     No current facility-administered medications for this visit.   Facility-Administered Medications Ordered in Other Visits  Medication Dose Route Frequency Provider Last Rate Last Admin  . dexamethasone (DECADRON) 10 mg in sodium chloride 0.9 % 50 mL IVPB  10 mg Intravenous Once Lloyd Huger, MD      . fluorouracil (ADRUCIL) 4,800 mg in sodium chloride 0.9 % 54 mL chemo infusion  2,400 mg/m2 (Treatment Plan Recorded) Intravenous 1 day or 1 dose Lloyd Huger, MD      . fluorouracil (ADRUCIL) chemo injection 800 mg  400 mg/m2 (Treatment Plan Recorded) Intravenous Once Lloyd Huger, MD      . leucovorin 800 mg in dextrose 5 % 250 mL infusion  402 mg/m2 (Treatment Plan Recorded) Intravenous Once Lloyd Huger, MD      . oxaliplatin (ELOXATIN) 170 mg in dextrose 5 % 500 mL chemo infusion  85 mg/m2 (Treatment Plan Recorded) Intravenous Once Lloyd Huger, MD      . sodium chloride flush (NS) 0.9 % injection 10 mL  10 mL Intravenous PRN Lloyd Huger, MD   10 mL at 10/17/20 0838    OBJECTIVE: Vitals:   10/17/20 0851  BP: 119/79  Pulse: (!) 102  Resp: 16  Temp: 98.4 F (36.9 C)  SpO2: 98%     Body mass index is 21.83 kg/m.    ECOG FS:1 - Symptomatic but completely ambulatory  General: Well-developed, well-nourished, no acute distress. Eyes: Pink conjunctiva, anicteric sclera. HEENT: Normocephalic, moist mucous membranes. Lungs: No audible wheezing or coughing. Heart: Regular rate and rhythm. Abdomen:  Soft, nontender, no obvious distention. Musculoskeletal: No edema, cyanosis, or clubbing. Neuro: Alert, answering all questions appropriately. Cranial nerves grossly intact. Skin: No rashes or petechiae noted. Psych: Normal affect.   LAB RESULTS:  Lab Results  Component Value Date   NA 139 10/17/2020   K 3.4 (L) 10/17/2020   CL 100 10/17/2020   CO2 29 10/17/2020   GLUCOSE 99 10/17/2020   BUN 12 10/17/2020   CREATININE 0.88 10/17/2020   CALCIUM 9.2 10/17/2020   PROT 7.1 10/17/2020   ALBUMIN 3.4 (L) 10/17/2020   AST 37 10/17/2020   ALT 97 (H) 10/17/2020   ALKPHOS 81 10/17/2020   BILITOT 0.3 10/17/2020   GFRNONAA >60 10/17/2020    Lab Results  Component Value Date   WBC 7.2 10/17/2020   NEUTROABS 5.3 10/17/2020   HGB 12.2 (L) 10/17/2020   HCT 35.8 (L) 10/17/2020   MCV 98.9 10/17/2020   PLT 298 10/17/2020     STUDIES: IR IMAGING GUIDED PORT INSERTION  Result Date: 09/29/2020 CLINICAL DATA:  History of rectal carcinoma and need for porta cath for continued chemotherapy. EXAM: IMPLANTED PORT A CATH PLACEMENT WITH ULTRASOUND AND FLUOROSCOPIC GUIDANCE ANESTHESIA/SEDATION: 2.0 mg IV Versed; 100 mcg IV Fentanyl Total Moderate Sedation Time:  32 minutes The patient's level of consciousness and physiologic status were continuously monitored during the procedure by Radiology nursing. FLUOROSCOPY TIME:  18 seconds.   2.0 mGy. PROCEDURE: The procedure, risks, benefits, and alternatives were explained to the patient. Questions regarding the procedure were encouraged and answered. The patient understands and consents to the procedure. A time-out was performed prior to initiating the procedure. Ultrasound was utilized to confirm patency of the right internal jugular vein. The right neck and chest were prepped with chlorhexidine in a sterile fashion, and a sterile drape was applied covering the operative field. Maximum barrier sterile technique with sterile gowns and gloves were used for the procedure. Local anesthesia was provided with 1% lidocaine. After creating a small venotomy incision, a 21 gauge needle was advanced into the right internal jugular vein under direct, real-time ultrasound guidance. Ultrasound image documentation was performed. After securing guidewire access, an 8 Fr dilator was placed. A J-wire was kinked to measure appropriate catheter length. A subcutaneous port pocket was then created along the upper chest wall utilizing sharp and blunt dissection. Portable cautery was utilized. The pocket was irrigated with sterile saline. A single lumen power injectable port was chosen for placement. The 8 Fr catheter was tunneled from the port pocket site to the venotomy incision. The port was placed in the pocket. External catheter was trimmed to appropriate length based on guidewire measurement. At the venotomy, an 8 Fr peel-away sheath was placed over a guidewire. The catheter was then placed through the sheath and the sheath removed. Final catheter positioning was confirmed and documented with a fluoroscopic spot image. The port was accessed with a needle and aspirated and flushed with heparinized saline. The access needle was removed. The venotomy and port pocket incisions were closed with subcutaneous 3-0 Vicryl and subcuticular 4-0 Monocryl. Dermabond was applied to both incisions. COMPLICATIONS: COMPLICATIONS None  FINDINGS: After catheter placement, the tip lies at the cavo-atrial junction. The catheter aspirates normally and is ready for immediate use. IMPRESSION: Placement of single lumen port a cath via right internal jugular vein. The catheter tip lies at the cavo-atrial junction. A power injectable port a cath was placed and is ready for immediate use. Electronically Signed   By: Jenness Corner.D.  On: 09/29/2020 09:54    ASSESSMENT: Stage IIIb rectal cancer.  PLAN:    1.  Stage IIIb rectal cancer: Pathology and imaging results reviewed independently.  EUS completed at outside facility confirmed stage of disease.  PET scan results from June 28, 2020 reviewed independently with no obvious malignancy outside of patient's known rectal mass.  Patient completed his neoadjuvant treatment with chemotherapy and XRT on August 17, 2020.  His recent appointment with [facility] GI surgery recommended continued neoadjuvant treatment with FOLFOX prior to surgical resection.  Patient does not wish to use capecitabine any further.  Proceed with cycle 2 of 8 of neoadjuvant FOLFOX today.  Return to clinic in 2 weeks for further evaluation and consideration of cycle 3. 2.  Weight loss: Improved.  Appreciate dietary input. 3.  Pain: Chronic and unchanged.  Continue OxyContin to 15 mg every 8 hours and continue Percocet every 4 hours as needed.  Appreciate palliative care input.   4.  Left kidney mass: 2.8 x 3.6 x 3.4 mass highly concerning for a second malignancy.  Patient will likely have to undergo partial nephrectomy at the time of his rectal surgery.  He has had consultation with urology at [facility]. 5.  Urinary retention: [facility] input.  Patient is now doing self-catheterization, but states this has improved as well. 6.  Diarrhea: Patient now complains of constipation.  Continue stool softeners daily. 7.  Hypokalemia: Mild.  Monitor. 8.  Anxiety: Chronic and unchanged.  Continue alprazolam as needed.  Appreciate  palliative care input. 9.  Rash: Unchanged despite 6-day steroid taper.  Consider dermatology referral in the future. 10.  Transaminitis: Patient has a mildly elevated ALT.  Monitor and proceed with treatment as above.  Patient expressed understanding and was in agreement with this plan. He also understands that He can call clinic at any time with any questions, concerns, or complaints.   Cancer Staging Rectal cancer [facility]) Staging form: Colon and Rectum, AJCC 8th Edition - Clinical stage from 06/22/2020: Stage IIIB (cT3, cN1a, cM0) - Signed by Lloyd Huger, MD on 06/22/2020 Stage prefix: Initial diagnosis Total positive nodes: 1   Lloyd Huger, MD   10/17/2020 10:42 AM

## 2020-10-17 ENCOUNTER — Inpatient Hospital Stay: Payer: Self-pay

## 2020-10-17 ENCOUNTER — Inpatient Hospital Stay (HOSPITAL_BASED_OUTPATIENT_CLINIC_OR_DEPARTMENT_OTHER): Payer: Self-pay | Admitting: Hospice and Palliative Medicine

## 2020-10-17 ENCOUNTER — Encounter: Payer: Self-pay | Admitting: Oncology

## 2020-10-17 ENCOUNTER — Inpatient Hospital Stay (HOSPITAL_BASED_OUTPATIENT_CLINIC_OR_DEPARTMENT_OTHER): Payer: Self-pay | Admitting: Oncology

## 2020-10-17 VITALS — HR 104

## 2020-10-17 VITALS — BP 119/79 | HR 102 | Temp 98.4°F | Resp 16 | Ht 74.0 in | Wt 170.0 lb

## 2020-10-17 DIAGNOSIS — C20 Malignant neoplasm of rectum: Secondary | ICD-10-CM

## 2020-10-17 DIAGNOSIS — Z515 Encounter for palliative care: Secondary | ICD-10-CM

## 2020-10-17 LAB — COMPREHENSIVE METABOLIC PANEL
ALT: 97 U/L — ABNORMAL HIGH (ref 0–44)
AST: 37 U/L (ref 15–41)
Albumin: 3.4 g/dL — ABNORMAL LOW (ref 3.5–5.0)
Alkaline Phosphatase: 81 U/L (ref 38–126)
Anion gap: 10 (ref 5–15)
BUN: 12 mg/dL (ref 6–20)
CO2: 29 mmol/L (ref 22–32)
Calcium: 9.2 mg/dL (ref 8.9–10.3)
Chloride: 100 mmol/L (ref 98–111)
Creatinine, Ser: 0.88 mg/dL (ref 0.61–1.24)
GFR, Estimated: >60 mL/min (ref >60)
Glucose, Bld: 99 mg/dL (ref 70–99)
Potassium: 3.4 mmol/L — ABNORMAL LOW (ref 3.5–5.1)
Sodium: 139 mmol/L (ref 135–145)
Total Bilirubin: 0.3 mg/dL (ref 0.3–1.2)
Total Protein: 7.1 g/dL (ref 6.5–8.1)

## 2020-10-17 LAB — CBC WITH DIFFERENTIAL/PLATELET
Abs Immature Granulocytes: 0.03 10*3/uL (ref 0.00–0.07)
Basophils Absolute: 0 10*3/uL (ref 0.0–0.1)
Basophils Relative: 0 %
Eosinophils Absolute: 0.1 10*3/uL (ref 0.0–0.5)
Eosinophils Relative: 1 %
HCT: 35.8 % — ABNORMAL LOW (ref 39.0–52.0)
Hemoglobin: 12.2 g/dL — ABNORMAL LOW (ref 13.0–17.0)
Immature Granulocytes: 0 %
Lymphocytes Relative: 15 %
Lymphs Abs: 1.1 10*3/uL (ref 0.7–4.0)
MCH: 33.7 pg (ref 26.0–34.0)
MCHC: 34.1 g/dL (ref 30.0–36.0)
MCV: 98.9 fL (ref 80.0–100.0)
Monocytes Absolute: 0.6 10*3/uL (ref 0.1–1.0)
Monocytes Relative: 9 %
Neutro Abs: 5.3 10*3/uL (ref 1.7–7.7)
Neutrophils Relative %: 75 %
Platelets: 298 10*3/uL (ref 150–400)
RBC: 3.62 MIL/uL — ABNORMAL LOW (ref 4.22–5.81)
RDW: 13.8 % (ref 11.5–15.5)
WBC: 7.2 10*3/uL (ref 4.0–10.5)
nRBC: 0 % (ref 0.0–0.2)

## 2020-10-17 LAB — MAGNESIUM: Magnesium: 2.2 mg/dL (ref 1.7–2.4)

## 2020-10-17 LAB — PHOSPHORUS: Phosphorus: 3.8 mg/dL (ref 2.5–4.6)

## 2020-10-17 MED ORDER — SODIUM CHLORIDE 0.9% FLUSH
10.0000 mL | INTRAVENOUS | Status: DC | PRN
  Administered 2020-10-17: 10 mL via INTRAVENOUS
  Filled 2020-10-17: qty 10

## 2020-10-17 MED ORDER — LEUCOVORIN CALCIUM INJECTION 350 MG
402.0000 mg/m2 | Freq: Once | INTRAVENOUS | Status: AC
  Administered 2020-10-17: 800 mg via INTRAVENOUS
  Filled 2020-10-17: qty 40

## 2020-10-17 MED ORDER — FLUOROURACIL CHEMO INJECTION 2.5 GM/50ML
400.0000 mg/m2 | Freq: Once | INTRAVENOUS | Status: AC
  Administered 2020-10-17: 800 mg via INTRAVENOUS
  Filled 2020-10-17: qty 16

## 2020-10-17 MED ORDER — SODIUM CHLORIDE 0.9 % IV SOLN
2400.0000 mg/m2 | INTRAVENOUS | Status: DC
  Administered 2020-10-17: 4800 mg via INTRAVENOUS
  Filled 2020-10-17: qty 96

## 2020-10-17 MED ORDER — PALONOSETRON HCL INJECTION 0.25 MG/5ML
0.2500 mg | Freq: Once | INTRAVENOUS | Status: AC
  Administered 2020-10-17: 0.25 mg via INTRAVENOUS
  Filled 2020-10-17: qty 5

## 2020-10-17 MED ORDER — DEXTROSE 5 % IV SOLN
Freq: Once | INTRAVENOUS | Status: AC
Start: 2020-10-17 — End: 2020-10-17
  Filled 2020-10-17: qty 250

## 2020-10-17 MED ORDER — OXALIPLATIN CHEMO INJECTION 100 MG/20ML
85.0000 mg/m2 | Freq: Once | INTRAVENOUS | Status: AC
  Administered 2020-10-17: 170 mg via INTRAVENOUS
  Filled 2020-10-17: qty 34

## 2020-10-17 MED ORDER — SODIUM CHLORIDE 0.9 % IV SOLN
10.0000 mg | Freq: Once | INTRAVENOUS | Status: AC
  Administered 2020-10-17: 10 mg via INTRAVENOUS
  Filled 2020-10-17: qty 10

## 2020-10-17 NOTE — Progress Notes (Signed)
ALT 97 & HR 104. Per Dr. Grayland Ormond proceed with treatment.

## 2020-10-17 NOTE — Progress Notes (Signed)
[facility]  Telephone:([phone] [phone]   Name: Malik Lynch Date: 10/17/2020 MRN: [id number]  DOB: 13-Nov-1970  Patient Care Team: Leonard Downing, MD as PCP - General (Family Medicine) [name], RN as Oncology Nurse Navigator    REASON FOR CONSULTATION: Malik Lynch is a 50 y.o. male with multiple medical problems including stage IIIb rectal cancer on XRT and neoadjuvant chemotherapy with capecitabine.  Patient has had pain, weight loss, and anxiety.  He was referred to palliative care to help address goals and manage ongoing symptoms.  SOCIAL HISTORY:     reports that he has been smoking cigarettes. He has been smoking about 1.00 pack per day. He has never used smokeless tobacco. He reports current alcohol use of about 1.0 standard drink of alcohol per week. He reports that he does not use drugs.  Patient is married and lives at home with his wife.  He has 2 sons who live nearby.  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    ADVANCE DIRECTIVES:  Not on file  CODE STATUS:   PAST MEDICAL HISTORY: Past Medical History:  Diagnosis Date  . Family history of breast cancer   . Family history of colon cancer   . Family history of kidney cancer   . Family history of prostate cancer   . Family history of uterine cancer     PAST SURGICAL HISTORY:  Past Surgical History:  Procedure Laterality Date  . IR IMAGING GUIDED PORT INSERTION  09/29/2020    HEMATOLOGY/ONCOLOGY HISTORY:  Oncology History  Rectal cancer ([place])  06/22/2020 Initial Diagnosis   Rectal cancer ([place])   06/22/2020 Cancer Staging   Staging form: Colon and Rectum, AJCC 8th Edition - Clinical stage from 06/22/2020: Stage IIIB (cT3, cN1a, cM0) - Signed by Lloyd Huger, MD on 06/22/2020   06/22/2020 - 06/22/2020 Chemotherapy          Genetic Testing   Negative genetic testing. No  pathogenic variants identified on the [employer] CancerNext-Expanded+RNA panel. The report date is 08/18/2020.   The CancerNext-Expanded + RNAinsight gene panel offered by [employer] and includes sequencing and rearrangement analysis for the following 77 genes: IP, ALK, APC*, ATM*, AXIN2, BAP1, BARD1, BLM, BMPR1A, BRCA1*, BRCA2*, BRIP1*, CDC73, CDH1*,CDK4, CDKN1B, CDKN2A, CHEK2*, CTNNA1, DICER1, FANCC, FH, FLCN, GALNT12, KIF1B, LZTR1, MAX, MEN1, MET, MLH1*, MSH2*, MSH3, MSH6*, MUTYH*, NBN, NF1*, NF2, NTHL1, PALB2*, PHOX2B, PMS2*, POT1, PRKAR1A, PTCH1, PTEN*, RAD51C*, RAD51D*,RB1, RECQL, RET, SDHA, SDHAF2, SDHB, SDHC, SDHD, SMAD4, SMARCA4, SMARCB1, SMARCE1, STK11, SUFU, TMEM127, TP53*,TSC1, TSC2, VHL and XRCC2 (sequencing and deletion/duplication); EGFR, EGLN1, HOXB13, KIT, MITF, PDGFRA, POLD1 and POLE (sequencing only); EPCAM and GREM1 (deletion/duplication only).    10/03/2020 -  Chemotherapy    Patient is on Treatment Plan: COLORECTAL FOLFOX Q14D X 4 MONTHS        ALLERGIES:  has No Known Allergies.  MEDICATIONS:  Current Outpatient Medications  Medication Sig Dispense Refill  . ALPRAZolam (XANAX) 0.5 MG tablet Take 1 tablet (0.5 mg total) by mouth 2 (two) times daily as needed for anxiety. 60 tablet 0  . cyclobenzaprine (FLEXERIL) 10 MG tablet Take 1 tablet (10 mg total) by mouth 3 (three) times daily as needed for muscle spasms. 60 tablet 0  . ibuprofen (ADVIL,MOTRIN) 100 MG tablet Take 800 mg by mouth every 6 (six) hours as needed.    . lactulose (CHRONULAC) 10 GM/15ML solution Take 15-30 mLs (10-20  g total) by mouth 2 (two) times daily as needed for severe constipation. 236 mL 0  . lidocaine-prilocaine (EMLA) cream Apply to affected area once 30 g 3  . loperamide (IMODIUM A-D) 2 MG tablet Take 2 mg by mouth 4 (four) times daily as needed for diarrhea or loose stools. (Patient not taking: Reported on 10/17/2020)    . ondansetron (ZOFRAN) 8 MG tablet Take 1 tablet (8 mg total) by mouth every 8  (eight) hours as needed for nausea or vomiting. 45 tablet 0  . oxyCODONE (OXYCONTIN) 15 mg 12 hr tablet Take 1 tablet (15 mg total) by mouth every 8 (eight) hours. 90 tablet 0  . oxyCODONE-acetaminophen (PERCOCET/ROXICET) 5-325 MG tablet Take 1-2 tablets by mouth every 4 (four) hours as needed for severe pain. 90 tablet 0  . polyethylene glycol (MIRALAX / GLYCOLAX) 17 g packet Take 17 g by mouth daily.    Marland Kitchen senna (SENOKOT) 8.6 MG TABS tablet Take 1 tablet (8.6 mg total) by mouth daily. (Patient not taking: No sig reported) 120 tablet 3  . Sennosides-Docusate Sodium 8.6-50 MG CAPS Take 1 tablet by mouth as needed for constipation. (Patient not taking: No sig reported)    . tamsulosin (FLOMAX) 0.4 MG CAPS capsule Take 0.4 mg by mouth daily.     No current facility-administered medications for this visit.   Facility-Administered Medications Ordered in Other Visits  Medication Dose Route Frequency Provider Last Rate Last Admin  . fluorouracil (ADRUCIL) 4,800 mg in sodium chloride 0.9 % 54 mL chemo infusion  2,400 mg/m2 (Treatment Plan Recorded) Intravenous 1 day or 1 dose Lloyd Huger, MD      . fluorouracil (ADRUCIL) chemo injection 800 mg  400 mg/m2 (Treatment Plan Recorded) Intravenous Once Lloyd Huger, MD      . leucovorin 800 mg in dextrose 5 % 250 mL infusion  402 mg/m2 (Treatment Plan Recorded) Intravenous Once Lloyd Huger, MD      . oxaliplatin (ELOXATIN) 170 mg in dextrose 5 % 500 mL chemo infusion  85 mg/m2 (Treatment Plan Recorded) Intravenous Once Lloyd Huger, MD      . sodium chloride flush (NS) 0.9 % injection 10 mL  10 mL Intravenous PRN Lloyd Huger, MD   10 mL at 10/17/20 3614    VITAL SIGNS: There were no vitals taken for this visit. There were no vitals filed for this visit.  Estimated body mass index is 21.83 kg/m as calculated from the following:   Height as of an earlier encounter on 10/17/20: _0  (1.88 m).   Weight as of an earlier  encounter on 10/17/20: 170 lb (77.1 kg).  LABS: CBC:    Component Value Date/Time   WBC 7.2 10/17/2020 0838   HGB 12.2 (L) 10/17/2020 0838   HCT 35.8 (L) 10/17/2020 0838   PLT 298 10/17/2020 0838   MCV 98.9 10/17/2020 0838   NEUTROABS 5.3 10/17/2020 0838   LYMPHSABS 1.1 10/17/2020 0838   MONOABS 0.6 10/17/2020 0838   EOSABS 0.1 10/17/2020 0838   BASOSABS 0.0 10/17/2020 0838   Comprehensive Metabolic Panel:    Component Value Date/Time   NA 139 10/17/2020 0838   K 3.4 (L) 10/17/2020 0838   CL 100 10/17/2020 0838   CO2 29 10/17/2020 0838   BUN 12 10/17/2020 0838   CREATININE 0.88 10/17/2020 0838   GLUCOSE 99 10/17/2020 0838   CALCIUM 9.2 10/17/2020 0838   AST 37 10/17/2020 0838   ALT 97 (H) 10/17/2020 4315  ALKPHOS 81 10/17/2020 0838   BILITOT 0.3 10/17/2020 0838   PROT 7.1 10/17/2020 0838   ALBUMIN 3.4 (L) 10/17/2020 0838    RADIOGRAPHIC STUDIES: IR IMAGING GUIDED PORT INSERTION  Result Date: 09/29/2020 CLINICAL DATA:  History of rectal carcinoma and need for porta cath for continued chemotherapy. EXAM: IMPLANTED PORT A CATH PLACEMENT WITH ULTRASOUND AND FLUOROSCOPIC GUIDANCE ANESTHESIA/SEDATION: 2.0 mg IV Versed; 100 mcg IV Fentanyl Total Moderate Sedation Time:  32 minutes The patient's level of consciousness and physiologic status were continuously monitored during the procedure by Radiology nursing. FLUOROSCOPY TIME:  18 seconds.  2.0 mGy. PROCEDURE: The procedure, risks, benefits, and alternatives were explained to the patient. Questions regarding the procedure were encouraged and answered. The patient understands and consents to the procedure. A time-out was performed prior to initiating the procedure. Ultrasound was utilized to confirm patency of the right internal jugular vein. The right neck and chest were prepped with chlorhexidine in a sterile fashion, and a sterile drape was applied covering the operative field. Maximum barrier sterile technique with sterile gowns and  gloves were used for the procedure. Local anesthesia was provided with 1% lidocaine. After creating a small venotomy incision, a 21 gauge needle was advanced into the right internal jugular vein under direct, real-time ultrasound guidance. Ultrasound image documentation was performed. After securing guidewire access, an 8 Fr dilator was placed. A J-wire was kinked to measure appropriate catheter length. A subcutaneous port pocket was then created along the upper chest wall utilizing sharp and blunt dissection. Portable cautery was utilized. The pocket was irrigated with sterile saline. A single lumen power injectable port was chosen for placement. The 8 Fr catheter was tunneled from the port pocket site to the venotomy incision. The port was placed in the pocket. External catheter was trimmed to appropriate length based on guidewire measurement. At the venotomy, an 8 Fr peel-away sheath was placed over a guidewire. The catheter was then placed through the sheath and the sheath removed. Final catheter positioning was confirmed and documented with a fluoroscopic spot image. The port was accessed with a needle and aspirated and flushed with heparinized saline. The access needle was removed. The venotomy and port pocket incisions were closed with subcutaneous 3-0 Vicryl and subcuticular 4-0 Monocryl. Dermabond was applied to both incisions. COMPLICATIONS: COMPLICATIONS None FINDINGS: After catheter placement, the tip lies at the cavo-atrial junction. The catheter aspirates normally and is ready for immediate use. IMPRESSION: Placement of single lumen port a cath via right internal jugular vein. The catheter tip lies at the cavo-atrial junction. A power injectable port a cath was placed and is ready for immediate use. Electronically Signed   By: Aletta Edouard M.D.   On: 09/29/2020 09:54    PERFORMANCE STATUS (ECOG) : 1 - Symptomatic but completely ambulatory  Review of Systems Unless otherwise noted, a complete  review of systems is negative.  Physical Exam General: NAD Pulmonary: Unlabored Extremities: no edema, no joint deformities Skin: Erythematous rash to torso  Neurological: Grossly nonfocal  IMPRESSION: Routine follow-up visit.  Patient seen in infusion.  Patient has had somewhat worse pain over the past week or 2 with ambulation.  Discussed increasing the Percocet to 2 tablets every 4 hours as needed for breakthrough pain.  Can consider increasing OxyContin if needed.  Patient continues to have intermittent diarrhea and constipation.  Otherwise, he denies symptomatic changes today.  Patient continues to have the erythematous rash to his chest.  He did not find improvement on prednisone.  Will refer to dermatology given the chronic nature (years) of this rash.  PLAN: -Continue current scope of treatment -Continue alprazolam 0.5 mg twice daily as needed for anxiety -Continue OxyContin 15 mg every 8 hours -Continue Percocet 5-341m (1 to 2 tablets) every 4 hours as needed for breakthrough pain -Continue lactulose 15 to 30 mL twice daily as needed for constipation -Follow-up MyChart visit 1 month   Case and plan discussed with Dr. FGrayland Ormond  Patient expressed understanding and was in agreement with this plan. He also understands that He can call the clinic at any time with any questions, concerns, or complaints.     Time Total: 15 minutes  Visit consisted of counseling and education dealing with the complex and emotionally intense issues of symptom management and palliative care in the setting of serious and potentially life-threatening illness.Greater than 50%  of this time was spent counseling and coordinating care related to the above assessment and plan.  Signed by: JAltha Harm PhD, NP-C

## 2020-10-17 NOTE — Progress Notes (Signed)
HR 104 ok to proceed per MD 

## 2020-10-18 LAB — CEA: CEA: 4.3 ng/mL (ref 0.0–4.7)

## 2020-10-19 ENCOUNTER — Inpatient Hospital Stay: Payer: Self-pay

## 2020-10-19 DIAGNOSIS — C20 Malignant neoplasm of rectum: Secondary | ICD-10-CM

## 2020-10-19 MED ORDER — SODIUM CHLORIDE 0.9% FLUSH
10.0000 mL | INTRAVENOUS | Status: DC | PRN
  Administered 2020-10-19: 10 mL
  Filled 2020-10-19: qty 10

## 2020-10-19 MED ORDER — HEPARIN SOD (PORK) LOCK FLUSH 100 UNIT/ML IV SOLN
500.0000 [IU] | Freq: Once | INTRAVENOUS | Status: AC | PRN
  Administered 2020-10-19: 500 [IU]
  Filled 2020-10-19: qty 5

## 2020-10-19 MED ORDER — HEPARIN SOD (PORK) LOCK FLUSH 100 UNIT/ML IV SOLN
INTRAVENOUS | Status: AC
  Filled 2020-10-19: qty 5

## 2020-10-23 NOTE — Progress Notes (Signed)
Appt cancelled

## 2020-10-24 ENCOUNTER — Other Ambulatory Visit: Payer: Self-pay | Admitting: *Deleted

## 2020-10-24 ENCOUNTER — Other Ambulatory Visit: Payer: Self-pay | Admitting: Oncology

## 2020-10-24 MED ORDER — OXYCODONE HCL ER 15 MG PO T12A: 15.0000 mg | EXTENDED_RELEASE_TABLET | Freq: Three times a day (TID) | ORAL | 0 refills | Status: DC

## 2020-10-24 MED ORDER — LACTULOSE 10 GM/15ML PO SOLN: 10.0000 g | Freq: Two times a day (BID) | ORAL | 0 refills | Status: AC | PRN

## 2020-10-24 MED ORDER — CYCLOBENZAPRINE HCL 10 MG PO TABS: 10.0000 mg | ORAL_TABLET | Freq: Three times a day (TID) | ORAL | 0 refills | Status: DC | PRN

## 2020-10-25 MED ORDER — TAMSULOSIN HCL 0.4 MG PO CAPS: 0.4000 mg | ORAL_CAPSULE | Freq: Every day | ORAL | 2 refills | Status: AC

## 2020-10-27 ENCOUNTER — Other Ambulatory Visit: Payer: Self-pay

## 2020-10-28 NOTE — Progress Notes (Signed)
[place]  [phone] [phone]  ID: Malik Lynch OB: [birth year]  MR#: [id number]  QIO#:[id number]  Patient Care Team: Leonard Downing, MD as PCP - General (Family Medicine) [name], RN as Oncology Nurse Navigator  CHIEF COMPLAINT: Stage IIIb rectal cancer.  INTERVAL HISTORY: Patient returns to clinic today for further evaluation and consideration of cycle 3 of neoadjuvant FOLFOX.  He continues to have pain, but this seems better controlled.  His constipation has resolved, but still has loose stools.  He feels his urination is better as he does not self cath as much as previous.  He continues to have chronic weakness and fatigue. He has no neurologic complaints. He denies any recent fevers or illnesses. He has no chest pain, shortness of breath, cough, or hemoptysis.  He denies any nausea or vomiting.  Patient offers no further specific complaints today.  REVIEW OF SYSTEMS:   Review of Systems  Constitutional: Positive for malaise/fatigue. Negative for fever and weight loss.  Respiratory: Negative.  Negative for cough, hemoptysis and shortness of breath.   Cardiovascular: Negative.  Negative for chest pain and leg swelling.  Gastrointestinal: Positive for abdominal pain. Negative for blood in stool, diarrhea and melena.  Genitourinary: Positive for urgency.  Musculoskeletal: Negative.  Negative for back pain.  Skin: Negative.  Negative for rash.  Neurological: Positive for weakness. Negative for dizziness, focal weakness and headaches.  Psychiatric/Behavioral: Negative.  The patient is not nervous/anxious.     As per HPI. Otherwise, a complete review of systems is negative.  PAST MEDICAL HISTORY: Past Medical History:  Diagnosis Date  . Family history of breast cancer   . Family history of colon cancer   . Family history of kidney cancer   . Family history of prostate cancer   . Family history of uterine cancer      PAST SURGICAL HISTORY: Past Surgical History:  Procedure Laterality Date  . IR IMAGING GUIDED PORT INSERTION  09/29/2020    FAMILY HISTORY: Family History  Problem Relation Age of Onset  . Throat cancer Mother 86  . Lymphoma Maternal Aunt   . Kidney cancer Maternal Uncle   . Breast cancer Paternal Aunt   . Colon cancer Paternal Uncle   . Lung cancer Maternal Grandmother   . Cancer Maternal Grandmother        mouth  . Prostate cancer Maternal Grandfather        dx 78s  . Colon cancer Maternal Grandfather        dx 58s  . Lung cancer Maternal Uncle   . Cancer Maternal Uncle        possibly kidney  . Uterine cancer Paternal Aunt   . Cancer Paternal Aunt        reproductive  . Kidney cancer Paternal Uncle   . Breast cancer Cousin   . Breast cancer Cousin   . Colon cancer Cousin        dx 42s    ADVANCED DIRECTIVES (Y/N):  N  HEALTH MAINTENANCE: Social History   Tobacco Use  . Smoking status: Current Some Day Smoker    Packs/day: 1.00    Types: Cigarettes  . Smokeless tobacco: Never Used  Vaping Use  . Vaping Use: Never used  Substance Use Topics  . Alcohol use: Yes    Alcohol/week: 1.0 standard drink    Types: 1 Cans of beer per week  . Drug use: No     Colonoscopy:  PAP:  Bone density:  Lipid panel:  No Known Allergies  Current Outpatient Medications  Medication Sig Dispense Refill  . ALPRAZolam (XANAX) 0.5 MG tablet TAKE 1 TABLET BY MOUTH 2 TIMES DAILY AS NEEDED FOR ANXIETY. 60 tablet 0  . cyclobenzaprine (FLEXERIL) 10 MG tablet Take 1 tablet (10 mg total) by mouth 3 (three) times daily as needed for muscle spasms. 60 tablet 0  . ibuprofen (ADVIL,MOTRIN) 100 MG tablet Take 800 mg by mouth every 6 (six) hours as needed.    . lactulose (CHRONULAC) 10 GM/15ML solution Take 15-30 mLs (10-20 g total) by mouth 2 (two) times daily as needed for severe constipation. 236 mL 0  . lidocaine-prilocaine (EMLA) cream Apply to affected area once 30 g 3  .  loperamide (IMODIUM A-D) 2 MG tablet Take 2 mg by mouth 4 (four) times daily as needed for diarrhea or loose stools.    . ondansetron (ZOFRAN) 8 MG tablet Take 1 tablet (8 mg total) by mouth every 8 (eight) hours as needed for nausea or vomiting. 45 tablet 0  . senna (SENOKOT) 8.6 MG TABS tablet Take 1 tablet (8.6 mg total) by mouth daily. 120 tablet 3  . Sennosides-Docusate Sodium 8.6-50 MG CAPS Take 1 tablet by mouth as needed for constipation.    . tamsulosin (FLOMAX) 0.4 MG CAPS capsule Take 1 capsule (0.4 mg total) by mouth daily. 90 capsule 2  . oxyCODONE 30 MG 12 hr tablet Take 1 tablet (30 mg total) by mouth every 12 (twelve) hours. 60 tablet 0  . oxyCODONE-acetaminophen (PERCOCET/ROXICET) 5-325 MG tablet TAKE 1-2 TABLETS BY MOUTH EVERY 4 HOURS AS NEEDED FOR SEVERE PAIN. 90 tablet 0  . polyethylene glycol (MIRALAX / GLYCOLAX) 17 g packet Take 17 g by mouth daily.     No current facility-administered medications for this visit.    OBJECTIVE: Vitals:   10/31/20 0924  BP: 130/89  Pulse: (!) 116  Resp: 17  Temp: 97.6 F (36.4 C)  SpO2: 100%     Body mass index is 21.72 kg/m.    ECOG FS:1 - Symptomatic but completely ambulatory  General: Well-developed, well-nourished, no acute distress. Eyes: Pink conjunctiva, anicteric sclera. HEENT: Normocephalic, moist mucous membranes. Lungs: No audible wheezing or coughing. Heart: Regular rate and rhythm. Abdomen: Soft, nontender, no obvious distention. Musculoskeletal: No edema, cyanosis, or clubbing. Neuro: Alert, answering all questions appropriately. Cranial nerves grossly intact. Skin: No rashes or petechiae noted. Psych: Normal affect.  LAB RESULTS:  Lab Results  Component Value Date   NA 136 10/31/2020   K 3.8 10/31/2020   CL 98 10/31/2020   CO2 26 10/31/2020   GLUCOSE 112 (H) 10/31/2020   BUN 11 10/31/2020   CREATININE 0.85 10/31/2020   CALCIUM 9.2 10/31/2020   PROT 7.4 10/31/2020   ALBUMIN 3.4 (L) 10/31/2020   AST 18  10/31/2020   ALT 17 10/31/2020   ALKPHOS 101 10/31/2020   BILITOT 0.6 10/31/2020   GFRNONAA >60 10/31/2020    Lab Results  Component Value Date   WBC 5.7 10/31/2020   NEUTROABS 4.1 10/31/2020   HGB 11.9 (L) 10/31/2020   HCT 34.9 (L) 10/31/2020   MCV 98.9 10/31/2020   PLT 210 10/31/2020     STUDIES: No results found.  ASSESSMENT: Stage IIIb rectal cancer.  PLAN:    1.  Stage IIIb rectal cancer: Pathology and imaging results reviewed independently.  EUS completed at outside facility confirmed stage of disease.  PET scan results from June 28, 2020 reviewed independently  with no obvious malignancy outside of patient's known rectal mass.  Patient completed his neoadjuvant treatment with chemotherapy and XRT on August 17, 2020.  His recent appointment with [facility] GI surgery recommended continued neoadjuvant treatment with FOLFOX prior to surgical resection.  Patient does not wish to use capecitabine any further.  Proceed with cycle 3 of 8 of neoadjuvant FOLFOX today.  Return to clinic in 2 days for pump removal and then in 2 weeks for further evaluation and consideration of cycle 4.  2.  Weight loss: Improved.  Appreciate dietary input. 3.  Pain: Chronic and unchanged.  Continue OxyContin to 15 mg every 8 hours and continue Percocet every 4 hours as needed.  Appreciate palliative care input. 4.  Left kidney mass: 2.8 x 3.6 x 3.4 mass highly concerning for a second malignancy.  Patient will likely have to undergo partial nephrectomy at the time of his rectal surgery.  He has had consultation with [facility]rology at [facility]. 5.  Urinary retention: [place] urology input.  Self-catheterization, but this is improving.  6.  Diarrhea: Improved. 7.  Hypokalemia: Resolved. 8.  Anxiety: Chronic and unchanged.  Continue alprazolam as needed.  Appreciate palliative care input. 9.  Rash: Unchanged despite 6-day steroid taper.  Consider dermatology referral in the future. 10.  Transaminitis:  Resolved.  Patient expressed understanding and was in agreement with this plan. He also understands that He can call clinic at any time with any questions, concerns, or complaints.   Cancer Staging Rectal cancer [facility]) Staging form: Colon and Rectum, AJCC 8th Edition - Clinical stage from 06/22/2020: Stage IIIB (cT3, cN1a, cM0) - Signed by Lloyd Huger, MD on 06/22/2020 Stage prefix: Initial diagnosis Total positive nodes: 1   Lloyd Huger, MD   11/02/2020 6:15 AM

## 2020-10-31 ENCOUNTER — Inpatient Hospital Stay: Payer: Self-pay | Attending: Oncology

## 2020-10-31 ENCOUNTER — Inpatient Hospital Stay (HOSPITAL_BASED_OUTPATIENT_CLINIC_OR_DEPARTMENT_OTHER): Payer: Self-pay | Admitting: Oncology

## 2020-10-31 ENCOUNTER — Other Ambulatory Visit: Payer: Self-pay

## 2020-10-31 ENCOUNTER — Inpatient Hospital Stay (HOSPITAL_BASED_OUTPATIENT_CLINIC_OR_DEPARTMENT_OTHER): Payer: Self-pay | Admitting: Hospice and Palliative Medicine

## 2020-10-31 ENCOUNTER — Inpatient Hospital Stay: Payer: Self-pay

## 2020-10-31 ENCOUNTER — Encounter: Payer: Self-pay | Admitting: Oncology

## 2020-10-31 VITALS — BP 130/89 | HR 116 | Temp 97.6°F | Resp 17 | Ht 74.0 in | Wt 169.2 lb

## 2020-10-31 VITALS — HR 106

## 2020-10-31 DIAGNOSIS — C20 Malignant neoplasm of rectum: Secondary | ICD-10-CM | POA: Insufficient documentation

## 2020-10-31 DIAGNOSIS — G893 Neoplasm related pain (acute) (chronic): Secondary | ICD-10-CM

## 2020-10-31 DIAGNOSIS — Z79899 Other long term (current) drug therapy: Secondary | ICD-10-CM | POA: Insufficient documentation

## 2020-10-31 DIAGNOSIS — Z5111 Encounter for antineoplastic chemotherapy: Secondary | ICD-10-CM | POA: Insufficient documentation

## 2020-10-31 DIAGNOSIS — Z515 Encounter for palliative care: Secondary | ICD-10-CM

## 2020-10-31 LAB — CBC WITH DIFFERENTIAL/PLATELET
Abs Immature Granulocytes: 0.02 10*3/uL (ref 0.00–0.07)
Basophils Absolute: 0 10*3/uL (ref 0.0–0.1)
Basophils Relative: 1 %
Eosinophils Absolute: 0.1 10*3/uL (ref 0.0–0.5)
Eosinophils Relative: 2 %
HCT: 34.9 % — ABNORMAL LOW (ref 39.0–52.0)
Hemoglobin: 11.9 g/dL — ABNORMAL LOW (ref 13.0–17.0)
Immature Granulocytes: 0 %
Lymphocytes Relative: 14 %
Lymphs Abs: 0.8 10*3/uL (ref 0.7–4.0)
MCH: 33.7 pg (ref 26.0–34.0)
MCHC: 34.1 g/dL (ref 30.0–36.0)
MCV: 98.9 fL (ref 80.0–100.0)
Monocytes Absolute: 0.6 10*3/uL (ref 0.1–1.0)
Monocytes Relative: 11 %
Neutro Abs: 4.1 10*3/uL (ref 1.7–7.7)
Neutrophils Relative %: 72 %
Platelets: 210 10*3/uL (ref 150–400)
RBC: 3.53 MIL/uL — ABNORMAL LOW (ref 4.22–5.81)
RDW: 14.2 % (ref 11.5–15.5)
WBC: 5.7 10*3/uL (ref 4.0–10.5)
nRBC: 0 % (ref 0.0–0.2)

## 2020-10-31 LAB — COMPREHENSIVE METABOLIC PANEL
ALT: 17 U/L (ref 0–44)
AST: 18 U/L (ref 15–41)
Albumin: 3.4 g/dL — ABNORMAL LOW (ref 3.5–5.0)
Alkaline Phosphatase: 101 U/L (ref 38–126)
Anion gap: 12 (ref 5–15)
BUN: 11 mg/dL (ref 6–20)
CO2: 26 mmol/L (ref 22–32)
Calcium: 9.2 mg/dL (ref 8.9–10.3)
Chloride: 98 mmol/L (ref 98–111)
Creatinine, Ser: 0.85 mg/dL (ref 0.61–1.24)
GFR, Estimated: >60 mL/min (ref >60)
Glucose, Bld: 112 mg/dL — ABNORMAL HIGH (ref 70–99)
Potassium: 3.8 mmol/L (ref 3.5–5.1)
Sodium: 136 mmol/L (ref 135–145)
Total Bilirubin: 0.6 mg/dL (ref 0.3–1.2)
Total Protein: 7.4 g/dL (ref 6.5–8.1)

## 2020-10-31 LAB — MAGNESIUM: Magnesium: 2.2 mg/dL (ref 1.7–2.4)

## 2020-10-31 MED ORDER — LEUCOVORIN CALCIUM INJECTION 350 MG
402.0000 mg/m2 | Freq: Once | INTRAVENOUS | Status: AC
  Administered 2020-10-31: 800 mg via INTRAVENOUS
  Filled 2020-10-31: qty 40

## 2020-10-31 MED ORDER — DEXTROSE 5 % IV SOLN
Freq: Once | INTRAVENOUS | Status: AC
  Filled 2020-10-31: qty 250

## 2020-10-31 MED ORDER — OXYCODONE-ACETAMINOPHEN 5-325 MG PO TABS: ORAL_TABLET | ORAL | 0 refills | Status: DC

## 2020-10-31 MED ORDER — SODIUM CHLORIDE 0.9 % IV SOLN
2400.0000 mg/m2 | INTRAVENOUS | Status: DC
  Administered 2020-10-31: 4800 mg via INTRAVENOUS
  Filled 2020-10-31: qty 96

## 2020-10-31 MED ORDER — OXYCODONE HCL ER 30 MG PO T12A: 30.0000 mg | EXTENDED_RELEASE_TABLET | Freq: Two times a day (BID) | ORAL | 0 refills | Status: DC

## 2020-10-31 MED ORDER — PALONOSETRON HCL INJECTION 0.25 MG/5ML
0.2500 mg | Freq: Once | INTRAVENOUS | Status: AC
  Administered 2020-10-31: 0.25 mg via INTRAVENOUS
  Filled 2020-10-31: qty 5

## 2020-10-31 MED ORDER — FLUOROURACIL CHEMO INJECTION 2.5 GM/50ML
400.0000 mg/m2 | Freq: Once | INTRAVENOUS | Status: AC
  Administered 2020-10-31: 800 mg via INTRAVENOUS
  Filled 2020-10-31: qty 16

## 2020-10-31 MED ORDER — OXALIPLATIN CHEMO INJECTION 100 MG/20ML
85.0000 mg/m2 | Freq: Once | INTRAVENOUS | Status: AC
  Administered 2020-10-31: 170 mg via INTRAVENOUS
  Filled 2020-10-31: qty 34

## 2020-10-31 MED ORDER — DEXAMETHASONE SODIUM PHOSPHATE 100 MG/10ML IJ SOLN
10.0000 mg | Freq: Once | INTRAMUSCULAR | Status: AC
  Administered 2020-10-31: 10 mg via INTRAVENOUS
  Filled 2020-10-31: qty 10

## 2020-10-31 MED ORDER — DEXTROSE 5 % IV SOLN
Freq: Once | INTRAVENOUS | Status: AC
Start: 2020-10-31 — End: 2020-10-31
  Filled 2020-10-31: qty 250

## 2020-10-31 NOTE — Progress Notes (Signed)
[place]  [phone] [phone]   Name: Malik Lynch Date: 10/31/2020 MRN: [id number]  DOB: [birth year]  Patient Care Team: Leonard Downing, MD as PCP - General (Family Medicine) [name], RN as Oncology Nurse Navigator    REASON FOR CONSULTATION: Malik Lynch is a 50 y.o. male with multiple medical problems including stage IIIb rectal cancer on XRT and neoadjuvant chemotherapy with capecitabine.  Patient has had pain, weight loss, and anxiety.  He was referred to palliative care to help address goals and manage ongoing symptoms.  SOCIAL HISTORY:     reports that he has been smoking cigarettes. He has been smoking about 1.00 pack per day. He has never used smokeless tobacco. He reports current alcohol use of about 1.0 standard drink of alcohol per week. He reports that he does not use drugs.  Patient is married and lives at home with his wife.  He has 2 sons who live nearby.  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    ADVANCE DIRECTIVES:  Not on file  CODE STATUS:   PAST MEDICAL HISTORY: Past Medical History:  Diagnosis Date  . Family history of breast cancer   . Family history of colon cancer   . Family history of kidney cancer   . Family history of prostate cancer   . Family history of uterine cancer     PAST SURGICAL HISTORY:  Past Surgical History:  Procedure Laterality Date  . IR IMAGING GUIDED PORT INSERTION  09/29/2020    HEMATOLOGY/ONCOLOGY HISTORY:  Oncology History  Rectal cancer ([place])  06/22/2020 Initial Diagnosis   Rectal cancer ([place])   06/22/2020 Cancer Staging   Staging form: Colon and Rectum, AJCC 8th Edition - Clinical stage from 06/22/2020: Stage IIIB (cT3, cN1a, cM0) - Signed by Lloyd Huger, MD on 06/22/2020   06/22/2020 - 06/22/2020 Chemotherapy          Genetic Testing   Negative genetic testing. No  pathogenic variants identified on the [employer] CancerNext-Expanded+RNA panel. The report date is 08/18/2020.   The CancerNext-Expanded + RNAinsight gene panel offered by [employer] and includes sequencing and rearrangement analysis for the following 77 genes: IP, ALK, APC*, ATM*, AXIN2, BAP1, BARD1, BLM, BMPR1A, BRCA1*, BRCA2*, BRIP1*, CDC73, CDH1*,CDK4, CDKN1B, CDKN2A, CHEK2*, CTNNA1, DICER1, FANCC, FH, FLCN, GALNT12, KIF1B, LZTR1, MAX, MEN1, MET, MLH1*, MSH2*, MSH3, MSH6*, MUTYH*, NBN, NF1*, NF2, NTHL1, PALB2*, PHOX2B, PMS2*, POT1, PRKAR1A, PTCH1, PTEN*, RAD51C*, RAD51D*,RB1, RECQL, RET, SDHA, SDHAF2, SDHB, SDHC, SDHD, SMAD4, SMARCA4, SMARCB1, SMARCE1, STK11, SUFU, TMEM127, TP53*,TSC1, TSC2, VHL and XRCC2 (sequencing and deletion/duplication); EGFR, EGLN1, HOXB13, KIT, MITF, PDGFRA, POLD1 and POLE (sequencing only); EPCAM and GREM1 (deletion/duplication only).    10/03/2020 -  Chemotherapy    Patient is on Treatment Plan: COLORECTAL FOLFOX Q14D X 4 MONTHS        ALLERGIES:  has No Known Allergies.  MEDICATIONS:  Current Outpatient Medications  Medication Sig Dispense Refill  . ALPRAZolam (XANAX) 0.5 MG tablet TAKE 1 TABLET BY MOUTH 2 TIMES DAILY AS NEEDED FOR ANXIETY. 60 tablet 0  . cyclobenzaprine (FLEXERIL) 10 MG tablet Take 1 tablet (10 mg total) by mouth 3 (three) times daily as needed for muscle spasms. 60 tablet 0  . ibuprofen (ADVIL,MOTRIN) 100 MG tablet Take 800 mg by mouth every 6 (six) hours as needed.    . lactulose (CHRONULAC) 10 GM/15ML solution Take 15-30 mLs (10-20 g total) by mouth  2 (two) times daily as needed for severe constipation. 236 mL 0  . lidocaine-prilocaine (EMLA) cream Apply to affected area once 30 g 3  . loperamide (IMODIUM A-D) 2 MG tablet Take 2 mg by mouth 4 (four) times daily as needed for diarrhea or loose stools.    . ondansetron (ZOFRAN) 8 MG tablet Take 1 tablet (8 mg total) by mouth every 8 (eight) hours as needed for nausea or vomiting. 45 tablet 0  .  oxyCODONE-acetaminophen (PERCOCET/ROXICET) 5-325 MG tablet TAKE 1-2 TABLETS BY MOUTH EVERY 4 HOURS AS NEEDED FOR SEVERE PAIN. 90 tablet 0  . polyethylene glycol (MIRALAX / GLYCOLAX) 17 g packet Take 17 g by mouth daily.    Marland Kitchen senna (SENOKOT) 8.6 MG TABS tablet Take 1 tablet (8.6 mg total) by mouth daily. 120 tablet 3  . Sennosides-Docusate Sodium 8.6-50 MG CAPS Take 1 tablet by mouth as needed for constipation.    . tamsulosin (FLOMAX) 0.4 MG CAPS capsule Take 1 capsule (0.4 mg total) by mouth daily. 90 capsule 2   No current facility-administered medications for this visit.   Facility-Administered Medications Ordered in Other Visits  Medication Dose Route Frequency Provider Last Rate Last Admin  . fluorouracil (ADRUCIL) 4,800 mg in sodium chloride 0.9 % 54 mL chemo infusion  2,400 mg/m2 (Treatment Plan Recorded) Intravenous 1 day or 1 dose Lloyd Huger, MD      . fluorouracil (ADRUCIL) chemo injection 800 mg  400 mg/m2 (Treatment Plan Recorded) Intravenous Once Lloyd Huger, MD      . leucovorin 800 mg in dextrose 5 % 250 mL infusion  402 mg/m2 (Treatment Plan Recorded) Intravenous Once Lloyd Huger, MD 145 mL/hr at 10/31/20 1127 800 mg at 10/31/20 1127  . oxaliplatin (ELOXATIN) 170 mg in dextrose 5 % 500 mL chemo infusion  85 mg/m2 (Treatment Plan Recorded) Intravenous Once Lloyd Huger, MD 267 mL/hr at 10/31/20 1128 170 mg at 10/31/20 1128    VITAL SIGNS: There were no vitals taken for this visit. There were no vitals filed for this visit.  Estimated body mass index is 21.72 kg/m as calculated from the following:   Height as of an earlier encounter on 10/31/20: _0  (1.88 m).   Weight as of an earlier encounter on 10/31/20: 169 lb 3.2 oz (76.7 kg).  LABS: CBC:    Component Value Date/Time   WBC 5.7 10/31/2020 0856   HGB 11.9 (L) 10/31/2020 0856   HCT 34.9 (L) 10/31/2020 0856   PLT 210 10/31/2020 0856   MCV 98.9 10/31/2020 0856   NEUTROABS 4.1 10/31/2020  0856   LYMPHSABS 0.8 10/31/2020 0856   MONOABS 0.6 10/31/2020 0856   EOSABS 0.1 10/31/2020 0856   BASOSABS 0.0 10/31/2020 0856   Comprehensive Metabolic Panel:    Component Value Date/Time   NA 136 10/31/2020 0856   K 3.8 10/31/2020 0856   CL 98 10/31/2020 0856   CO2 26 10/31/2020 0856   BUN 11 10/31/2020 0856   CREATININE 0.85 10/31/2020 0856   GLUCOSE 112 (H) 10/31/2020 0856   CALCIUM 9.2 10/31/2020 0856   AST 18 10/31/2020 0856   ALT 17 10/31/2020 0856   ALKPHOS 101 10/31/2020 0856   BILITOT 0.6 10/31/2020 0856   PROT 7.4 10/31/2020 0856   ALBUMIN 3.4 (L) 10/31/2020 0856    RADIOGRAPHIC STUDIES: No results found.  PERFORMANCE STATUS (ECOG) : 1 - Symptomatic but completely ambulatory  Review of Systems Unless otherwise noted, a complete review of systems is  negative.  Physical Exam General: NAD Pulmonary: Unlabored Extremities: no edema, no joint deformities Skin: Erythematous rash to torso  Neurological: Grossly nonfocal  IMPRESSION: Routine follow-up visit.  Patient seen in infusion.  Patient says that pain has been persistent and severe over the past several weeks.  He reports having to take Percocet more frequently for breakthrough pain.  He also says that he feels the OxyContin is no longer controlling the pain adequately and he is having to watch the clock for the next dose.  We will plan to increase OxyContin to 30 mg every 12 hours and refill Percocet today.  Otherwise, patient feels he is doing slightly better.  He says his appetite has improved.  Weight is stable.  He is trying to be more active.  He is having to rely on self-catheterization less frequently.  PLAN: -Continue current scope of treatment -Continue alprazolam 0.5 mg twice daily as needed for anxiety -Increase OxyContin 30 mg every 12 hours, #60 -Refill Percocet 5-355m (1 to 2 tablets) every 4 hours as needed for breakthrough pain, #90 -Continue lactulose 15 to 30 mL twice daily as needed  for constipation -Follow-up MyChart visit 1 month   Case and plan discussed with Dr. FGrayland Ormond  Patient expressed understanding and was in agreement with this plan. He also understands that He can call the clinic at any time with any questions, concerns, or complaints.     Time Total: 15 minutes  Visit consisted of counseling and education dealing with the complex and emotionally intense issues of symptom management and palliative care in the setting of serious and potentially life-threatening illness.Greater than 50%  of this time was spent counseling and coordinating care related to the above assessment and plan.  Signed by: JAltha Harm PhD, NP-C

## 2020-10-31 NOTE — Patient Instructions (Signed)
[facility]  Discharge Instructions: Thank you for choosing [facility] to provide your oncology and hematology care.  If you have a lab appointment with the [facility], please go directly to the [facility] and check in at the registration area.  Wear comfortable clothing and clothing appropriate for easy access to any Portacath or PICC line.   We strive to give you quality time with your provider. You may need to reschedule your appointment if you arrive late (15 or more minutes).  Arriving late affects you and other patients whose appointments are after yours.  Also, if you miss three or more appointments without notifying the office, you may be dismissed from the clinic at the provider's discretion.      For prescription refill requests, have your pharmacy contact our office and allow 72 hours for refills to be completed.    Today you received the following chemotherapy and/or immunotherapy agents FOLFOX      To help prevent nausea and vomiting after your treatment, we encourage you to take your nausea medication as directed.  BELOW ARE SYMPTOMS THAT SHOULD BE REPORTED IMMEDIATELY: . *FEVER GREATER THAN 100.4 F (38 C) OR HIGHER . *CHILLS OR SWEATING . *NAUSEA AND VOMITING THAT IS NOT CONTROLLED WITH YOUR NAUSEA MEDICATION . *UNUSUAL SHORTNESS OF BREATH . *UNUSUAL BRUISING OR BLEEDING . *URINARY PROBLEMS (pain or burning when urinating, or frequent urination) . *BOWEL PROBLEMS (unusual diarrhea, constipation, pain near the anus) . TENDERNESS IN MOUTH AND THROAT WITH OR WITHOUT PRESENCE OF ULCERS (sore throat, sores in mouth, or a toothache) . UNUSUAL RASH, SWELLING OR PAIN  . UNUSUAL VAGINAL DISCHARGE OR ITCHING   Items with * indicate a potential emergency and should be followed up as soon as possible or go to the Emergency Department if any problems should occur.  Please show the CHEMOTHERAPY ALERT CARD or IMMUNOTHERAPY ALERT  CARD at check-in to the Emergency Department and triage nurse.  Should you have questions after your visit or need to cancel or reschedule your appointment, please contact [facility]  [phone] and follow the prompts.  Office hours are 8:00 a.m. to 4:30 p.m. Monday - Friday. Please note that voicemails left after 4:00 p.m. may not be returned until the following business day.  We are closed weekends and major holidays. You have access to a nurse at all times for urgent questions. Please call the main number to the clinic [phone] and follow the prompts.  For any non-urgent questions, you may also contact your provider using MyChart. We now offer e-Visits for anyone 42 and older to request care online for non-urgent symptoms. For details visit [email or web address].   Also download the MyChart app! Go to the app store, search "MyChart", open the app, select [facility], and log in with your MyChart username and password.  Due to Covid, a mask is required upon entering the hospital/clinic. If you do not have a mask, one will be given to you upon arrival. For doctor visits, patients may have 1 support person aged 75 or older with them. For treatment visits, patients cannot have anyone with them due to current Covid guidelines and our immunocompromised population. Fluorouracil, 5-FU injection What is this medicine? FLUOROURACIL, 5-FU (flure oh YOOR a sil) is a chemotherapy drug. It slows the growth of cancer cells. This medicine is used to treat many types of cancer like breast cancer, colon or rectal cancer, pancreatic cancer, and stomach cancer.  This medicine may be used for other purposes; ask your health care provider or pharmacist if you have questions. COMMON BRAND NAME(S): Adrucil What should I tell my health care provider before I take this medicine? They need to know if you have any of these conditions:  blood disorders  dihydropyrimidine  dehydrogenase (DPD) deficiency  infection (especially a virus infection such as chickenpox, cold sores, or herpes)  kidney disease  liver disease  malnourished, poor nutrition  recent or ongoing radiation therapy  an unusual or allergic reaction to fluorouracil, other chemotherapy, other medicines, foods, dyes, or preservatives  pregnant or trying to get pregnant  breast-feeding How should I use this medicine? This drug is given as an infusion or injection into a vein. It is administered in a hospital or clinic by a specially trained health care professional. Talk to your pediatrician regarding the use of this medicine in children. Special care may be needed. Overdosage: If you think you have taken too much of this medicine contact a poison control center or emergency room at once. NOTE: This medicine is only for you. Do not share this medicine with others. What if I miss a dose? It is important not to miss your dose. Call your doctor or health care professional if you are unable to keep an appointment. What may interact with this medicine? Do not take this medicine with any of the following medications:  live virus vaccines This medicine may also interact with the following medications:  medicines that treat or prevent blood clots like warfarin, enoxaparin, and dalteparin This list may not describe all possible interactions. Give your health care provider a list of all the medicines, herbs, non-prescription drugs, or dietary supplements you use. Also tell them if you smoke, drink alcohol, or use illegal drugs. Some items may interact with your medicine. What should I watch for while using this medicine? Visit your doctor for checks on your progress. This drug may make you feel generally unwell. This is not uncommon, as chemotherapy can affect healthy cells as well as cancer cells. Report any side effects. Continue your course of treatment even though you feel ill unless your doctor  tells you to stop. In some cases, you may be given additional medicines to help with side effects. Follow all directions for their use. Call your doctor or health care professional for advice if you get a fever, chills or sore throat, or other symptoms of a cold or flu. Do not treat yourself. This drug decreases your body's ability to fight infections. Try to avoid being around people who are sick. This medicine may increase your risk to bruise or bleed. Call your doctor or health care professional if you notice any unusual bleeding. Be careful brushing and flossing your teeth or using a toothpick because you may get an infection or bleed more easily. If you have any dental work done, tell your dentist you are receiving this medicine. Avoid taking products that contain aspirin, acetaminophen, ibuprofen, naproxen, or ketoprofen unless instructed by your doctor. These medicines may hide a fever. Do not become pregnant while taking this medicine. Women should inform their doctor if they wish to become pregnant or think they might be pregnant. There is a potential for serious side effects to an unborn child. Talk to your health care professional or pharmacist for more information. Do not breast-feed an infant while taking this medicine. Men should inform their doctor if they wish to father a child. This medicine may lower sperm counts.  Do not treat diarrhea with over the counter products. Contact your doctor if you have diarrhea that lasts more than 2 days or if it is severe and watery. This medicine can make you more sensitive to the sun. Keep out of the sun. If you cannot avoid being in the sun, wear protective clothing and use sunscreen. Do not use sun lamps or tanning beds/booths. What side effects may I notice from receiving this medicine? Side effects that you should report to your doctor or health care professional as soon as possible:  allergic reactions like skin rash, itching or hives, swelling of  the face, lips, or tongue  low blood counts - this medicine may decrease the number of white blood cells, red blood cells and platelets. You may be at increased risk for infections and bleeding.  signs of infection - fever or chills, cough, sore throat, pain or difficulty passing urine  signs of decreased platelets or bleeding - bruising, pinpoint red spots on the skin, black, tarry stools, blood in the urine  signs of decreased red blood cells - unusually weak or tired, fainting spells, lightheadedness  breathing problems  changes in vision  chest pain  mouth sores  nausea and vomiting  pain, swelling, redness at site where injected  pain, tingling, numbness in the hands or feet  redness, swelling, or sores on hands or feet  stomach pain  unusual bleeding Side effects that usually do not require medical attention (report to your doctor or health care professional if they continue or are bothersome):  changes in finger or toe nails  diarrhea  dry or itchy skin  hair loss  headache  loss of appetite  sensitivity of eyes to the light  stomach upset  unusually teary eyes This list may not describe all possible side effects. Call your doctor for medical advice about side effects. You may report side effects to FDA at 1-800-FDA-1088. Where should I keep my medicine? This drug is given in a hospital or clinic and will not be stored at home. NOTE: This sheet is a summary. It may not cover all possible information. If you have questions about this medicine, talk to your doctor, pharmacist, or health care provider.  2021 Elsevier/Gold Standard (2019-05-18 15:00:03) Leucovorin injection What is this medicine? LEUCOVORIN (loo koe VOR in) is used to prevent or treat the harmful effects of some medicines. This medicine is used to treat anemia caused by a low amount of folic acid in the body. It is also used with 5-fluorouracil (5-FU) to treat colon cancer. This medicine  may be used for other purposes; ask your health care provider or pharmacist if you have questions. What should I tell my health care provider before I take this medicine? They need to know if you have any of these conditions:  anemia from low levels of vitamin B-12 in the blood  an unusual or allergic reaction to leucovorin, folic acid, other medicines, foods, dyes, or preservatives  pregnant or trying to get pregnant  breast-feeding How should I use this medicine? This medicine is for injection into a muscle or into a vein. It is given by a health care professional in a hospital or clinic setting. Talk to your pediatrician regarding the use of this medicine in children. Special care may be needed. Overdosage: If you think you have taken too much of this medicine contact a poison control center or emergency room at once. NOTE: This medicine is only for you. Do not share this  medicine with others. What if I miss a dose? This does not apply. What may interact with this medicine?  capecitabine  fluorouracil  phenobarbital  phenytoin  primidone  trimethoprim-sulfamethoxazole This list may not describe all possible interactions. Give your health care provider a list of all the medicines, herbs, non-prescription drugs, or dietary supplements you use. Also tell them if you smoke, drink alcohol, or use illegal drugs. Some items may interact with your medicine. What should I watch for while using this medicine? Your condition will be monitored carefully while you are receiving this medicine. This medicine may increase the side effects of 5-fluorouracil, 5-FU. Tell your doctor or health care professional if you have diarrhea or mouth sores that do not get better or that get worse. What side effects may I notice from receiving this medicine? Side effects that you should report to your doctor or health care professional as soon as possible:  allergic reactions like skin rash, itching or  hives, swelling of the face, lips, or tongue  breathing problems  fever, infection  mouth sores  unusual bleeding or bruising  unusually weak or tired Side effects that usually do not require medical attention (report to your doctor or health care professional if they continue or are bothersome):  constipation or diarrhea  loss of appetite  nausea, vomiting This list may not describe all possible side effects. Call your doctor for medical advice about side effects. You may report side effects to FDA at 1-800-FDA-1088. Where should I keep my medicine? This drug is given in a hospital or clinic and will not be stored at home. NOTE: This sheet is a summary. It may not cover all possible information. If you have questions about this medicine, talk to your doctor, pharmacist, or health care provider.  2021 Elsevier/Gold Standard (2007-12-22 16:50:29) Oxaliplatin Injection What is this medicine? OXALIPLATIN (ox AL i PLA tin) is a chemotherapy drug. It targets fast dividing cells, like cancer cells, and causes these cells to die. This medicine is used to treat cancers of the colon and rectum, and many other cancers. This medicine may be used for other purposes; ask your health care provider or pharmacist if you have questions. COMMON BRAND NAME(S): Eloxatin What should I tell my health care provider before I take this medicine? They need to know if you have any of these conditions:  heart disease  history of irregular heartbeat  liver disease  low blood counts, like white cells, platelets, or red blood cells  lung or breathing disease, like asthma  take medicines that treat or prevent blood clots  tingling of the fingers or toes, or other nerve disorder  an unusual or allergic reaction to oxaliplatin, other chemotherapy, other medicines, foods, dyes, or preservatives  pregnant or trying to get pregnant  breast-feeding How should I use this medicine? This drug is given as  an infusion into a vein. It is administered in a hospital or clinic by a specially trained health care professional. Talk to your pediatrician regarding the use of this medicine in children. Special care may be needed. Overdosage: If you think you have taken too much of this medicine contact a poison control center or emergency room at once. NOTE: This medicine is only for you. Do not share this medicine with others. What if I miss a dose? It is important not to miss a dose. Call your doctor or health care professional if you are unable to keep an appointment. What may interact with this medicine?  Do not take this medicine with any of the following medications:  cisapride  dronedarone  pimozide  thioridazine This medicine may also interact with the following medications:  aspirin and aspirin-like medicines  certain medicines that treat or prevent blood clots like warfarin, apixaban, dabigatran, and rivaroxaban  cisplatin  cyclosporine  diuretics  medicines for infection like acyclovir, adefovir, amphotericin B, bacitracin, cidofovir, foscarnet, ganciclovir, gentamicin, pentamidine, vancomycin  NSAIDs, medicines for pain and inflammation, like ibuprofen or naproxen  other medicines that prolong the QT interval (an abnormal heart rhythm)  pamidronate  zoledronic acid This list may not describe all possible interactions. Give your health care provider a list of all the medicines, herbs, non-prescription drugs, or dietary supplements you use. Also tell them if you smoke, drink alcohol, or use illegal drugs. Some items may interact with your medicine. What should I watch for while using this medicine? Your condition will be monitored carefully while you are receiving this medicine. You may need blood work done while you are taking this medicine. This medicine may make you feel generally unwell. This is not uncommon as chemotherapy can affect healthy cells as well as cancer cells.  Report any side effects. Continue your course of treatment even though you feel ill unless your healthcare professional tells you to stop. This medicine can make you more sensitive to cold. Do not drink cold drinks or use ice. Cover exposed skin before coming in contact with cold temperatures or cold objects. When out in cold weather wear warm clothing and cover your mouth and nose to warm the air that goes into your lungs. Tell your doctor if you get sensitive to the cold. Do not become pregnant while taking this medicine or for 9 months after stopping it. Women should inform their health care professional if they wish to become pregnant or think they might be pregnant. Men should not father a child while taking this medicine and for 6 months after stopping it. There is potential for serious side effects to an unborn child. Talk to your health care professional for more information. Do not breast-feed a child while taking this medicine or for 3 months after stopping it. This medicine has caused ovarian failure in some women. This medicine may make it more difficult to get pregnant. Talk to your health care professional if you are concerned about your fertility. This medicine has caused decreased sperm counts in some men. This may make it more difficult to father a child. Talk to your health care professional if you are concerned about your fertility. This medicine may increase your risk of getting an infection. Call your health care professional for advice if you get a fever, chills, or sore throat, or other symptoms of a cold or flu. Do not treat yourself. Try to avoid being around people who are sick. Avoid taking medicines that contain aspirin, acetaminophen, ibuprofen, naproxen, or ketoprofen unless instructed by your health care professional. These medicines may hide a fever. Be careful brushing or flossing your teeth or using a toothpick because you may get an infection or bleed more easily. If you  have any dental work done, tell your dentist you are receiving this medicine. What side effects may I notice from receiving this medicine? Side effects that you should report to your doctor or health care professional as soon as possible:  allergic reactions like skin rash, itching or hives, swelling of the face, lips, or tongue  breathing problems  cough  low blood counts - this  medicine may decrease the number of white blood cells, red blood cells, and platelets. You may be at increased risk for infections and bleeding  nausea, vomiting  pain, redness, or irritation at site where injected  pain, tingling, numbness in the hands or feet  signs and symptoms of bleeding such as bloody or black, tarry stools; red or dark brown urine; spitting up blood or brown material that looks like coffee grounds; red spots on the skin; unusual bruising or bleeding from the eyes, gums, or nose  signs and symptoms of a dangerous change in heartbeat or heart rhythm like chest pain; dizziness; fast, irregular heartbeat; palpitations; feeling faint or lightheaded; falls  signs and symptoms of infection like fever; chills; cough; sore throat; pain or trouble passing urine  signs and symptoms of liver injury like dark yellow or brown urine; general ill feeling or flu-like symptoms; light-colored stools; loss of appetite; nausea; right upper belly pain; unusually weak or tired; yellowing of the eyes or skin  signs and symptoms of low red blood cells or anemia such as unusually weak or tired; feeling faint or lightheaded; falls  signs and symptoms of muscle injury like dark urine; trouble passing urine or change in the amount of urine; unusually weak or tired; muscle pain; back pain Side effects that usually do not require medical attention (report to your doctor or health care professional if they continue or are bothersome):  changes in taste  diarrhea  gas  hair loss  loss of appetite  mouth  sores This list may not describe all possible side effects. Call your doctor for medical advice about side effects. You may report side effects to FDA at 1-800-FDA-1088. Where should I keep my medicine? This drug is given in a hospital or clinic and will not be stored at home. NOTE: This sheet is a summary. It may not cover all possible information. If you have questions about this medicine, talk to your doctor, pharmacist, or health care provider.  2021 Elsevier/Gold Standard (2018-11-04 12:20:35)

## 2020-10-31 NOTE — Progress Notes (Signed)
HR 106. Per Dr Grayland Ormond ok to proceed with treatment

## 2020-10-31 NOTE — Progress Notes (Signed)
Patient complains of pain level of 6 today .. sitting and walking is causing extreme pain in rectal area  ... pt took pain med this morning . He is out of one pain medicine he is out of 8 hr release oxy . Had problems sleep tossed and turned till 1:00 no solid sleep

## 2020-10-31 NOTE — Progress Notes (Signed)
Nutrition Follow-up:  Patient with rectal cancer.  Patient has completed radiation.  Patient receiving folfox chemotherapy.  Planning surgery at [facility] 7/19.    Met with patient during infusion.  Patient reports that his appetite is been better than the first round of chemotherapy.  Less nausea and diarrhea this time.  Bowel movements have been more solid.  Usually eats cereal early am then lunch that wife prepares and dinner. Snacks on pudding, fruit cups, ice cream.  Small frequent meals work better for him.      Medications: reviewed  Labs: reviewed  Anthropometrics:   Weight 169 lb 3.2 oz today  170 lb on 4/19 172 lb on 3/24 175 lb on 3/3 170 lb on 2/15   NUTRITION DIAGNOSIS: Inadequate oral intake stable   INTERVENTION:  Reviewed ways to add calories and protein in diet.   Continue small frequent meals Continue bowel regimen medications as needed    MONITORING, EVALUATION, GOAL: weight trends, intake   NEXT VISIT: Tuesday, May 17 during infusion  Avleen Bordwell B. Zenia Resides, [place], [place] Registered Dietitian [phone] (mobile)

## 2020-11-02 ENCOUNTER — Inpatient Hospital Stay: Payer: Self-pay

## 2020-11-02 ENCOUNTER — Other Ambulatory Visit: Payer: Self-pay

## 2020-11-02 DIAGNOSIS — C20 Malignant neoplasm of rectum: Secondary | ICD-10-CM

## 2020-11-02 MED ORDER — HEPARIN SOD (PORK) LOCK FLUSH 100 UNIT/ML IV SOLN
INTRAVENOUS | Status: AC
  Filled 2020-11-02: qty 5

## 2020-11-02 MED ORDER — HEPARIN SOD (PORK) LOCK FLUSH 100 UNIT/ML IV SOLN
500.0000 [IU] | Freq: Once | INTRAVENOUS | Status: AC | PRN
  Administered 2020-11-02: 500 [IU]
  Filled 2020-11-02: qty 5

## 2020-11-09 NOTE — Progress Notes (Signed)
[name]  [phone] [phone]  ID: Malik Lynch OB: July 30, 1970  MR#: [id number]  W[facility]#:[id number]  Patient Care Team: Leonard Downing, MD as PCP - General (Family Medicine) [name], RN as Oncology Nurse Navigator  CHIEF COMPLAINT: Stage IIIb rectal cancer.  INTERVAL HISTORY: Patient returns to clinic today for further evaluation and consideration of cycle 4 of neoadjuvant FOLFOX.  His pain continues to be better controlled.  He continues to have loose stools, but denies diarrhea.  His urination has significantly improved and he minimally has to self catheterization at this point.  He continues to have chronic weakness and fatigue. He has no neurologic complaints. He denies any recent fevers or illnesses. He has no chest pain, shortness of breath, cough, or hemoptysis.  He denies any nausea or vomiting.  Patient offers no further specific complaints today.  REVIEW OF SYSTEMS:   Review of Systems  Constitutional: Positive for malaise/fatigue. Negative for fever and weight loss.  Respiratory: Negative.  Negative for cough, hemoptysis and shortness of breath.   Cardiovascular: Negative.  Negative for chest pain and leg swelling.  Gastrointestinal: Positive for abdominal pain. Negative for blood in stool, diarrhea and melena.  Genitourinary: Negative.  Negative for urgency.  Musculoskeletal: Negative.  Negative for back pain.  Skin: Negative.  Negative for rash.  Neurological: Positive for weakness. Negative for dizziness, focal weakness and headaches.  Psychiatric/Behavioral: Negative.  The patient is not nervous/anxious.     As per HPI. Otherwise, a complete review of systems is negative.  PAST MEDICAL HISTORY: Past Medical History:  Diagnosis Date  . Family history of breast cancer   . Family history of colon cancer   . Family history of kidney cancer   . Family history of prostate cancer   . Family history of uterine  cancer     PAST SURGICAL HISTORY: Past Surgical History:  Procedure Laterality Date  . IR IMAGING GUIDED PORT INSERTION  09/29/2020    FAMILY HISTORY: Family History  Problem Relation Age of Onset  . Throat cancer Mother 90  . Lymphoma Maternal Aunt   . Kidney cancer Maternal Uncle   . Breast cancer Paternal Aunt   . Colon cancer Paternal Uncle   . Lung cancer Maternal Grandmother   . Cancer Maternal Grandmother        mouth  . Prostate cancer Maternal Grandfather        dx 54s  . Colon cancer Maternal Grandfather        dx 82s  . Lung cancer Maternal Uncle   . Cancer Maternal Uncle        possibly kidney  . Uterine cancer Paternal Aunt   . Cancer Paternal Aunt        reproductive  . Kidney cancer Paternal Uncle   . Breast cancer Cousin   . Breast cancer Cousin   . Colon cancer Cousin        dx 30s    ADVANCED DIRECTIVES (Y/N):  N  HEALTH MAINTENANCE: Social History   Tobacco Use  . Smoking status: Current Some Day Smoker    Packs/day: 1.00    Types: Cigarettes  . Smokeless tobacco: Never Used  Vaping Use  . Vaping Use: Never used  Substance Use Topics  . Alcohol use: Yes    Alcohol/week: 1.0 standard drink    Types: 1 Cans of beer per week  . Drug use: No     Colonoscopy:  PAP:  Bone density:  Lipid panel:  No Known Allergies  Current Outpatient Medications  Medication Sig Dispense Refill  . ALPRAZolam (XANAX) 0.5 MG tablet TAKE 1 TABLET BY MOUTH 2 TIMES DAILY AS NEEDED FOR ANXIETY. 60 tablet 0  . cyclobenzaprine (FLEXERIL) 10 MG tablet Take 1 tablet (10 mg total) by mouth 3 (three) times daily as needed for muscle spasms. 60 tablet 0  . ibuprofen (ADVIL,MOTRIN) 100 MG tablet Take 800 mg by mouth every 6 (six) hours as needed.    . lactulose (CHRONULAC) 10 GM/15ML solution Take 15-30 mLs (10-20 g total) by mouth 2 (two) times daily as needed for severe constipation. 236 mL 0  . lidocaine-prilocaine (EMLA) cream Apply to affected area once 30 g 3  .  loperamide (IMODIUM A-D) 2 MG tablet Take 2 mg by mouth 4 (four) times daily as needed for diarrhea or loose stools.    Marland Kitchen morphine (MS CONTIN) 15 MG 12 hr tablet Take 1 tablet (15 mg total) by mouth every 12 (twelve) hours. 30 tablet 0  . ondansetron (ZOFRAN) 8 MG tablet Take 1 tablet (8 mg total) by mouth every 8 (eight) hours as needed for nausea or vomiting. 45 tablet 0  . oxyCODONE-acetaminophen (PERCOCET/ROXICET) 5-325 MG tablet TAKE 1-2 TABLETS BY MOUTH EVERY 4 HOURS AS NEEDED FOR SEVERE PAIN. 90 tablet 0  . polyethylene glycol (MIRALAX / GLYCOLAX) 17 g packet Take 17 g by mouth daily.    . prochlorperazine (COMPAZINE) 10 MG tablet TAKE ONE TABLET BY MOUTH EVERY 6 HOURS AS NEEDED FOR NAUSEA / VOMITING 60 tablet 1  . senna (SENOKOT) 8.6 MG TABS tablet Take 1 tablet (8.6 mg total) by mouth daily. 120 tablet 3  . Sennosides-Docusate Sodium 8.6-50 MG CAPS Take 1 tablet by mouth as needed for constipation.    . tamsulosin (FLOMAX) 0.4 MG CAPS capsule Take 1 capsule (0.4 mg total) by mouth daily. 90 capsule 2   No current facility-administered medications for this visit.   Facility-Administered Medications Ordered in Other Visits  Medication Dose Route Frequency Provider Last Rate Last Admin  . dexamethasone (DECADRON) 10 mg in sodium chloride 0.9 % 50 mL IVPB  10 mg Intravenous Once Lloyd Huger, MD 204 mL/hr at 11/14/20 1026 10 mg at 11/14/20 1026  . fluorouracil (ADRUCIL) 4,800 mg in sodium chloride 0.9 % 54 mL chemo infusion  2,400 mg/m2 (Treatment Plan Recorded) Intravenous 1 day or 1 dose Lloyd Huger, MD      . fluorouracil (ADRUCIL) chemo injection 800 mg  400 mg/m2 (Treatment Plan Recorded) Intravenous Once Lloyd Huger, MD      . leucovorin 800 mg in dextrose 5 % 250 mL infusion  800 mg Intravenous Once Lloyd Huger, MD      . oxaliplatin (ELOXATIN) 170 mg in dextrose 5 % 500 mL chemo infusion  85 mg/m2 (Treatment Plan Recorded) Intravenous Once Lloyd Huger, MD      . sodium chloride flush (NS) 0.9 % injection 10 mL  10 mL Intravenous PRN Lloyd Huger, MD   10 mL at 11/14/20 0919    OBJECTIVE: Vitals:   11/14/20 0938  BP: 131/86  Pulse: 97  Resp: 20  Temp: (!) 97.4 F (36.3 C)     Body mass index is 21.66 kg/m.    ECOG FS:1 - Symptomatic but completely ambulatory  General: Well-developed, well-nourished, no acute distress. Eyes: Pink conjunctiva, anicteric sclera. HEENT: Normocephalic, moist mucous membranes. Lungs: No audible wheezing or coughing. Heart: Regular rate  and rhythm. Abdomen: Soft, nontender, no obvious distention. Musculoskeletal: No edema, cyanosis, or clubbing. Neuro: Alert, answering all questions appropriately. Cranial nerves grossly intact. Skin: No rashes or petechiae noted. Psych: Normal affect.   LAB RESULTS:  Lab Results  Component Value Date   NA 136 11/14/2020   K 3.7 11/14/2020   CL 98 11/14/2020   CO2 27 11/14/2020   GLUCOSE 99 11/14/2020   BUN 8 11/14/2020   CREATININE 0.95 11/14/2020   CALCIUM 9.3 11/14/2020   PROT 7.1 11/14/2020   ALBUMIN 3.4 (L) 11/14/2020   AST 21 11/14/2020   ALT 14 11/14/2020   ALKPHOS 92 11/14/2020   BILITOT 0.6 11/14/2020   GFRNONAA >60 11/14/2020    Lab Results  Component Value Date   WBC 4.8 11/14/2020   NEUTROABS 3.3 11/14/2020   HGB 11.8 (L) 11/14/2020   HCT 34.8 (L) 11/14/2020   MCV 98.0 11/14/2020   PLT 183 11/14/2020     STUDIES: No results found.  ASSESSMENT: Stage IIIb rectal cancer.  PLAN:    1.  Stage IIIb rectal cancer: Pathology and imaging results reviewed independently.  EUS completed at outside facility confirmed stage of disease.  PET scan results from June 28, 2020 reviewed independently with no obvious malignancy outside of patient's known rectal mass.  Patient completed his neoadjuvant treatment with chemotherapy and XRT on August 17, 2020.  His recent appointment with [facility] GI surgery recommended continued  neoadjuvant treatment with FOLFOX prior to surgical resection.  Patient does not wish to use capecitabine any further.  Proceed with cycle 4 of 8 of neoadjuvant FOLFOX today.  Return to clinic in 2 days for pump removal and then in 2 weeks for further evaluation and consideration of cycle 5.   2.  Weight loss: Patient's appetite is improving.  Appreciate dietary input. 3.  Pain: Improved.  Continue current narcotics as prescribed.  Appreciate palliative care input. 4.  Left kidney mass: 2.8 x 3.6 x 3.4 mass highly concerning for a second malignancy.  Patient will likely have to undergo partial nephrectomy at the time of his rectal surgery.  He has had consultation with urology at [facility]. 5.  Urinary retention: [facility] input.  Significantly improved.  Patient minimally has to self catheter. 6.  Diarrhea: Nearly resolved.   7.  Hypokalemia: Resolved. 8.  Anxiety: Improved.  Continue alprazolam as needed.  Appreciate palliative care input. 9.  Rash: Patient does not complain of this today.  Consider dermatology referral in the future. 10.  Transaminitis: Resolved.  Patient expressed understanding and was in agreement with this plan. He also understands that He can call clinic at any time with any questions, concerns, or complaints.   Cancer Staging Rectal cancer [facility]) Staging form: Colon and Rectum, AJCC 8th Edition - Clinical stage from 06/22/2020: Stage IIIB (cT3, cN1a, cM0) - Signed by Lloyd Huger, MD on 06/22/2020 Stage prefix: Initial diagnosis Total positive nodes: 1   Lloyd Huger, MD   11/14/2020 10:33 AM

## 2020-11-10 ENCOUNTER — Other Ambulatory Visit: Payer: Self-pay | Admitting: Pharmacist

## 2020-11-10 DIAGNOSIS — C20 Malignant neoplasm of rectum: Secondary | ICD-10-CM

## 2020-11-10 MED ORDER — CYCLOBENZAPRINE HCL 10 MG PO TABS: 10.0000 mg | ORAL_TABLET | Freq: Three times a day (TID) | ORAL | 0 refills | Status: DC | PRN

## 2020-11-14 ENCOUNTER — Inpatient Hospital Stay: Payer: Self-pay

## 2020-11-14 ENCOUNTER — Inpatient Hospital Stay (HOSPITAL_BASED_OUTPATIENT_CLINIC_OR_DEPARTMENT_OTHER): Payer: Self-pay | Admitting: Oncology

## 2020-11-14 ENCOUNTER — Encounter: Payer: Self-pay | Admitting: Oncology

## 2020-11-14 ENCOUNTER — Telehealth: Payer: Self-pay | Admitting: Hospice and Palliative Medicine

## 2020-11-14 ENCOUNTER — Inpatient Hospital Stay (HOSPITAL_BASED_OUTPATIENT_CLINIC_OR_DEPARTMENT_OTHER): Payer: Self-pay | Admitting: Hospice and Palliative Medicine

## 2020-11-14 VITALS — BP 131/86 | HR 97 | Temp 97.4°F | Resp 20 | Wt 168.7 lb

## 2020-11-14 DIAGNOSIS — C20 Malignant neoplasm of rectum: Secondary | ICD-10-CM

## 2020-11-14 DIAGNOSIS — Z515 Encounter for palliative care: Secondary | ICD-10-CM

## 2020-11-14 LAB — CBC WITH DIFFERENTIAL/PLATELET
Abs Immature Granulocytes: 0.02 10*3/uL (ref 0.00–0.07)
Basophils Absolute: 0 10*3/uL (ref 0.0–0.1)
Basophils Relative: 1 %
Eosinophils Absolute: 0.1 10*3/uL (ref 0.0–0.5)
Eosinophils Relative: 2 %
HCT: 34.8 % — ABNORMAL LOW (ref 39.0–52.0)
Hemoglobin: 11.8 g/dL — ABNORMAL LOW (ref 13.0–17.0)
Immature Granulocytes: 0 %
Lymphocytes Relative: 15 %
Lymphs Abs: 0.7 10*3/uL (ref 0.7–4.0)
MCH: 33.2 pg (ref 26.0–34.0)
MCHC: 33.9 g/dL (ref 30.0–36.0)
MCV: 98 fL (ref 80.0–100.0)
Monocytes Absolute: 0.6 10*3/uL (ref 0.1–1.0)
Monocytes Relative: 12 %
Neutro Abs: 3.3 10*3/uL (ref 1.7–7.7)
Neutrophils Relative %: 70 %
Platelets: 183 10*3/uL (ref 150–400)
RBC: 3.55 MIL/uL — ABNORMAL LOW (ref 4.22–5.81)
RDW: 14.7 % (ref 11.5–15.5)
WBC: 4.8 10*3/uL (ref 4.0–10.5)
nRBC: 0 % (ref 0.0–0.2)

## 2020-11-14 LAB — COMPREHENSIVE METABOLIC PANEL
ALT: 14 U/L (ref 0–44)
AST: 21 U/L (ref 15–41)
Albumin: 3.4 g/dL — ABNORMAL LOW (ref 3.5–5.0)
Alkaline Phosphatase: 92 U/L (ref 38–126)
Anion gap: 11 (ref 5–15)
BUN: 8 mg/dL (ref 6–20)
CO2: 27 mmol/L (ref 22–32)
Calcium: 9.3 mg/dL (ref 8.9–10.3)
Chloride: 98 mmol/L (ref 98–111)
Creatinine, Ser: 0.95 mg/dL (ref 0.61–1.24)
GFR, Estimated: >60 mL/min (ref >60)
Glucose, Bld: 99 mg/dL (ref 70–99)
Potassium: 3.7 mmol/L (ref 3.5–5.1)
Sodium: 136 mmol/L (ref 135–145)
Total Bilirubin: 0.6 mg/dL (ref 0.3–1.2)
Total Protein: 7.1 g/dL (ref 6.5–8.1)

## 2020-11-14 LAB — MAGNESIUM: Magnesium: 2 mg/dL (ref 1.7–2.4)

## 2020-11-14 MED ORDER — DEXTROSE 5 % IV SOLN
Freq: Once | INTRAVENOUS | Status: AC
  Filled 2020-11-14: qty 250

## 2020-11-14 MED ORDER — PALONOSETRON HCL INJECTION 0.25 MG/5ML
0.2500 mg | Freq: Once | INTRAVENOUS | Status: AC
  Administered 2020-11-14: 0.25 mg via INTRAVENOUS
  Filled 2020-11-14: qty 5

## 2020-11-14 MED ORDER — LEUCOVORIN CALCIUM INJECTION 350 MG
800.0000 mg | Freq: Once | INTRAVENOUS | Status: AC
  Administered 2020-11-14: 800 mg via INTRAVENOUS
  Filled 2020-11-14: qty 40

## 2020-11-14 MED ORDER — SODIUM CHLORIDE 0.9 % IV SOLN
10.0000 mg | Freq: Once | INTRAVENOUS | Status: AC
  Administered 2020-11-14: 10 mg via INTRAVENOUS
  Filled 2020-11-14: qty 10

## 2020-11-14 MED ORDER — SODIUM CHLORIDE 0.9 % IV SOLN
2400.0000 mg/m2 | INTRAVENOUS | Status: DC
  Administered 2020-11-14: 4800 mg via INTRAVENOUS
  Filled 2020-11-14: qty 96

## 2020-11-14 MED ORDER — FLUOROURACIL CHEMO INJECTION 2.5 GM/50ML
400.0000 mg/m2 | Freq: Once | INTRAVENOUS | Status: AC
  Administered 2020-11-14: 800 mg via INTRAVENOUS
  Filled 2020-11-14: qty 16

## 2020-11-14 MED ORDER — SODIUM CHLORIDE 0.9% FLUSH
10.0000 mL | INTRAVENOUS | Status: DC | PRN
  Administered 2020-11-14: 10 mL via INTRAVENOUS
  Filled 2020-11-14: qty 10

## 2020-11-14 MED ORDER — OXALIPLATIN CHEMO INJECTION 100 MG/20ML
85.0000 mg/m2 | Freq: Once | INTRAVENOUS | Status: AC
  Administered 2020-11-14: 170 mg via INTRAVENOUS
  Filled 2020-11-14: qty 34

## 2020-11-14 MED ORDER — MORPHINE SULFATE ER 15 MG PO TBCR: 15.0000 mg | EXTENDED_RELEASE_TABLET | Freq: Two times a day (BID) | ORAL | 0 refills | Status: DC

## 2020-11-14 NOTE — Progress Notes (Signed)
[place]  [phone] [phone]   Name: Malik Lynch Date: 11/14/2020 MRN: [id number]  DOB: 1970/11/20  Patient Care Team: Leonard Downing, MD as PCP - General (Family Medicine) [name], RN as Oncology Nurse Navigator    REASON FOR CONSULTATION: Malik Lynch is a 50 y.o. male with multiple medical problems including stage IIIb rectal cancer on XRT and neoadjuvant chemotherapy with capecitabine.  Patient has had pain, weight loss, and anxiety.  He was referred to palliative care to help address goals and manage ongoing symptoms.  SOCIAL HISTORY:     reports that he has been smoking cigarettes. He has been smoking about 1.00 pack per day. He has never used smokeless tobacco. He reports current alcohol use of about 1.0 standard drink of alcohol per week. He reports that he does not use drugs.  Patient is married and lives at home with his wife.  He has 2 sons who live nearby.  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    ADVANCE DIRECTIVES:  Not on file  CODE STATUS:   PAST MEDICAL HISTORY: Past Medical History:  Diagnosis Date  . Family history of breast cancer   . Family history of colon cancer   . Family history of kidney cancer   . Family history of prostate cancer   . Family history of uterine cancer     PAST SURGICAL HISTORY:  Past Surgical History:  Procedure Laterality Date  . IR IMAGING GUIDED PORT INSERTION  09/29/2020    HEMATOLOGY/ONCOLOGY HISTORY:  Oncology History  Rectal cancer ([place])  06/22/2020 Initial Diagnosis   Rectal cancer ([place])   06/22/2020 Cancer Staging   Staging form: Colon and Rectum, AJCC 8th Edition - Clinical stage from 06/22/2020: Stage IIIB (cT3, cN1a, cM0) - Signed by Lloyd Huger, MD on 06/22/2020   06/22/2020 - 06/22/2020 Chemotherapy          Genetic Testing   Negative genetic testing. No  pathogenic variants identified on the [employer] CancerNext-Expanded+RNA panel. The report date is 08/18/2020.   The CancerNext-Expanded + RNAinsight gene panel offered by [employer] and includes sequencing and rearrangement analysis for the following 77 genes: IP, ALK, APC*, ATM*, AXIN2, BAP1, BARD1, BLM, BMPR1A, BRCA1*, BRCA2*, BRIP1*, CDC73, CDH1*,CDK4, CDKN1B, CDKN2A, CHEK2*, CTNNA1, DICER1, FANCC, FH, FLCN, GALNT12, KIF1B, LZTR1, MAX, MEN1, MET, MLH1*, MSH2*, MSH3, MSH6*, MUTYH*, NBN, NF1*, NF2, NTHL1, PALB2*, PHOX2B, PMS2*, POT1, PRKAR1A, PTCH1, PTEN*, RAD51C*, RAD51D*,RB1, RECQL, RET, SDHA, SDHAF2, SDHB, SDHC, SDHD, SMAD4, SMARCA4, SMARCB1, SMARCE1, STK11, SUFU, TMEM127, TP53*,TSC1, TSC2, VHL and XRCC2 (sequencing and deletion/duplication); EGFR, EGLN1, HOXB13, KIT, MITF, PDGFRA, POLD1 and POLE (sequencing only); EPCAM and GREM1 (deletion/duplication only).    10/03/2020 -  Chemotherapy    Patient is on Treatment Plan: COLORECTAL FOLFOX Q14D X 4 MONTHS        ALLERGIES:  has No Known Allergies.  MEDICATIONS:  Current Outpatient Medications  Medication Sig Dispense Refill  . ALPRAZolam (XANAX) 0.5 MG tablet TAKE 1 TABLET BY MOUTH 2 TIMES DAILY AS NEEDED FOR ANXIETY. 60 tablet 0  . cyclobenzaprine (FLEXERIL) 10 MG tablet Take 1 tablet (10 mg total) by mouth 3 (three) times daily as needed for muscle spasms. 60 tablet 0  . ibuprofen (ADVIL,MOTRIN) 100 MG tablet Take 800 mg by mouth every 6 (six) hours as needed.    . lactulose (CHRONULAC) 10 GM/15ML solution Take 15-30 mLs (10-20 g total) by mouth  2 (two) times daily as needed for severe constipation. 236 mL 0  . lidocaine-prilocaine (EMLA) cream Apply to affected area once 30 g 3  . loperamide (IMODIUM A-D) 2 MG tablet Take 2 mg by mouth 4 (four) times daily as needed for diarrhea or loose stools.    . ondansetron (ZOFRAN) 8 MG tablet Take 1 tablet (8 mg total) by mouth every 8 (eight) hours as needed for nausea or vomiting. 45 tablet 0  .  oxyCODONE 30 MG 12 hr tablet Take 1 tablet (30 mg total) by mouth every 12 (twelve) hours. 60 tablet 0  . oxyCODONE-acetaminophen (PERCOCET/ROXICET) 5-325 MG tablet TAKE 1-2 TABLETS BY MOUTH EVERY 4 HOURS AS NEEDED FOR SEVERE PAIN. 90 tablet 0  . polyethylene glycol (MIRALAX / GLYCOLAX) 17 g packet Take 17 g by mouth daily.    . prochlorperazine (COMPAZINE) 10 MG tablet TAKE ONE TABLET BY MOUTH EVERY 6 HOURS AS NEEDED FOR NAUSEA / VOMITING 60 tablet 1  . senna (SENOKOT) 8.6 MG TABS tablet Take 1 tablet (8.6 mg total) by mouth daily. 120 tablet 3  . Sennosides-Docusate Sodium 8.6-50 MG CAPS Take 1 tablet by mouth as needed for constipation.    . tamsulosin (FLOMAX) 0.4 MG CAPS capsule Take 1 capsule (0.4 mg total) by mouth daily. 90 capsule 2   No current facility-administered medications for this visit.   Facility-Administered Medications Ordered in Other Visits  Medication Dose Route Frequency Provider Last Rate Last Admin  . sodium chloride flush (NS) 0.9 % injection 10 mL  10 mL Intravenous PRN Lloyd Huger, MD   10 mL at 11/14/20 0919    VITAL SIGNS: There were no vitals taken for this visit. There were no vitals filed for this visit.  Estimated body mass index is 21.72 kg/m as calculated from the following:   Height as of 10/31/20: $RemoveBe'6\' 2"'hBjueoaoZ$  (1.88 m).   Weight as of 10/31/20: 169 lb 3.2 oz (76.7 kg).  LABS: CBC:    Component Value Date/Time   WBC 5.7 10/31/2020 0856   HGB 11.9 (L) 10/31/2020 0856   HCT 34.9 (L) 10/31/2020 0856   PLT 210 10/31/2020 0856   MCV 98.9 10/31/2020 0856   NEUTROABS 4.1 10/31/2020 0856   LYMPHSABS 0.8 10/31/2020 0856   MONOABS 0.6 10/31/2020 0856   EOSABS 0.1 10/31/2020 0856   BASOSABS 0.0 10/31/2020 0856   Comprehensive Metabolic Panel:    Component Value Date/Time   NA 136 10/31/2020 0856   K 3.8 10/31/2020 0856   CL 98 10/31/2020 0856   CO2 26 10/31/2020 0856   BUN 11 10/31/2020 0856   CREATININE 0.85 10/31/2020 0856   GLUCOSE 112 (H)  10/31/2020 0856   CALCIUM 9.2 10/31/2020 0856   AST 18 10/31/2020 0856   ALT 17 10/31/2020 0856   ALKPHOS 101 10/31/2020 0856   BILITOT 0.6 10/31/2020 0856   PROT 7.4 10/31/2020 0856   ALBUMIN 3.4 (L) 10/31/2020 0856    RADIOGRAPHIC STUDIES: No results found.  PERFORMANCE STATUS (ECOG) : 1 - Symptomatic but completely ambulatory  Review of Systems Unless otherwise noted, a complete review of systems is negative.  Physical Exam General: NAD Pulmonary: Unlabored Extremities: no edema, no joint deformities Skin: Erythematous rash to torso  Neurological: Grossly nonfocal  IMPRESSION: Routine follow-up visit.  Patient seen in infusion.  Patient reports he is doing about the same.  He continues to endorse persistent generalized pain.  Previously, I had increased his OxyContin to 30 mg every 12 hours.  However, patient was unable to pick this up from the pharmacy as insurance would not cover the cost and there is going to cost out-of-pocket in excess of $1,200.  Discussed alternative long-acting regimen.  Will rotate to MS Contin, which should be considerably less expensive.  Continue Percocet as needed for breakthrough pain.  PLAN: -Continue current scope of treatment -Continue alprazolam 0.5 mg twice daily as needed for anxiety -Start MS Contin 15 mg every 12 hours #30 -Refill Percocet 5-325mg  (1 to 2 tablets) every 4 hours as needed for breakthrough pain -Continue lactulose 15 to 30 mL twice daily as needed for constipation -Follow-up MyChart visit 1 month   Case and plan discussed with Dr. Grayland Ormond   Patient expressed understanding and was in agreement with this plan. He also understands that He can call the clinic at any time with any questions, concerns, or complaints.     Time Total: 15 minutes  Visit consisted of counseling and education dealing with the complex and emotionally intense issues of symptom management and palliative care in the setting of serious and  potentially life-threatening illness.Greater than 50%  of this time was spent counseling and coordinating care related to the above assessment and plan.  Signed by: Altha Harm, PhD, NP-C

## 2020-11-14 NOTE — Progress Notes (Signed)
Nutrition Follow-up:  Patient with rectal cancer.  Patient has completed radiation.  Patient receiving folfox chemotherapy.  Planning surgery at [facility] 7/19.    Met with patient during infusion.  Patient reports appetite has been fairly good over the last few weeks.  Bowels have been a little more regular and had to cath less often.  Wife continues to prepare meals and snacks for him.  He is eating about 3 meals per day with snacks in between (likes fruit, pudding cups).  Taste has been off. Using plastic utensils due to metal taste in mouth.      Medications: reviewed  Labs: reviewed  Anthropometrics:   Weight 168 lb today  169 lb 3.2 oz on 5/3  170 lb on 4/19 172 lb on 3/24 175 lb on 3/3 170 lb on 2/15  NUTRITION DIAGNOSIS:  Inadequate oral intake stable   INTERVENTION:  Discussed strategies for taste change. Handout provided     MONITORING, EVALUATION, GOAL: weight trends, intake   NEXT VISIT: to be determined with treatment  Malik Lynch, [place], [place] Registered Dietitian [phone] (mobile)

## 2020-11-14 NOTE — Progress Notes (Signed)
Patient here today for follow up, treatment consideration. Patient reports improvement in sleeping and pain management.

## 2020-11-14 NOTE — Telephone Encounter (Signed)
I received a call from patient's wife the patient has had some oral sores.  Rx called for Magic mouthwash to pharmacy.

## 2020-11-14 NOTE — Patient Instructions (Signed)
[facility]  Discharge Instructions: Thank you for choosing [facility] to provide your oncology and hematology care.  If you have a lab appointment with the [facility], please go directly to the [facility] and check in at the registration area.  Wear comfortable clothing and clothing appropriate for easy access to any Portacath or PICC line.   We strive to give you quality time with your provider. You may need to reschedule your appointment if you arrive late (15 or more minutes).  Arriving late affects you and other patients whose appointments are after yours.  Also, if you miss three or more appointments without notifying the office, you may be dismissed from the clinic at the provider's discretion.      For prescription refill requests, have your pharmacy contact our office and allow 72 hours for refills to be completed.    Today you received the following chemotherapy and/or immunotherapy agents Oxaliplatin, Leucovorin, Adrucil       To help prevent nausea and vomiting after your treatment, we encourage you to take your nausea medication as directed.  BELOW ARE SYMPTOMS THAT SHOULD BE REPORTED IMMEDIATELY: . *FEVER GREATER THAN 100.4 F (38 C) OR HIGHER . *CHILLS OR SWEATING . *NAUSEA AND VOMITING THAT IS NOT CONTROLLED WITH YOUR NAUSEA MEDICATION . *UNUSUAL SHORTNESS OF BREATH . *UNUSUAL BRUISING OR BLEEDING . *URINARY PROBLEMS (pain or burning when urinating, or frequent urination) . *BOWEL PROBLEMS (unusual diarrhea, constipation, pain near the anus) . TENDERNESS IN MOUTH AND THROAT WITH OR WITHOUT PRESENCE OF ULCERS (sore throat, sores in mouth, or a toothache) . UNUSUAL RASH, SWELLING OR PAIN  . UNUSUAL VAGINAL DISCHARGE OR ITCHING   Items with * indicate a potential emergency and should be followed up as soon as possible or go to the Emergency Department if any problems should occur.  Please show the CHEMOTHERAPY ALERT  CARD or IMMUNOTHERAPY ALERT CARD at check-in to the Emergency Department and triage nurse.  Should you have questions after your visit or need to cancel or reschedule your appointment, please contact [facility]  [phone] and follow the prompts.  Office hours are 8:00 a.m. to 4:30 p.m. Monday - Friday. Please note that voicemails left after 4:00 p.m. may not be returned until the following business day.  We are closed weekends and major holidays. You have access to a nurse at all times for urgent questions. Please call the main number to the clinic [phone] and follow the prompts.  For any non-urgent questions, you may also contact your provider using MyChart. We now offer e-Visits for anyone 20 and older to request care online for non-urgent symptoms. For details visit [email or web address].   Also download the MyChart app! Go to the app store, search "MyChart", open the app, select [facility], and log in with your MyChart username and password.  Due to Covid, a mask is required upon entering the hospital/clinic. If you do not have a mask, one will be given to you upon arrival. For doctor visits, patients may have 1 support person aged 106 or older with them. For treatment visits, patients cannot have anyone with them due to current Covid guidelines and our immunocompromised population.

## 2020-11-15 ENCOUNTER — Telehealth: Payer: Self-pay

## 2020-11-15 NOTE — Telephone Encounter (Signed)
Sent RX script for mouthwash

## 2020-11-16 ENCOUNTER — Other Ambulatory Visit: Payer: Self-pay

## 2020-11-16 ENCOUNTER — Inpatient Hospital Stay: Payer: Self-pay

## 2020-11-16 DIAGNOSIS — C20 Malignant neoplasm of rectum: Secondary | ICD-10-CM

## 2020-11-16 MED ORDER — HEPARIN SOD (PORK) LOCK FLUSH 100 UNIT/ML IV SOLN
500.0000 [IU] | Freq: Once | INTRAVENOUS | Status: AC | PRN
  Administered 2020-11-16: 500 [IU]
  Filled 2020-11-16: qty 5

## 2020-11-16 MED ORDER — SODIUM CHLORIDE 0.9% FLUSH
10.0000 mL | INTRAVENOUS | Status: DC | PRN
  Administered 2020-11-16: 10 mL
  Filled 2020-11-16: qty 10

## 2020-11-23 NOTE — Progress Notes (Signed)
[place]  [phone]) [phone] [phone]) [phone]  ID: Malik Lynch OB: 1970/07/03  MR#: [id number]  RXV#:[id number]  Patient Care Team: Leonard Downing, MD as PCP - General (Family Medicine) [name], RN as Oncology Nurse Navigator  CHIEF COMPLAINT: Stage IIIb rectal cancer.  INTERVAL HISTORY: Patient returns to clinic today for further evaluation and consideration of cycle 5 of neoadjuvant FOLFOX.  His pain is better controlled on his current narcotic regimen.  He continues to only have to minimally self cath for urination.  He has chronic weakness and fatigue. He has no neurologic complaints. He denies any recent fevers or illnesses. He has no chest pain, shortness of breath, cough, or hemoptysis.  He denies any nausea, vomiting, constipation, or diarrhea.  Patient offers no further specific complaints today.  REVIEW OF SYSTEMS:   Review of Systems  Constitutional: Positive for malaise/fatigue. Negative for fever and weight loss.  Respiratory: Negative.  Negative for cough, hemoptysis and shortness of breath.   Cardiovascular: Negative.  Negative for chest pain and leg swelling.  Gastrointestinal: Positive for abdominal pain. Negative for blood in stool, diarrhea and melena.  Genitourinary: Negative.  Negative for urgency.  Musculoskeletal: Negative.  Negative for back pain.  Skin: Negative.  Negative for rash.  Neurological: Positive for weakness. Negative for dizziness, focal weakness and headaches.  Psychiatric/Behavioral: Negative.  The patient is not nervous/anxious.     As per HPI. Otherwise, a complete review of systems is negative.  PAST MEDICAL HISTORY: Past Medical History:  Diagnosis Date  . Family history of breast cancer   . Family history of colon cancer   . Family history of kidney cancer   . Family history of prostate cancer   . Family history of uterine cancer     PAST SURGICAL HISTORY: Past Surgical History:   Procedure Laterality Date  . IR IMAGING GUIDED PORT INSERTION  09/29/2020    FAMILY HISTORY: Family History  Problem Relation Age of Onset  . Throat cancer Mother 33  . Lymphoma Maternal Aunt   . Kidney cancer Maternal Uncle   . Breast cancer Paternal Aunt   . Colon cancer Paternal Uncle   . Lung cancer Maternal Grandmother   . Cancer Maternal Grandmother        mouth  . Prostate cancer Maternal Grandfather        dx 71s  . Colon cancer Maternal Grandfather        dx 47s  . Lung cancer Maternal Uncle   . Cancer Maternal Uncle        possibly kidney  . Uterine cancer Paternal Aunt   . Cancer Paternal Aunt        reproductive  . Kidney cancer Paternal Uncle   . Breast cancer Cousin   . Breast cancer Cousin   . Colon cancer Cousin        dx 51s    ADVANCED DIRECTIVES (Y/N):  N  HEALTH MAINTENANCE: Social History   Tobacco Use  . Smoking status: Current Some Day Smoker    Packs/day: 1.00    Types: Cigarettes  . Smokeless tobacco: Never Used  Vaping Use  . Vaping Use: Never used  Substance Use Topics  . Alcohol use: Yes    Alcohol/week: 1.0 standard drink    Types: 1 Cans of beer per week  . Drug use: No     Colonoscopy:  PAP:  Bone density:  Lipid panel:  No Known Allergies  Current Outpatient Medications  Medication Sig Dispense Refill  . ALPRAZolam (XANAX) 0.5 MG tablet TAKE 1 TABLET BY MOUTH 2 TIMES DAILY AS NEEDED FOR ANXIETY. 60 tablet 0  . cyclobenzaprine (FLEXERIL) 10 MG tablet Take 1 tablet (10 mg total) by mouth 3 (three) times daily as needed for muscle spasms. 60 tablet 0  . ibuprofen (ADVIL,MOTRIN) 100 MG tablet Take 800 mg by mouth every 6 (six) hours as needed.    . lactulose (CHRONULAC) 10 GM/15ML solution Take 15-30 mLs (10-20 g total) by mouth 2 (two) times daily as needed for severe constipation. 236 mL 0  . lidocaine-prilocaine (EMLA) cream Apply to affected area once 30 g 3  . loperamide (IMODIUM A-D) 2 MG tablet Take 2 mg by mouth 4  (four) times daily as needed for diarrhea or loose stools.    . ondansetron (ZOFRAN) 8 MG tablet Take 1 tablet (8 mg total) by mouth every 8 (eight) hours as needed for nausea or vomiting. 45 tablet 0  . oxyCODONE-acetaminophen (PERCOCET/ROXICET) 5-325 MG tablet TAKE 1-2 TABLETS BY MOUTH EVERY 4 HOURS AS NEEDED FOR SEVERE PAIN. 90 tablet 0  . polyethylene glycol (MIRALAX / GLYCOLAX) 17 g packet Take 17 g by mouth daily.    . prochlorperazine (COMPAZINE) 10 MG tablet TAKE ONE TABLET BY MOUTH EVERY 6 HOURS AS NEEDED FOR NAUSEA / VOMITING 60 tablet 1  . senna (SENOKOT) 8.6 MG TABS tablet Take 1 tablet (8.6 mg total) by mouth daily. 120 tablet 3  . Sennosides-Docusate Sodium 8.6-50 MG CAPS Take 1 tablet by mouth as needed for constipation.    . tamsulosin (FLOMAX) 0.4 MG CAPS capsule Take 1 capsule (0.4 mg total) by mouth daily. 90 capsule 2   No current facility-administered medications for this visit.    OBJECTIVE: Vitals:   11/28/20 0911  BP: 114/87  Pulse: (!) 110  Resp: 18  Temp: (!) 97.2 F (36.2 C)  SpO2: 100%     Body mass index is 21.62 kg/m.    ECOG FS:1 - Symptomatic but completely ambulatory  General: Well-developed, well-nourished, no acute distress. Eyes: Pink conjunctiva, anicteric sclera. HEENT: Normocephalic, moist mucous membranes. Lungs: No audible wheezing or coughing. Heart: Regular rate and rhythm. Abdomen: Soft, nontender, no obvious distention. Musculoskeletal: No edema, cyanosis, or clubbing. Neuro: Alert, answering all questions appropriately. Cranial nerves grossly intact. Skin: No rashes or petechiae noted. Psych: Normal affect.   LAB RESULTS:  Lab Results  Component Value Date   NA 134 (L) 11/28/2020   K 4.0 11/28/2020   CL 100 11/28/2020   CO2 25 11/28/2020   GLUCOSE 92 11/28/2020   BUN 11 11/28/2020   CREATININE 0.93 11/28/2020   CALCIUM 9.4 11/28/2020   PROT 7.3 11/28/2020   ALBUMIN 3.7 11/28/2020   AST 21 11/28/2020   ALT 13 11/28/2020    ALKPHOS 96 11/28/2020   BILITOT 0.6 11/28/2020   GFRNONAA >60 11/28/2020    Lab Results  Component Value Date   WBC 7.2 11/28/2020   NEUTROABS 5.5 11/28/2020   HGB 12.6 (L) 11/28/2020   HCT 36.5 (L) 11/28/2020   MCV 97.1 11/28/2020   PLT 112 (L) 11/28/2020     STUDIES: No results found.  ASSESSMENT: Stage IIIb rectal cancer.  PLAN:    1.  Stage IIIb rectal cancer: Pathology and imaging results reviewed independently.  EUS completed at outside facility confirmed stage of disease.  PET scan results from June 28, 2020 reviewed independently with no obvious malignancy outside of patient's known rectal mass.  Patient completed his neoadjuvant treatment with chemotherapy and XRT on August 17, 2020.  His recent appointment with [facility] GI surgery recommended continued neoadjuvant treatment with FOLFOX prior to surgical resection.  Patient does not wish to use capecitabine any further.  Proceed with cycle 5 of 8 of neoadjuvant FOLFOX today.  Return to clinic in 2 days for pump removal and then in 2 weeks for further evaluation and consideration of cycle 6.  2.  Weight loss: Patient continues to have an improved appetite.  Appreciate dietary input. 3.  Pain: Improved.  Patient states he takes 15 mg of OxyContin every 8 hours and only 1-2 Percocets for breakthrough pain.  Appreciate palliative care input. 4.  Left kidney mass: 2.8 x 3.6 x 3.4 mass highly concerning for a second malignancy.  Patient will likely have to undergo partial nephrectomy at the time of his rectal surgery.  He has had consultation with urology at [facility]. 5.  Urinary retention: [place] urology input.  Significantly improved.  Patient minimally has to self catheter. 6.  Diarrhea: Resolved. 7.  Hypokalemia: Resolved. 8.  Anxiety: Chronic and unchanged.  Continue alprazolam as needed.  Appreciate palliative care input. 9.  Rash: Patient does not complain of this today.  Consider dermatology referral in the future. 10.   Transaminitis: Resolved. 11.  Total cytopenia: Mild, proceed with treatment as above. 12.  Hyponatremia: Mild, monitor.  Patient sodium levels 134 today.  Patient expressed understanding and was in agreement with this plan. He also understands that He can call clinic at any time with any questions, concerns, or complaints.   Cancer Staging Rectal cancer [facility] Staging form: Colon and Rectum, AJCC 8th Edition - Clinical stage from 06/22/2020: Stage IIIB (cT3, cN1a, cM0) - Signed by Lloyd Huger, MD on 06/22/2020 Stage prefix: Initial diagnosis Total positive nodes: 1   Lloyd Huger, MD   11/28/2020 9:45 AM

## 2020-11-28 ENCOUNTER — Inpatient Hospital Stay (HOSPITAL_BASED_OUTPATIENT_CLINIC_OR_DEPARTMENT_OTHER): Payer: Self-pay | Admitting: Oncology

## 2020-11-28 ENCOUNTER — Other Ambulatory Visit: Payer: Self-pay | Admitting: *Deleted

## 2020-11-28 ENCOUNTER — Encounter: Payer: Self-pay | Admitting: Oncology

## 2020-11-28 ENCOUNTER — Inpatient Hospital Stay: Payer: Self-pay

## 2020-11-28 ENCOUNTER — Other Ambulatory Visit: Payer: Self-pay

## 2020-11-28 VITALS — BP 114/87 | HR 110 | Temp 97.2°F | Resp 18 | Ht 74.0 in | Wt 168.4 lb

## 2020-11-28 DIAGNOSIS — C20 Malignant neoplasm of rectum: Secondary | ICD-10-CM

## 2020-11-28 LAB — COMPREHENSIVE METABOLIC PANEL
ALT: 13 U/L (ref 0–44)
AST: 21 U/L (ref 15–41)
Albumin: 3.7 g/dL (ref 3.5–5.0)
Alkaline Phosphatase: 96 U/L (ref 38–126)
Anion gap: 9 (ref 5–15)
BUN: 11 mg/dL (ref 6–20)
CO2: 25 mmol/L (ref 22–32)
Calcium: 9.4 mg/dL (ref 8.9–10.3)
Chloride: 100 mmol/L (ref 98–111)
Creatinine, Ser: 0.93 mg/dL (ref 0.61–1.24)
GFR, Estimated: >60 mL/min (ref >60)
Glucose, Bld: 92 mg/dL (ref 70–99)
Potassium: 4 mmol/L (ref 3.5–5.1)
Sodium: 134 mmol/L — ABNORMAL LOW (ref 135–145)
Total Bilirubin: 0.6 mg/dL (ref 0.3–1.2)
Total Protein: 7.3 g/dL (ref 6.5–8.1)

## 2020-11-28 LAB — CBC WITH DIFFERENTIAL/PLATELET
Abs Immature Granulocytes: 0.03 10*3/uL (ref 0.00–0.07)
Basophils Absolute: 0.1 10*3/uL (ref 0.0–0.1)
Basophils Relative: 1 %
Eosinophils Absolute: 0.1 10*3/uL (ref 0.0–0.5)
Eosinophils Relative: 2 %
HCT: 36.5 % — ABNORMAL LOW (ref 39.0–52.0)
Hemoglobin: 12.6 g/dL — ABNORMAL LOW (ref 13.0–17.0)
Immature Granulocytes: 0 %
Lymphocytes Relative: 11 %
Lymphs Abs: 0.8 10*3/uL (ref 0.7–4.0)
MCH: 33.5 pg (ref 26.0–34.0)
MCHC: 34.5 g/dL (ref 30.0–36.0)
MCV: 97.1 fL (ref 80.0–100.0)
Monocytes Absolute: 0.8 10*3/uL (ref 0.1–1.0)
Monocytes Relative: 11 %
Neutro Abs: 5.5 10*3/uL (ref 1.7–7.7)
Neutrophils Relative %: 75 %
Platelets: 112 10*3/uL — ABNORMAL LOW (ref 150–400)
RBC: 3.76 MIL/uL — ABNORMAL LOW (ref 4.22–5.81)
RDW: 15.6 % — ABNORMAL HIGH (ref 11.5–15.5)
WBC: 7.2 10*3/uL (ref 4.0–10.5)
nRBC: 0 % (ref 0.0–0.2)

## 2020-11-28 MED ORDER — LEUCOVORIN CALCIUM INJECTION 350 MG
800.0000 mg | Freq: Once | INTRAVENOUS | Status: AC
  Administered 2020-11-28: 800 mg via INTRAVENOUS
  Filled 2020-11-28: qty 17.5

## 2020-11-28 MED ORDER — FLUOROURACIL CHEMO INJECTION 2.5 GM/50ML
400.0000 mg/m2 | Freq: Once | INTRAVENOUS | Status: AC
  Administered 2020-11-28: 800 mg via INTRAVENOUS
  Filled 2020-11-28: qty 16

## 2020-11-28 MED ORDER — OXALIPLATIN CHEMO INJECTION 100 MG/20ML
85.0000 mg/m2 | Freq: Once | INTRAVENOUS | Status: AC
  Administered 2020-11-28: 170 mg via INTRAVENOUS
  Filled 2020-11-28: qty 34

## 2020-11-28 MED ORDER — OXYCODONE HCL ER 15 MG PO T12A: 15.0000 mg | EXTENDED_RELEASE_TABLET | Freq: Three times a day (TID) | ORAL | 0 refills | Status: DC

## 2020-11-28 MED ORDER — PALONOSETRON HCL INJECTION 0.25 MG/5ML
0.2500 mg | Freq: Once | INTRAVENOUS | Status: AC
  Administered 2020-11-28: 0.25 mg via INTRAVENOUS
  Filled 2020-11-28: qty 5

## 2020-11-28 MED ORDER — SODIUM CHLORIDE 0.9 % IV SOLN
10.0000 mg | Freq: Once | INTRAVENOUS | Status: AC
Start: 2020-11-28 — End: 2020-11-28
  Administered 2020-11-28: 10 mg via INTRAVENOUS
  Filled 2020-11-28: qty 10

## 2020-11-28 MED ORDER — DEXTROSE 5 % IV SOLN
Freq: Once | INTRAVENOUS | Status: AC
  Filled 2020-11-28: qty 250

## 2020-11-28 MED ORDER — OXYCODONE-ACETAMINOPHEN 5-325 MG PO TABS: ORAL_TABLET | ORAL | 0 refills | Status: DC

## 2020-11-28 MED ORDER — SODIUM CHLORIDE 0.9 % IV SOLN
2400.0000 mg/m2 | INTRAVENOUS | Status: DC
  Administered 2020-11-28: 4800 mg via INTRAVENOUS
  Filled 2020-11-28: qty 96

## 2020-11-28 NOTE — Progress Notes (Signed)
Morphine was not working for him sts he tried it for 3 days and could not sleep with it . So he stopped and went back on 15mg  8 hr oxy .. percept  . Only been eating a lot of fruit .

## 2020-11-28 NOTE — Patient Instructions (Signed)
[facility]    Discharge Instructions: Thank you for choosing [facility] to provide your oncology and hematology care.  If you have a lab appointment with the [facility], please go directly to the [facility] and check in at the registration area.  Wear comfortable clothing and clothing appropriate for easy access to any Portacath or PICC line.   We strive to give you quality time with your provider. You may need to reschedule your appointment if you arrive late (15 or more minutes).  Arriving late affects you and other patients whose appointments are after yours.  Also, if you miss three or more appointments without notifying the office, you may be dismissed from the clinic at the provider's discretion.      For prescription refill requests, have your pharmacy contact our office and allow 72 hours for refills to be completed.    Today you received the following chemotherapy and/or immunotherapy agents Oxaliplatin, Leucovorin, Fluorouracil  Fluorouracil, 5-FU injection What is this medicine? FLUOROURACIL, 5-FU (flure oh YOOR a sil) is a chemotherapy drug. It slows the growth of cancer cells. This medicine is used to treat many types of cancer like breast cancer, colon or rectal cancer, pancreatic cancer, and stomach cancer. This medicine may be used for other purposes; ask your health care provider or pharmacist if you have questions. COMMON BRAND NAME(S): Adrucil What should I tell my health care provider before I take this medicine? They need to know if you have any of these conditions:  blood disorders  dihydropyrimidine dehydrogenase (DPD) deficiency  infection (especially a virus infection such as chickenpox, cold sores, or herpes)  kidney disease  liver disease  malnourished, poor nutrition  recent or ongoing radiation therapy  an unusual or allergic reaction to fluorouracil, other chemotherapy, other medicines, foods,  dyes, or preservatives  pregnant or trying to get pregnant  breast-feeding How should I use this medicine? This drug is given as an infusion or injection into a vein. It is administered in a hospital or clinic by a specially trained health care professional. Talk to your pediatrician regarding the use of this medicine in children. Special care may be needed. Overdosage: If you think you have taken too much of this medicine contact a poison control center or emergency room at once. NOTE: This medicine is only for you. Do not share this medicine with others. What if I miss a dose? It is important not to miss your dose. Call your doctor or health care professional if you are unable to keep an appointment. What may interact with this medicine? Do not take this medicine with any of the following medications:  live virus vaccines This medicine may also interact with the following medications:  medicines that treat or prevent blood clots like warfarin, enoxaparin, and dalteparin This list may not describe all possible interactions. Give your health care provider a list of all the medicines, herbs, non-prescription drugs, or dietary supplements you use. Also tell them if you smoke, drink alcohol, or use illegal drugs. Some items may interact with your medicine. What should I watch for while using this medicine? Visit your doctor for checks on your progress. This drug may make you feel generally unwell. This is not uncommon, as chemotherapy can affect healthy cells as well as cancer cells. Report any side effects. Continue your course of treatment even though you feel ill unless your doctor tells you to stop. In some cases, you may be given  additional medicines to help with side effects. Follow all directions for their use. Call your doctor or health care professional for advice if you get a fever, chills or sore throat, or other symptoms of a cold or flu. Do not treat yourself. This drug decreases  your body's ability to fight infections. Try to avoid being around people who are sick. This medicine may increase your risk to bruise or bleed. Call your doctor or health care professional if you notice any unusual bleeding. Be careful brushing and flossing your teeth or using a toothpick because you may get an infection or bleed more easily. If you have any dental work done, tell your dentist you are receiving this medicine. Avoid taking products that contain aspirin, acetaminophen, ibuprofen, naproxen, or ketoprofen unless instructed by your doctor. These medicines may hide a fever. Do not become pregnant while taking this medicine. Women should inform their doctor if they wish to become pregnant or think they might be pregnant. There is a potential for serious side effects to an unborn child. Talk to your health care professional or pharmacist for more information. Do not breast-feed an infant while taking this medicine. Men should inform their doctor if they wish to father a child. This medicine may lower sperm counts. Do not treat diarrhea with over the counter products. Contact your doctor if you have diarrhea that lasts more than 2 days or if it is severe and watery. This medicine can make you more sensitive to the sun. Keep out of the sun. If you cannot avoid being in the sun, wear protective clothing and use sunscreen. Do not use sun lamps or tanning beds/booths. What side effects may I notice from receiving this medicine? Side effects that you should report to your doctor or health care professional as soon as possible:  allergic reactions like skin rash, itching or hives, swelling of the face, lips, or tongue  low blood counts - this medicine may decrease the number of white blood cells, red blood cells and platelets. You may be at increased risk for infections and bleeding.  signs of infection - fever or chills, cough, sore throat, pain or difficulty passing urine  signs of decreased  platelets or bleeding - bruising, pinpoint red spots on the skin, black, tarry stools, blood in the urine  signs of decreased red blood cells - unusually weak or tired, fainting spells, lightheadedness  breathing problems  changes in vision  chest pain  mouth sores  nausea and vomiting  pain, swelling, redness at site where injected  pain, tingling, numbness in the hands or feet  redness, swelling, or sores on hands or feet  stomach pain  unusual bleeding Side effects that usually do not require medical attention (report to your doctor or health care professional if they continue or are bothersome):  changes in finger or toe nails  diarrhea  dry or itchy skin  hair loss  headache  loss of appetite  sensitivity of eyes to the light  stomach upset  unusually teary eyes This list may not describe all possible side effects. Call your doctor for medical advice about side effects. You may report side effects to FDA at 1-800-FDA-1088. Where should I keep my medicine? This drug is given in a hospital or clinic and will not be stored at home. NOTE: This sheet is a summary. It may not cover all possible information. If you have questions about this medicine, talk to your doctor, pharmacist, or health care provider.  2021  Elsevier/Gold Standard (2019-05-18 15:00:03) Leucovorin injection What is this medicine? LEUCOVORIN (loo koe VOR in) is used to prevent or treat the harmful effects of some medicines. This medicine is used to treat anemia caused by a low amount of folic acid in the body. It is also used with 5-fluorouracil (5-FU) to treat colon cancer. This medicine may be used for other purposes; ask your health care provider or pharmacist if you have questions. What should I tell my health care provider before I take this medicine? They need to know if you have any of these conditions:  anemia from low levels of vitamin B-12 in the blood  an unusual or allergic  reaction to leucovorin, folic acid, other medicines, foods, dyes, or preservatives  pregnant or trying to get pregnant  breast-feeding How should I use this medicine? This medicine is for injection into a muscle or into a vein. It is given by a health care professional in a hospital or clinic setting. Talk to your pediatrician regarding the use of this medicine in children. Special care may be needed. Overdosage: If you think you have taken too much of this medicine contact a poison control center or emergency room at once. NOTE: This medicine is only for you. Do not share this medicine with others. What if I miss a dose? This does not apply. What may interact with this medicine?  capecitabine  fluorouracil  phenobarbital  phenytoin  primidone  trimethoprim-sulfamethoxazole This list may not describe all possible interactions. Give your health care provider a list of all the medicines, herbs, non-prescription drugs, or dietary supplements you use. Also tell them if you smoke, drink alcohol, or use illegal drugs. Some items may interact with your medicine. What should I watch for while using this medicine? Your condition will be monitored carefully while you are receiving this medicine. This medicine may increase the side effects of 5-fluorouracil, 5-FU. Tell your doctor or health care professional if you have diarrhea or mouth sores that do not get better or that get worse. What side effects may I notice from receiving this medicine? Side effects that you should report to your doctor or health care professional as soon as possible:  allergic reactions like skin rash, itching or hives, swelling of the face, lips, or tongue  breathing problems  fever, infection  mouth sores  unusual bleeding or bruising  unusually weak or tired Side effects that usually do not require medical attention (report to your doctor or health care professional if they continue or are  bothersome):  constipation or diarrhea  loss of appetite  nausea, vomiting This list may not describe all possible side effects. Call your doctor for medical advice about side effects. You may report side effects to FDA at 1-800-FDA-1088. Where should I keep my medicine? This drug is given in a hospital or clinic and will not be stored at home. NOTE: This sheet is a summary. It may not cover all possible information. If you have questions about this medicine, talk to your doctor, pharmacist, or health care provider.  2021 Elsevier/Gold Standard (2007-12-22 16:50:29) Oxaliplatin Injection What is this medicine? OXALIPLATIN (ox AL i PLA tin) is a chemotherapy drug. It targets fast dividing cells, like cancer cells, and causes these cells to die. This medicine is used to treat cancers of the colon and rectum, and many other cancers. This medicine may be used for other purposes; ask your health care provider or pharmacist if you have questions. COMMON BRAND NAME(S): Eloxatin What  should I tell my health care provider before I take this medicine? They need to know if you have any of these conditions: heart disease history of irregular heartbeat liver disease low blood counts, like white cells, platelets, or red blood cells lung or breathing disease, like asthma take medicines that treat or prevent blood clots tingling of the fingers or toes, or other nerve disorder an unusual or allergic reaction to oxaliplatin, other chemotherapy, other medicines, foods, dyes, or preservatives pregnant or trying to get pregnant breast-feeding How should I use this medicine? This drug is given as an infusion into a vein. It is administered in a hospital or clinic by a specially trained health care professional. Talk to your pediatrician regarding the use of this medicine in children. Special care may be needed. Overdosage: If you think you have taken too much of this medicine contact a poison control  center or emergency room at once. NOTE: This medicine is only for you. Do not share this medicine with others. What if I miss a dose? It is important not to miss a dose. Call your doctor or health care professional if you are unable to keep an appointment. What may interact with this medicine? Do not take this medicine with any of the following medications: cisapride dronedarone pimozide thioridazine This medicine may also interact with the following medications: aspirin and aspirin-like medicines certain medicines that treat or prevent blood clots like warfarin, apixaban, dabigatran, and rivaroxaban cisplatin cyclosporine diuretics medicines for infection like acyclovir, adefovir, amphotericin B, bacitracin, cidofovir, foscarnet, ganciclovir, gentamicin, pentamidine, vancomycin NSAIDs, medicines for pain and inflammation, like ibuprofen or naproxen other medicines that prolong the QT interval (an abnormal heart rhythm) pamidronate zoledronic acid This list may not describe all possible interactions. Give your health care provider a list of all the medicines, herbs, non-prescription drugs, or dietary supplements you use. Also tell them if you smoke, drink alcohol, or use illegal drugs. Some items may interact with your medicine. What should I watch for while using this medicine? Your condition will be monitored carefully while you are receiving this medicine. You may need blood work done while you are taking this medicine. This medicine may make you feel generally unwell. This is not uncommon as chemotherapy can affect healthy cells as well as cancer cells. Report any side effects. Continue your course of treatment even though you feel ill unless your healthcare professional tells you to stop. This medicine can make you more sensitive to cold. Do not drink cold drinks or use ice. Cover exposed skin before coming in contact with cold temperatures or cold objects. When out in cold weather  wear warm clothing and cover your mouth and nose to warm the air that goes into your lungs. Tell your doctor if you get sensitive to the cold. Do not become pregnant while taking this medicine or for 9 months after stopping it. Women should inform their health care professional if they wish to become pregnant or think they might be pregnant. Men should not father a child while taking this medicine and for 6 months after stopping it. There is potential for serious side effects to an unborn child. Talk to your health care professional for more information. Do not breast-feed a child while taking this medicine or for 3 months after stopping it. This medicine has caused ovarian failure in some women. This medicine may make it more difficult to get pregnant. Talk to your health care professional if you are concerned about your fertility. This  medicine has caused decreased sperm counts in some men. This may make it more difficult to father a child. Talk to your health care professional if you are concerned about your fertility. This medicine may increase your risk of getting an infection. Call your health care professional for advice if you get a fever, chills, or sore throat, or other symptoms of a cold or flu. Do not treat yourself. Try to avoid being around people who are sick. Avoid taking medicines that contain aspirin, acetaminophen, ibuprofen, naproxen, or ketoprofen unless instructed by your health care professional. These medicines may hide a fever. Be careful brushing or flossing your teeth or using a toothpick because you may get an infection or bleed more easily. If you have any dental work done, tell your dentist you are receiving this medicine. What side effects may I notice from receiving this medicine? Side effects that you should report to your doctor or health care professional as soon as possible: allergic reactions like skin rash, itching or hives, swelling of the face, lips, or  tongue breathing problems cough low blood counts - this medicine may decrease the number of white blood cells, red blood cells, and platelets. You may be at increased risk for infections and bleeding nausea, vomiting pain, redness, or irritation at site where injected pain, tingling, numbness in the hands or feet signs and symptoms of bleeding such as bloody or black, tarry stools; red or dark brown urine; spitting up blood or brown material that looks like coffee grounds; red spots on the skin; unusual bruising or bleeding from the eyes, gums, or nose signs and symptoms of a dangerous change in heartbeat or heart rhythm like chest pain; dizziness; fast, irregular heartbeat; palpitations; feeling faint or lightheaded; falls signs and symptoms of infection like fever; chills; cough; sore throat; pain or trouble passing urine signs and symptoms of liver injury like dark yellow or brown urine; general ill feeling or flu-like symptoms; light-colored stools; loss of appetite; nausea; right upper belly pain; unusually weak or tired; yellowing of the eyes or skin signs and symptoms of low red blood cells or anemia such as unusually weak or tired; feeling faint or lightheaded; falls signs and symptoms of muscle injury like dark urine; trouble passing urine or change in the amount of urine; unusually weak or tired; muscle pain; back pain Side effects that usually do not require medical attention (report to your doctor or health care professional if they continue or are bothersome): changes in taste diarrhea gas hair loss loss of appetite mouth sores This list may not describe all possible side effects. Call your doctor for medical advice about side effects. You may report side effects to FDA at 1-800-FDA-1088. Where should I keep my medicine? This drug is given in a hospital or clinic and will not be stored at home. NOTE: This sheet is a summary. It may not cover all possible information. If you have  questions about this medicine, talk to your doctor, pharmacist, or health care provider.  2021 Elsevier/Gold Standard (2018-11-04 12:20:35)       To help prevent nausea and vomiting after your treatment, we encourage you to take your nausea medication as directed.  BELOW ARE SYMPTOMS THAT SHOULD BE REPORTED IMMEDIATELY: . *FEVER GREATER THAN 100.4 F (38 C) OR HIGHER . *CHILLS OR SWEATING . *NAUSEA AND VOMITING THAT IS NOT CONTROLLED WITH YOUR NAUSEA MEDICATION . *UNUSUAL SHORTNESS OF BREATH . *UNUSUAL BRUISING OR BLEEDING . *URINARY PROBLEMS (pain or burning when urinating,  or frequent urination) . *BOWEL PROBLEMS (unusual diarrhea, constipation, pain near the anus) . TENDERNESS IN MOUTH AND THROAT WITH OR WITHOUT PRESENCE OF ULCERS (sore throat, sores in mouth, or a toothache) . UNUSUAL RASH, SWELLING OR PAIN  . UNUSUAL VAGINAL DISCHARGE OR ITCHING   Items with * indicate a potential emergency and should be followed up as soon as possible or go to the Emergency Department if any problems should occur.  Please show the CHEMOTHERAPY ALERT CARD or IMMUNOTHERAPY ALERT CARD at check-in to the Emergency Department and triage nurse.  Should you have questions after your visit or need to cancel or reschedule your appointment, please contact [facility]  [phone] and follow the prompts.  Office hours are 8:00 a.m. to 4:30 p.m. Monday - Friday. Please note that voicemails left after 4:00 p.m. may not be returned until the following business day.  We are closed weekends and major holidays. You have access to a nurse at all times for urgent questions. Please call the main number to the clinic [phone] and follow the prompts.  For any non-urgent questions, you may also contact your provider using MyChart. We now offer e-Visits for anyone 33 and older to request care online for non-urgent symptoms. For details visit [email or web address].   Also download  the MyChart app! Go to the app store, search "MyChart", open the app, select [facility], and log in with your MyChart username and password.  Due to Covid, a mask is required upon entering the hospital/clinic. If you do not have a mask, one will be given to you upon arrival. For doctor visits, patients may have 1 support person aged 9 or older with them. For treatment visits, patients cannot have anyone with them due to current Covid guidelines and our immunocompromised population.

## 2020-11-28 NOTE — Progress Notes (Signed)
Vitals reviewed with MD and treatment team. Per MD to continue with treatment. Pt and treatment team updated.   [facility]  

## 2020-11-30 ENCOUNTER — Inpatient Hospital Stay: Payer: Self-pay | Attending: Oncology

## 2020-11-30 DIAGNOSIS — Z5111 Encounter for antineoplastic chemotherapy: Secondary | ICD-10-CM | POA: Insufficient documentation

## 2020-11-30 DIAGNOSIS — C20 Malignant neoplasm of rectum: Secondary | ICD-10-CM | POA: Insufficient documentation

## 2020-11-30 DIAGNOSIS — Z79899 Other long term (current) drug therapy: Secondary | ICD-10-CM | POA: Insufficient documentation

## 2020-11-30 MED ORDER — HEPARIN SOD (PORK) LOCK FLUSH 100 UNIT/ML IV SOLN
500.0000 [IU] | Freq: Once | INTRAVENOUS | Status: AC | PRN
Start: 2020-11-30 — End: 2020-11-30
  Administered 2020-11-30: 500 [IU]
  Filled 2020-11-30: qty 5

## 2020-11-30 MED ORDER — SODIUM CHLORIDE 0.9% FLUSH
10.0000 mL | INTRAVENOUS | Status: DC | PRN
  Administered 2020-11-30: 10 mL
  Filled 2020-11-30: qty 10

## 2020-11-30 MED ORDER — HEPARIN SOD (PORK) LOCK FLUSH 100 UNIT/ML IV SOLN
INTRAVENOUS | Status: AC
  Filled 2020-11-30: qty 5

## 2020-11-30 NOTE — Patient Instructions (Signed)
[facility]  Discharge Instructions: Thank you for choosing [facility] to provide your oncology and hematology care.  If you have a lab appointment with the Cancer Center, please go directly to the Cancer Center and check in at the registration area.  Wear comfortable clothing and clothing appropriate for easy access to any Portacath or PICC line.   We strive to give you quality time with your provider. You may need to reschedule your appointment if you arrive late (15 or more minutes).  Arriving late affects you and other patients whose appointments are after yours.  Also, if you miss three or more appointments without notifying the office, you may be dismissed from the clinic at the provider's discretion.      For prescription refill requests, have your pharmacy contact our office and allow 72 hours for refills to be completed.      To help prevent nausea and vomiting after your treatment, we encourage you to take your nausea medication as directed.  BELOW ARE SYMPTOMS THAT SHOULD BE REPORTED IMMEDIATELY: *FEVER GREATER THAN 100.4 F (38 C) OR HIGHER *CHILLS OR SWEATING *NAUSEA AND VOMITING THAT IS NOT CONTROLLED WITH YOUR NAUSEA MEDICATION *UNUSUAL SHORTNESS OF BREATH *UNUSUAL BRUISING OR BLEEDING *URINARY PROBLEMS (pain or burning when urinating, or frequent urination) *BOWEL PROBLEMS (unusual diarrhea, constipation, pain near the anus) TENDERNESS IN MOUTH AND THROAT WITH OR WITHOUT PRESENCE OF ULCERS (sore throat, sores in mouth, or a toothache) UNUSUAL RASH, SWELLING OR PAIN  UNUSUAL VAGINAL DISCHARGE OR ITCHING   Items with * indicate a potential emergency and should be followed up as soon as possible or go to the Emergency Department if any problems should occur.  Please show the CHEMOTHERAPY ALERT CARD or IMMUNOTHERAPY ALERT CARD at check-in to the Emergency Department and triage nurse.  Should you have questions after your  visit or need to cancel or reschedule your appointment, please contact [facility]  [phone] and follow the prompts.  Office hours are 8:00 a.m. to 4:30 p.m. Monday - Friday. Please note that voicemails left after 4:00 p.m. may not be returned until the following business day.  We are closed weekends and major holidays. You have access to a nurse at all times for urgent questions. Please call the main number to the clinic [phone] and follow the prompts.  For any non-urgent questions, you may also contact your provider using MyChart. We now offer e-Visits for anyone 18 and older to request care online for non-urgent symptoms. For details visit [email or web address].   Also download the MyChart app! Go to the app store, search "MyChart", open the app, select [facility], and log in with your MyChart username and password.  Due to Covid, a mask is required upon entering the hospital/clinic. If you do not have a mask, one will be given to you upon arrival. For doctor visits, patients may have 1 support person aged 18 or older with them. For treatment visits, patients cannot have anyone with them due to current Covid guidelines and our immunocompromised population.  

## 2020-12-06 ENCOUNTER — Other Ambulatory Visit: Payer: Self-pay

## 2020-12-06 ENCOUNTER — Ambulatory Visit (INDEPENDENT_AMBULATORY_CARE_PROVIDER_SITE_OTHER): Payer: Self-pay | Admitting: Dermatology

## 2020-12-06 DIAGNOSIS — D229 Melanocytic nevi, unspecified: Secondary | ICD-10-CM

## 2020-12-06 DIAGNOSIS — D224 Melanocytic nevi of scalp and neck: Secondary | ICD-10-CM

## 2020-12-06 DIAGNOSIS — B360 Pityriasis versicolor: Secondary | ICD-10-CM

## 2020-12-06 MED ORDER — KETOCONAZOLE 2 % EX SHAM: 1.0000 "application " | MEDICATED_SHAMPOO | CUTANEOUS | 6 refills | Status: AC

## 2020-12-06 NOTE — Patient Instructions (Signed)

## 2020-12-06 NOTE — Progress Notes (Signed)
   New Patient Visit  Subjective  Malik Lynch is a 50 y.o. male who presents for the following: Rash (Of trunk that itches sometimes and he has tried several OTC products. Nothing has worked so far.).  Accompanied by wife who contributes to history.  The following portions of the chart were reviewed this encounter and updated as appropriate:   Tobacco  Allergies  Meds  Problems  Med Hx  Surg Hx  Fam Hx      Review of Systems:  No other skin or systemic complaints except as noted in HPI or Assessment and Plan.  Objective  Well appearing patient in no apparent distress; mood and affect are within normal limits.  All skin waist up examined.  Trunk Hyperpigmented patches  Neck - Posterior Brown macules/papules - some irritated.   Assessment & Plan  Tinea versicolor Trunk Chronic; recurrent Start Ketoconazole 2% shampoo 3 times per week wash from scalp down for 6 weeks then once monthly for prevention. Leave on for a few minutes then rinse.  ketoconazole (NIZORAL) 2 % shampoo - Trunk Apply 1 application topically as directed. 3 times per week wash from scalp down for 6 weeks then once monthly for prevention. Leave on for a few minutes then rinse.  Nevus Neck - Posterior  Benign appearing. Observe. Will recheck once Tinea Versicolor is under control.  Return in about 2 months (around 02/05/2021) for Follow up.  I, Ashok Cordia, CMA, am acting as scribe for Sarina Ser, MD .  Documentation: I have reviewed the above documentation for accuracy and completeness, and I agree with the above.  Sarina Ser, MD

## 2020-12-06 NOTE — Progress Notes (Deleted)
   Follow-Up Visit   Subjective  Malik Lynch is a 50 y.o. male who presents for the following: Rash (Of trunk that itches sometimes and he has tried several OTC products. Nothing has worked so far.).  Accompanied by wife  The following portions of the chart were reviewed this encounter and updated as appropriate:       Review of Systems:  No other skin or systemic complaints except as noted in HPI or Assessment and Plan.  Objective  Well appearing patient in no apparent distress; mood and affect are within normal limits.  All skin waist up examined.    Assessment & Plan   No follow-ups on file.  I, Ashok Cordia, CMA, am acting as scribe for Sarina Ser, MD .

## 2020-12-07 ENCOUNTER — Encounter: Payer: Self-pay | Admitting: Dermatology

## 2020-12-08 NOTE — Progress Notes (Signed)
[place]  Telephone:([phone] Fax:(336) [phone]  ID: Malik Lynch OB: 28-Apr-1971  MR#: [id number]  AOZ#:[id number]  Patient Care Team: Leonard Downing, MD as PCP - General (Family Medicine) [name], RN as Oncology Nurse Navigator  CHIEF COMPLAINT: Stage IIIb rectal cancer.  INTERVAL HISTORY: Patient returns to clinic today for further evaluation and consideration of cycle 6 of neoadjuvant FOLFOX.  He continues to have pain, but does not wish to change his narcotic regimen at this time.  He continues to intermittently self caths for urination, but states this is improved. He has chronic weakness and fatigue. He has no neurologic complaints. He denies any recent fevers or illnesses. He has no chest pain, shortness of breath, cough, or hemoptysis.  He denies any nausea, vomiting, constipation, or diarrhea.  Patient offers no further specific complaints today.  REVIEW OF SYSTEMS:   Review of Systems  Constitutional:  Positive for malaise/fatigue. Negative for fever and weight loss.  Respiratory: Negative.  Negative for cough, hemoptysis and shortness of breath.   Cardiovascular: Negative.  Negative for chest pain and leg swelling.  Gastrointestinal:  Positive for abdominal pain. Negative for blood in stool, diarrhea and melena.  Genitourinary: Negative.  Negative for urgency.  Musculoskeletal: Negative.  Negative for back pain.  Skin: Negative.  Negative for rash.  Neurological:  Positive for weakness. Negative for dizziness, focal weakness and headaches.  Psychiatric/Behavioral:  The patient has insomnia. The patient is not nervous/anxious.    As per HPI. Otherwise, a complete review of systems is negative.  PAST MEDICAL HISTORY: Past Medical History:  Diagnosis Date   Family history of breast cancer    Family history of colon cancer    Family history of kidney cancer    Family history of prostate cancer    Family history of uterine  cancer     PAST SURGICAL HISTORY: Past Surgical History:  Procedure Laterality Date   IR IMAGING GUIDED PORT INSERTION  09/29/2020    FAMILY HISTORY: Family History  Problem Relation Age of Onset   Throat cancer Mother 60   Lymphoma Maternal Aunt    Kidney cancer Maternal Uncle    Breast cancer Paternal Aunt    Colon cancer Paternal Uncle    Lung cancer Maternal Grandmother    Cancer Maternal Grandmother        mouth   Prostate cancer Maternal Grandfather        dx 53s   Colon cancer Maternal Grandfather        dx 80s   Lung cancer Maternal Uncle    Cancer Maternal Uncle        possibly kidney   Uterine cancer Paternal Aunt    Cancer Paternal Aunt        reproductive   Kidney cancer Paternal Uncle    Breast cancer Cousin    Breast cancer Cousin    Colon cancer Cousin        dx 51s    ADVANCED DIRECTIVES (Y/N):  N  HEALTH MAINTENANCE: Social History   Tobacco Use   Smoking status: Some Days    Packs/day: 1.00    Pack years: 0.00    Types: Cigarettes   Smokeless tobacco: Never  Vaping Use   Vaping Use: Never used  Substance Use Topics   Alcohol use: Yes    Alcohol/week: 1.0 standard drink    Types: 1 Cans of beer per week   Drug use: No     Colonoscopy:  PAP:  Bone density:  Lipid panel:  No Known Allergies  Current Outpatient Medications  Medication Sig Dispense Refill   ALPRAZolam (XANAX) 0.5 MG tablet TAKE 1 TABLET BY MOUTH 2 TIMES DAILY AS NEEDED FOR ANXIETY. 60 tablet 0   cyclobenzaprine (FLEXERIL) 10 MG tablet Take 1 tablet (10 mg total) by mouth 3 (three) times daily as needed for muscle spasms. 60 tablet 0   ibuprofen (ADVIL,MOTRIN) 100 MG tablet Take 800 mg by mouth every 6 (six) hours as needed.     ketoconazole (NIZORAL) 2 % shampoo Apply 1 application topically as directed. 3 times per week wash from scalp down for 6 weeks then once monthly for prevention. Leave on for a few minutes then rinse. 120 mL 6   lactulose (CHRONULAC) 10 GM/15ML  solution Take 15-30 mLs (10-20 g total) by mouth 2 (two) times daily as needed for severe constipation. 236 mL 0   lidocaine-prilocaine (EMLA) cream Apply to affected area once 30 g 3   loperamide (IMODIUM A-D) 2 MG tablet Take 2 mg by mouth 4 (four) times daily as needed for diarrhea or loose stools.     ondansetron (ZOFRAN) 8 MG tablet Take 1 tablet (8 mg total) by mouth every 8 (eight) hours as needed for nausea or vomiting. 45 tablet 0   oxyCODONE (OXYCONTIN) 15 mg 12 hr tablet Take 1 tablet (15 mg total) by mouth every 8 (eight) hours. 90 tablet 0   oxyCODONE-acetaminophen (PERCOCET/ROXICET) 5-325 MG tablet TAKE 1-2 TABLETS BY MOUTH EVERY 4 HOURS AS NEEDED FOR SEVERE PAIN. 90 tablet 0   polyethylene glycol (MIRALAX / GLYCOLAX) 17 g packet Take 17 g by mouth daily.     prochlorperazine (COMPAZINE) 10 MG tablet TAKE ONE TABLET BY MOUTH EVERY 6 HOURS AS NEEDED FOR NAUSEA / VOMITING 60 tablet 1   senna (SENOKOT) 8.6 MG TABS tablet Take 1 tablet (8.6 mg total) by mouth daily. 120 tablet 3   Sennosides-Docusate Sodium 8.6-50 MG CAPS Take 1 tablet by mouth as needed for constipation.     tamsulosin (FLOMAX) 0.4 MG CAPS capsule Take 1 capsule (0.4 mg total) by mouth daily. 90 capsule 2   No current facility-administered medications for this visit.   Facility-Administered Medications Ordered in Other Visits  Medication Dose Route Frequency Provider Last Rate Last Admin   fluorouracil (ADRUCIL) 4,800 mg in sodium chloride 0.9 % 54 mL chemo infusion  2,400 mg/m2 (Treatment Plan Recorded) Intravenous 1 day or 1 dose Lloyd Huger, MD   4,800 mg at 12/12/20 1315   fluorouracil (ADRUCIL) chemo injection 800 mg  400 mg/m2 (Treatment Plan Recorded) Intravenous Once Lloyd Huger, MD   800 mg at 12/12/20 1310   sodium chloride flush (NS) 0.9 % injection 10 mL  10 mL Intravenous PRN Lloyd Huger, MD   10 mL at 12/12/20 0854    OBJECTIVE: Vitals:   12/12/20 0922  BP: 121/87  Pulse: (!)  101  Resp: 18  Temp: 97.6 F (36.4 C)     Body mass index is 22.08 kg/m.    ECOG FS:1 - Symptomatic but completely ambulatory   General: Well-developed, well-nourished, no acute distress. Eyes: Pink conjunctiva, anicteric sclera. HEENT: Normocephalic, moist mucous membranes. Lungs: No audible wheezing or coughing. Heart: Regular rate and rhythm. Abdomen: Soft, nontender, no obvious distention. Musculoskeletal: No edema, cyanosis, or clubbing. Neuro: Alert, answering all questions appropriately. Cranial nerves grossly intact. Skin: No rashes or petechiae noted. Psych: Normal affect.   LAB RESULTS:  Lab Results  Component Value Date   NA 134 (L) 12/12/2020   K 3.9 12/12/2020   CL 100 12/12/2020   CO2 27 12/12/2020   GLUCOSE 103 (H) 12/12/2020   BUN 9 12/12/2020   CREATININE 0.82 12/12/2020   CALCIUM 9.0 12/12/2020   PROT 6.9 12/12/2020   ALBUMIN 3.4 (L) 12/12/2020   AST 21 12/12/2020   ALT 9 12/12/2020   ALKPHOS 84 12/12/2020   BILITOT 0.5 12/12/2020   GFRNONAA >60 12/12/2020    Lab Results  Component Value Date   WBC 4.7 12/12/2020   NEUTROABS 3.4 12/12/2020   HGB 11.9 (L) 12/12/2020   HCT 34.4 (L) 12/12/2020   MCV 97.2 12/12/2020   PLT 91 (L) 12/12/2020     STUDIES: No results found.  ASSESSMENT: Stage IIIb rectal cancer.  PLAN:    1.  Stage IIIb rectal cancer: Pathology and imaging results reviewed independently.  EUS completed at outside facility confirmed stage of disease.  PET scan results from June 28, 2020 reviewed independently with no obvious malignancy outside of patient's known rectal mass.  Patient completed his neoadjuvant treatment with chemotherapy and XRT on August 17, 2020.  His recent appointment with [facility] GI surgery recommended continued neoadjuvant treatment with FOLFOX prior to surgical resection.  Patient does not wish to use capecitabine any further.  Despite thrombocytopenia, will proceed with cycle 6 of 8 of neoadjuvant FOLFOX  today.  Return to clinic in 2 days for pump removal and then in 2 weeks for further evaluation and consideration of cycle 7.  Patient also reports he has repeat imaging at [facility] on June 30 with follow-up appointment with surgery soon thereafter. 2.  Weight loss: Patient continues to have an improved appetite.  Appreciate dietary input. 3.  Pain: Improved.  Chronic and unchanged.  He does not wish to change his narcotic regimen at this time.  Patient states he takes 15 mg of OxyContin every 8 hours and only 1-2 Percocets for breakthrough pain.  Appreciate palliative care input. 4.  Left kidney mass: 2.8 x 3.6 x 3.4 mass highly concerning for a second malignancy.  Patient will likely have to undergo partial nephrectomy at the time of his rectal surgery.  He has had consultation with urology at [facility]. 5.  Urinary retention: [place] urology input.  Significantly improved.  Patient states frequency of self-catheterization has decreased.   6.  Diarrhea: Resolved. 7.  Hypokalemia: Resolved. 8.  Anxiety/insomnia: Chronic and unchanged. Continue alprazolam as needed.  Appreciate palliative care input. 9.  Rash: Patient does not complain of this today.  Consider dermatology referral in the future. 10.  Transaminitis: Resolved. 11.  Thrombocytopenia: Proceed cautiously with treatment as above.  His next treatment will be delayed 1 week.  Return to clinic in 3 weeks as above. 12.  Hyponatremia: Chronic and unchanged.  Patient's sodium levels are 134 today.  Patient expressed understanding and was in agreement with this plan. He also understands that He can call clinic at any time with any questions, concerns, or complaints.   Cancer Staging Rectal cancer [facility]) Staging form: Colon and Rectum, AJCC 8th Edition - Clinical stage from 06/22/2020: Stage IIIB (cT3, cN1a, cM0) - Signed by Lloyd Huger, MD on 06/22/2020 Stage prefix: Initial diagnosis Total positive nodes: 1   Lloyd Huger, MD    12/12/2020 1:11 PM

## 2020-12-11 ENCOUNTER — Other Ambulatory Visit: Payer: Self-pay | Admitting: Oncology

## 2020-12-11 ENCOUNTER — Encounter: Payer: Self-pay | Admitting: Oncology

## 2020-12-11 ENCOUNTER — Other Ambulatory Visit: Payer: Self-pay | Admitting: *Deleted

## 2020-12-11 MED ORDER — CYCLOBENZAPRINE HCL 10 MG PO TABS: 10.0000 mg | ORAL_TABLET | Freq: Three times a day (TID) | ORAL | 0 refills | Status: DC | PRN

## 2020-12-11 MED ORDER — OXYCODONE-ACETAMINOPHEN 5-325 MG PO TABS: ORAL_TABLET | ORAL | 0 refills | Status: DC

## 2020-12-12 ENCOUNTER — Inpatient Hospital Stay: Payer: Self-pay

## 2020-12-12 ENCOUNTER — Inpatient Hospital Stay (HOSPITAL_BASED_OUTPATIENT_CLINIC_OR_DEPARTMENT_OTHER): Payer: Self-pay | Admitting: Oncology

## 2020-12-12 ENCOUNTER — Encounter: Payer: Self-pay | Admitting: Oncology

## 2020-12-12 ENCOUNTER — Other Ambulatory Visit: Payer: Self-pay

## 2020-12-12 VITALS — BP 121/87 | HR 101 | Temp 97.6°F | Resp 18 | Wt 172.0 lb

## 2020-12-12 DIAGNOSIS — C20 Malignant neoplasm of rectum: Secondary | ICD-10-CM

## 2020-12-12 LAB — COMPREHENSIVE METABOLIC PANEL
ALT: 9 U/L (ref 0–44)
AST: 21 U/L (ref 15–41)
Albumin: 3.4 g/dL — ABNORMAL LOW (ref 3.5–5.0)
Alkaline Phosphatase: 84 U/L (ref 38–126)
Anion gap: 7 (ref 5–15)
BUN: 9 mg/dL (ref 6–20)
CO2: 27 mmol/L (ref 22–32)
Calcium: 9 mg/dL (ref 8.9–10.3)
Chloride: 100 mmol/L (ref 98–111)
Creatinine, Ser: 0.82 mg/dL (ref 0.61–1.24)
GFR, Estimated: >60 mL/min (ref >60)
Glucose, Bld: 103 mg/dL — ABNORMAL HIGH (ref 70–99)
Potassium: 3.9 mmol/L (ref 3.5–5.1)
Sodium: 134 mmol/L — ABNORMAL LOW (ref 135–145)
Total Bilirubin: 0.5 mg/dL (ref 0.3–1.2)
Total Protein: 6.9 g/dL (ref 6.5–8.1)

## 2020-12-12 LAB — CBC WITH DIFFERENTIAL/PLATELET
Abs Immature Granulocytes: 0.03 10*3/uL (ref 0.00–0.07)
Basophils Absolute: 0 10*3/uL (ref 0.0–0.1)
Basophils Relative: 1 %
Eosinophils Absolute: 0.1 10*3/uL (ref 0.0–0.5)
Eosinophils Relative: 3 %
HCT: 34.4 % — ABNORMAL LOW (ref 39.0–52.0)
Hemoglobin: 11.9 g/dL — ABNORMAL LOW (ref 13.0–17.0)
Immature Granulocytes: 1 %
Lymphocytes Relative: 12 %
Lymphs Abs: 0.6 10*3/uL — ABNORMAL LOW (ref 0.7–4.0)
MCH: 33.6 pg (ref 26.0–34.0)
MCHC: 34.6 g/dL (ref 30.0–36.0)
MCV: 97.2 fL (ref 80.0–100.0)
Monocytes Absolute: 0.6 10*3/uL (ref 0.1–1.0)
Monocytes Relative: 12 %
Neutro Abs: 3.4 10*3/uL (ref 1.7–7.7)
Neutrophils Relative %: 71 %
Platelets: 91 10*3/uL — ABNORMAL LOW (ref 150–400)
RBC: 3.54 MIL/uL — ABNORMAL LOW (ref 4.22–5.81)
RDW: 16.7 % — ABNORMAL HIGH (ref 11.5–15.5)
WBC: 4.7 10*3/uL (ref 4.0–10.5)
nRBC: 0 % (ref 0.0–0.2)

## 2020-12-12 MED ORDER — LEUCOVORIN CALCIUM INJECTION 350 MG
402.0000 mg/m2 | Freq: Once | INTRAVENOUS | Status: AC
  Administered 2020-12-12: 800 mg via INTRAVENOUS
  Filled 2020-12-12: qty 25

## 2020-12-12 MED ORDER — SODIUM CHLORIDE 0.9 % IV SOLN
2400.0000 mg/m2 | INTRAVENOUS | Status: DC
  Administered 2020-12-12: 4800 mg via INTRAVENOUS
  Filled 2020-12-12: qty 96

## 2020-12-12 MED ORDER — PALONOSETRON HCL INJECTION 0.25 MG/5ML
0.2500 mg | Freq: Once | INTRAVENOUS | Status: AC
  Administered 2020-12-12: 0.25 mg via INTRAVENOUS
  Filled 2020-12-12: qty 5

## 2020-12-12 MED ORDER — SODIUM CHLORIDE 0.9% FLUSH
10.0000 mL | INTRAVENOUS | Status: DC | PRN
  Administered 2020-12-12: 10 mL via INTRAVENOUS
  Filled 2020-12-12: qty 10

## 2020-12-12 MED ORDER — DEXTROSE 5 % IV SOLN
Freq: Once | INTRAVENOUS | Status: AC
Start: 2020-12-12 — End: 2020-12-12
  Filled 2020-12-12: qty 250

## 2020-12-12 MED ORDER — FLUOROURACIL CHEMO INJECTION 2.5 GM/50ML
400.0000 mg/m2 | Freq: Once | INTRAVENOUS | Status: AC
  Administered 2020-12-12: 800 mg via INTRAVENOUS
  Filled 2020-12-12: qty 16

## 2020-12-12 MED ORDER — OXALIPLATIN CHEMO INJECTION 100 MG/20ML
85.0000 mg/m2 | Freq: Once | INTRAVENOUS | Status: AC
  Administered 2020-12-12: 170 mg via INTRAVENOUS
  Filled 2020-12-12: qty 34

## 2020-12-12 MED ORDER — SODIUM CHLORIDE 0.9 % IV SOLN
10.0000 mg | Freq: Once | INTRAVENOUS | Status: AC
  Administered 2020-12-12: 10 mg via INTRAVENOUS
  Filled 2020-12-12: qty 10

## 2020-12-12 NOTE — Progress Notes (Signed)
Pt in for follow up, asking if he is a candidate for Dostarlimab study.

## 2020-12-12 NOTE — Progress Notes (Signed)
Nutrition Follow-up:    Patient with rectal cancer.  Patient has completed radiation.  Patient receiving chemotherapy.    Met with patient during infusion.  Patient reports that appetite is pretty good.  Taste is still off.  Reports mostly eating soups, cereal, ate cheeseburger last night and fruit.  Wife continues to prepare meals for patient to eat.    Medications: reviewed  Labs: reviewed  Anthropometrics:   Weight 172 lb today increased  168 lb on 5/17 169 lb on 5/3 170 lb on 4/19 172 lb on 3/24 175 lb on 3/3 170 lb on 2/15   NUTRITION DIAGNOSIS: Inadequate oral intake improved, weight increase    INTERVENTION:  Continue to push calories and protein in preparation for surgery     MONITORING, EVALUATION, GOAL: weight trends, intake   NEXT VISIT: as needed  Teckla Christiansen B. Zenia Resides, [place], [place] Registered Dietitian [phone] (mobile)

## 2020-12-12 NOTE — Progress Notes (Signed)
HR 101 & Plt 91. Per Tillie Rung, RN, Per Dr. Grayland Ormond, okay to proceed with treatment.

## 2020-12-14 ENCOUNTER — Other Ambulatory Visit: Payer: Self-pay

## 2020-12-14 ENCOUNTER — Inpatient Hospital Stay: Payer: Self-pay

## 2020-12-14 DIAGNOSIS — C20 Malignant neoplasm of rectum: Secondary | ICD-10-CM

## 2020-12-14 MED ORDER — HEPARIN SOD (PORK) LOCK FLUSH 100 UNIT/ML IV SOLN
500.0000 [IU] | Freq: Once | INTRAVENOUS | Status: AC | PRN
  Administered 2020-12-14: 500 [IU]
  Filled 2020-12-14: qty 5

## 2020-12-14 MED ORDER — HEPARIN SOD (PORK) LOCK FLUSH 100 UNIT/ML IV SOLN
INTRAVENOUS | Status: AC
  Filled 2020-12-14: qty 5

## 2020-12-14 MED ORDER — SODIUM CHLORIDE 0.9% FLUSH
10.0000 mL | INTRAVENOUS | Status: DC | PRN
  Administered 2020-12-14: 10 mL
  Filled 2020-12-14: qty 10

## 2020-12-22 ENCOUNTER — Other Ambulatory Visit: Payer: Self-pay | Admitting: *Deleted

## 2020-12-22 MED ORDER — OXYCODONE HCL ER 15 MG PO T12A: 15.0000 mg | EXTENDED_RELEASE_TABLET | Freq: Three times a day (TID) | ORAL | 0 refills | Status: DC

## 2020-12-22 MED ORDER — OXYCODONE-ACETAMINOPHEN 5-325 MG PO TABS: ORAL_TABLET | ORAL | 0 refills | Status: DC

## 2020-12-26 ENCOUNTER — Encounter: Payer: Self-pay | Admitting: Oncology

## 2021-01-02 ENCOUNTER — Inpatient Hospital Stay: Payer: Self-pay

## 2021-01-02 ENCOUNTER — Inpatient Hospital Stay: Payer: Self-pay | Admitting: Oncology

## 2021-01-04 ENCOUNTER — Inpatient Hospital Stay: Payer: Self-pay

## 2021-01-04 ENCOUNTER — Inpatient Hospital Stay: Payer: Self-pay | Attending: Oncology | Admitting: Hospice and Palliative Medicine

## 2021-01-04 ENCOUNTER — Encounter: Payer: Self-pay | Admitting: Oncology

## 2021-01-04 DIAGNOSIS — Z515 Encounter for palliative care: Secondary | ICD-10-CM

## 2021-01-04 DIAGNOSIS — C20 Malignant neoplasm of rectum: Secondary | ICD-10-CM

## 2021-01-04 MED ORDER — ALPRAZOLAM 0.5 MG PO TABS: 0.5000 mg | ORAL_TABLET | Freq: Two times a day (BID) | ORAL | 0 refills | Status: DC | PRN

## 2021-01-04 MED ORDER — CYCLOBENZAPRINE HCL 10 MG PO TABS: 10.0000 mg | ORAL_TABLET | Freq: Three times a day (TID) | ORAL | 0 refills | Status: DC | PRN

## 2021-01-04 NOTE — Progress Notes (Signed)
Virtual Visit via Telephone Note  I connected with Malik Lynch on 01/04/21 at  2:15 PM EDT by telephone and verified that I am speaking with the correct person using two identifiers.  Location: Patient: Home Provider: Clinic   I discussed the limitations, risks, security and privacy concerns of performing an evaluation and management service by telephone and the availability of in person appointments. I also discussed with the patient that there may be a patient responsible charge related to this service. The patient expressed understanding and agreed to proceed.   History of Present Illness: Malik Lynch is a 50 y.o. male with multiple medical problems including stage IIIb rectal cancer s/p XRT and neoadjuvant chemotherapy with capecitabine.  Patient has had pain, weight loss, and anxiety.  He was referred to palliative care to help address goals and manage ongoing symptoms.   Observations/Objective: Spoke with patient by phone.  He was seen earlier today by the surgeon at [facility] with plan to operate in the next couple of weeks.  Patient was somewhat dismayed by the news that he would require a colostomy bag.  However, his goals are still aligned with continued aggressive care.  Patient reports that he is symptomatically doing well.  Pain is well controlled on OxyContin every 8 hours.  Patient reports that he occasionally has to take Percocet for breakthrough pain but not frequently.  He reports stable anxiety.  Patient requested refills of alprazolam and Flexeril.  Assessment and Plan: Rectal cancer -pending surgical resection at [facility].  Follow-up with Dr. Grayland Ormond TBD.  Symptomatically stable.  Refill alprazolam and Flexeril.  Follow Up Instructions: Follow-up MyChart visit 1 to 2 months   I discussed the assessment and treatment plan with the patient. The patient was provided an opportunity to ask questions and all were answered. The patient agreed with the plan and  demonstrated an understanding of the instructions.   The patient was advised to call back or seek an in-person evaluation if the symptoms worsen or if the condition fails to improve as anticipated.  I provided 10 minutes of non-face-to-face time during this encounter.   Irean Hong, NP

## 2021-01-15 ENCOUNTER — Encounter: Payer: Self-pay | Admitting: Oncology

## 2021-01-22 ENCOUNTER — Other Ambulatory Visit: Payer: Self-pay | Admitting: Oncology

## 2021-01-29 ENCOUNTER — Telehealth: Payer: Self-pay | Admitting: *Deleted

## 2021-01-29 NOTE — Telephone Encounter (Signed)
Malik Lynch called asking if there is any way that patient can have appointment either in person or video with Malik Lynch whom they trust and feel very comfortable with to discuss his case and events that are going on at [facility]. He is scheduled for another surgery on 02/06/21 and would like to see Malik Lynch prior to surgery. They do not feel that he is being treated as a person, but more like a study figure and that what they get told is not what happens. She would like for this appointment to be this week if at all possible. Please advise

## 2021-02-01 ENCOUNTER — Inpatient Hospital Stay: Payer: Self-pay | Admitting: Oncology

## 2021-02-02 ENCOUNTER — Other Ambulatory Visit: Payer: Self-pay | Admitting: *Deleted

## 2021-02-02 MED ORDER — CYCLOBENZAPRINE HCL 10 MG PO TABS: 10.0000 mg | ORAL_TABLET | Freq: Three times a day (TID) | ORAL | 0 refills | Status: DC | PRN

## 2021-02-05 ENCOUNTER — Encounter: Payer: Self-pay | Admitting: Oncology

## 2021-02-05 ENCOUNTER — Other Ambulatory Visit: Payer: Self-pay | Admitting: *Deleted

## 2021-02-05 MED ORDER — ALPRAZOLAM 0.5 MG PO TABS: 0.5000 mg | ORAL_TABLET | Freq: Two times a day (BID) | ORAL | 0 refills | Status: DC | PRN

## 2021-02-08 ENCOUNTER — Inpatient Hospital Stay: Payer: Self-pay | Admitting: Oncology

## 2021-02-22 ENCOUNTER — Ambulatory Visit: Payer: Self-pay | Admitting: Oncology

## 2021-02-26 ENCOUNTER — Other Ambulatory Visit: Payer: Self-pay | Admitting: *Deleted

## 2021-02-26 MED ORDER — OXYCODONE HCL ER 15 MG PO T12A: 15.0000 mg | EXTENDED_RELEASE_TABLET | Freq: Three times a day (TID) | ORAL | 0 refills | Status: DC

## 2021-03-02 NOTE — Progress Notes (Signed)
[place]  Telephone:(336) [phone] Fax:(336) [phone]  ID: ARSHIA EGNEW OB: 12-06-1970  MR#: [id number]  [id number]  Patient Care Team: Leonard Downing, MD as PCP - General (Family Medicine) [name], RN as Oncology Nurse Navigator  CHIEF COMPLAINT: Pathologic stage IIa rectal cancer, stage I clear cell carcinoma kidney.  INTERVAL HISTORY: Patient returns to clinic today postoperatively for further evaluation.  He recently underwent resection with APR as well as partial nephrectomy at [facility] and tolerated the procedure well.  His wound continues to heal causing some discomfort, but patient states he is improving on a weekly basis.  He continues to have pelvic pain, but this is significantly improved.  He no longer requires self-catheterization.  He has increased weakness and fatigue.   He has no neurologic complaints. He denies any recent fevers or illnesses. He has no chest pain, shortness of breath, cough, or hemoptysis.  He denies any nausea, vomiting, constipation, or diarrhea.  Patient offers no further specific complaints today.  REVIEW OF SYSTEMS:   Review of Systems  Constitutional:  Positive for malaise/fatigue. Negative for fever and weight loss.  Respiratory: Negative.  Negative for cough, hemoptysis and shortness of breath.   Cardiovascular: Negative.  Negative for chest pain and leg swelling.  Gastrointestinal:  Negative for abdominal pain, blood in stool, diarrhea and melena.  Genitourinary: Negative.  Negative for urgency.  Musculoskeletal: Negative.  Negative for back pain.  Skin: Negative.  Negative for rash.  Neurological:  Positive for weakness. Negative for dizziness, focal weakness and headaches.  Psychiatric/Behavioral:  The patient is not nervous/anxious and does not have insomnia.    As per HPI. Otherwise, a complete review of systems is negative.  PAST MEDICAL HISTORY: Past Medical History:  Diagnosis Date    Family history of breast cancer    Family history of colon cancer    Family history of kidney cancer    Family history of prostate cancer    Family history of uterine cancer     PAST SURGICAL HISTORY: Past Surgical History:  Procedure Laterality Date   IR IMAGING GUIDED PORT INSERTION  09/29/2020    FAMILY HISTORY: Family History  Problem Relation Age of Onset   Throat cancer Mother 77   Lymphoma Maternal Aunt    Kidney cancer Maternal Uncle    Breast cancer Paternal Aunt    Colon cancer Paternal Uncle    Lung cancer Maternal Grandmother    Cancer Maternal Grandmother        mouth   Prostate cancer Maternal Grandfather        dx 74s   Colon cancer Maternal Grandfather        dx 80s   Lung cancer Maternal Uncle    Cancer Maternal Uncle        possibly kidney   Uterine cancer Paternal Aunt    Cancer Paternal Aunt        reproductive   Kidney cancer Paternal Uncle    Breast cancer Cousin    Breast cancer Cousin    Colon cancer Cousin        dx 56s    ADVANCED DIRECTIVES (Y/N):  N  HEALTH MAINTENANCE: Social History   Tobacco Use   Smoking status: Some Days    Packs/day: 1.00    Types: Cigarettes   Smokeless tobacco: Never  Vaping Use   Vaping Use: Never used  Substance Use Topics   Alcohol use: Yes    Alcohol/week: 1.0 standard  drink    Types: 1 Cans of beer per week   Drug use: No     Colonoscopy:  PAP:  Bone density:  Lipid panel:  No Known Allergies  Current Outpatient Medications  Medication Sig Dispense Refill   ALPRAZolam (XANAX) 0.5 MG tablet Take 1 tablet (0.5 mg total) by mouth 2 (two) times daily as needed for anxiety. 60 tablet 0   cyclobenzaprine (FLEXERIL) 10 MG tablet Take 1 tablet (10 mg total) by mouth 3 (three) times daily as needed for muscle spasms. 60 tablet 0   ibuprofen (ADVIL,MOTRIN) 100 MG tablet Take 800 mg by mouth every 6 (six) hours as needed.     ketoconazole (NIZORAL) 2 % shampoo Apply 1 application topically as  directed. 3 times per week wash from scalp down for 6 weeks then once monthly for prevention. Leave on for a few minutes then rinse. 120 mL 6   lactulose (CHRONULAC) 10 GM/15ML solution Take 15-30 mLs (10-20 g total) by mouth 2 (two) times daily as needed for severe constipation. 236 mL 0   lidocaine-prilocaine (EMLA) cream Apply to affected area once 30 g 3   loperamide (IMODIUM A-D) 2 MG tablet Take 2 mg by mouth 4 (four) times daily as needed for diarrhea or loose stools.     ondansetron (ZOFRAN) 8 MG tablet Take 1 tablet (8 mg total) by mouth every 8 (eight) hours as needed for nausea or vomiting. 45 tablet 0   oxyCODONE (OXYCONTIN) 15 mg 12 hr tablet Take 1 tablet (15 mg total) by mouth every 8 (eight) hours. 90 tablet 0   oxyCODONE-acetaminophen (PERCOCET/ROXICET) 5-325 MG tablet TAKE 1-2 TABLETS BY MOUTH EVERY 4 HOURS AS NEEDED FOR SEVERE PAIN. 90 tablet 0   polyethylene glycol (MIRALAX / GLYCOLAX) 17 g packet Take 17 g by mouth daily.     prochlorperazine (COMPAZINE) 10 MG tablet TAKE ONE TABLET BY MOUTH EVERY 6 HOURS AS NEEDED FOR NAUSEA / VOMITING 60 tablet 1   senna (SENOKOT) 8.6 MG TABS tablet Take 1 tablet (8.6 mg total) by mouth daily. 120 tablet 3   Sennosides-Docusate Sodium 8.6-50 MG CAPS Take 1 tablet by mouth as needed for constipation.     tamsulosin (FLOMAX) 0.4 MG CAPS capsule Take 1 capsule (0.4 mg total) by mouth daily. 90 capsule 2   No current facility-administered medications for this visit.    OBJECTIVE: Vitals:   03/08/21 1011  BP: (!) 158/72  Pulse: 84  Resp: 16  Temp: 98.7 F (37.1 C)     Body mass index is 21.39 kg/m.    ECOG FS:1 - Symptomatic but completely ambulatory   General: Well-developed, well-nourished, no acute distress. Eyes: Pink conjunctiva, anicteric sclera. HEENT: Normocephalic, moist mucous membranes. Lungs: No audible wheezing or coughing. Heart: Regular rate and rhythm. Abdomen: Soft, nontender, no obvious distention.  Colostomy  noted. Musculoskeletal: No edema, cyanosis, or clubbing. Neuro: Alert, answering all questions appropriately. Cranial nerves grossly intact. Skin: No rashes or petechiae noted. Psych: Normal affect.  LAB RESULTS:  Lab Results  Component Value Date   NA 134 (L) 12/12/2020   K 3.9 12/12/2020   CL 100 12/12/2020   CO2 27 12/12/2020   GLUCOSE 103 (H) 12/12/2020   BUN 9 12/12/2020   CREATININE 0.82 12/12/2020   CALCIUM 9.0 12/12/2020   PROT 6.9 12/12/2020   ALBUMIN 3.4 (L) 12/12/2020   AST 21 12/12/2020   ALT 9 12/12/2020   ALKPHOS 84 12/12/2020   BILITOT 0.5 12/12/2020  GFRNONAA >60 12/12/2020    Lab Results  Component Value Date   WBC 4.7 12/12/2020   NEUTROABS 3.4 12/12/2020   HGB 11.9 (L) 12/12/2020   HCT 34.4 (L) 12/12/2020   MCV 97.2 12/12/2020   PLT 91 (L) 12/12/2020     STUDIES: No results found.  ASSESSMENT: Pathologic stage IIa rectal cancer, stage I clear cell carcinoma kidney.   PLAN:    1.  Pathologic stage IIa rectal cancer:  Patient completed his neoadjuvant treatment with chemotherapy and XRT on August 17, 2020.  He subsequently underwent treatment with chemotherapy only using FOLFOX and completed 6 cycles on December 12, 2020.  He underwent surgical resection on February 05, 2021.  No further intervention is needed.  Will reimage with CT scan in early November or 3 months after his resection.  Return to clinic 1 to 2 days later to discuss the results.   2.  Weight loss: Improving. 3.  Pain: Improved.   4.  Stage I clear cell carcinoma: Patient is status postresection on February 05, 2021.  He does not require adjuvant treatment. 5.  Urinary retention: Resolved.   6.  Anxiety: Continue Xanax as needed.   Patient expressed understanding and was in agreement with this plan. He also understands that He can call clinic at any time with any questions, concerns, or complaints.   Cancer Staging Rectal cancer [facility]) Staging form: Colon and Rectum, AJCC 8th  Edition - Clinical stage from 06/22/2020: Stage IIIB (cT3, cN1a, cM0) - Signed by Lloyd Huger, MD on 06/22/2020 Stage prefix: Initial diagnosis Total positive nodes: 1   Lloyd Huger, MD   03/10/2021 6:55 AM

## 2021-03-08 ENCOUNTER — Encounter: Payer: Self-pay | Admitting: Oncology

## 2021-03-08 ENCOUNTER — Inpatient Hospital Stay (HOSPITAL_BASED_OUTPATIENT_CLINIC_OR_DEPARTMENT_OTHER): Payer: Self-pay | Admitting: Oncology

## 2021-03-08 ENCOUNTER — Inpatient Hospital Stay: Payer: Self-pay | Attending: Oncology | Admitting: Hospice and Palliative Medicine

## 2021-03-08 VITALS — BP 158/72 | HR 84 | Temp 98.7°F | Resp 16 | Wt 166.6 lb

## 2021-03-08 DIAGNOSIS — C649 Malignant neoplasm of unspecified kidney, except renal pelvis: Secondary | ICD-10-CM | POA: Insufficient documentation

## 2021-03-08 DIAGNOSIS — F17210 Nicotine dependence, cigarettes, uncomplicated: Secondary | ICD-10-CM | POA: Insufficient documentation

## 2021-03-08 DIAGNOSIS — Z79899 Other long term (current) drug therapy: Secondary | ICD-10-CM | POA: Insufficient documentation

## 2021-03-08 DIAGNOSIS — C20 Malignant neoplasm of rectum: Secondary | ICD-10-CM | POA: Insufficient documentation

## 2021-03-08 NOTE — Progress Notes (Signed)
[place]  [phone] [phone]   Name: Malik Lynch Date: 03/08/2021 MRN: [id number]  DOB: 10-12-1970  Patient Care Team: Leonard Downing, MD as PCP - General (Family Medicine) [name], RN as Oncology Nurse Navigator    REASON FOR CONSULTATION: Malik Lynch is a 50 y.o. male with multiple medical problems including stage IIIb rectal cancer s/p XRT and neoadjuvant chemotherapy with capecitabine and resection.  Patient has had pain, weight loss, and anxiety.  He was referred to palliative care to help address goals and manage ongoing symptoms.  SOCIAL HISTORY:     reports that he has been smoking cigarettes. He has been smoking an average of 1 pack per day. He has never used smokeless tobacco. He reports current alcohol use of about 1.0 standard drink per week. He reports that he does not use drugs.  Patient is married and lives at home with his wife.  He has 2 sons who live nearby.  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    ADVANCE DIRECTIVES:  Not on file  CODE STATUS:   PAST MEDICAL HISTORY: Past Medical History:  Diagnosis Date   Family history of breast cancer    Family history of colon cancer    Family history of kidney cancer    Family history of prostate cancer    Family history of uterine cancer     PAST SURGICAL HISTORY:  Past Surgical History:  Procedure Laterality Date   IR IMAGING GUIDED PORT INSERTION  09/29/2020    HEMATOLOGY/ONCOLOGY HISTORY:  Oncology History  Rectal cancer ([place])  06/22/2020 Initial Diagnosis   Rectal cancer ([place])   06/22/2020 Cancer Staging   Staging form: Colon and Rectum, AJCC 8th Edition - Clinical stage from 06/22/2020: Stage IIIB (cT3, cN1a, cM0) - Signed by Lloyd Huger, MD on 06/22/2020   06/22/2020 - 06/22/2020 Chemotherapy          Genetic Testing   Negative genetic testing. No  pathogenic variants identified on the [facility] CancerNext-Expanded+RNA panel. The report date is 08/18/2020.   The CancerNext-Expanded + RNAinsight gene panel offered by [employer] and includes sequencing and rearrangement analysis for the following 77 genes: IP, ALK, APC*, ATM*, AXIN2, BAP1, BARD1, BLM, BMPR1A, BRCA1*, BRCA2*, BRIP1*, CDC73, CDH1*,CDK4, CDKN1B, CDKN2A, CHEK2*, CTNNA1, DICER1, FANCC, FH, FLCN, GALNT12, KIF1B, LZTR1, MAX, MEN1, MET, MLH1*, MSH2*, MSH3, MSH6*, MUTYH*, NBN, NF1*, NF2, NTHL1, PALB2*, PHOX2B, PMS2*, POT1, PRKAR1A, PTCH1, PTEN*, RAD51C*, RAD51D*,RB1, RECQL, RET, SDHA, SDHAF2, SDHB, SDHC, SDHD, SMAD4, SMARCA4, SMARCB1, SMARCE1, STK11, SUFU, TMEM127, TP53*,TSC1, TSC2, VHL and XRCC2 (sequencing and deletion/duplication); EGFR, EGLN1, HOXB13, KIT, MITF, PDGFRA, POLD1 and POLE (sequencing only); EPCAM and GREM1 (deletion/duplication only).    10/03/2020 -  Chemotherapy    Patient is on Treatment Plan: COLORECTAL FOLFOX Q14D X 4 MONTHS        ALLERGIES:  has No Known Allergies.  MEDICATIONS:  Current Outpatient Medications  Medication Sig Dispense Refill   ALPRAZolam (XANAX) 0.5 MG tablet Take 1 tablet (0.5 mg total) by mouth 2 (two) times daily as needed for anxiety. 60 tablet 0   cyclobenzaprine (FLEXERIL) 10 MG tablet Take 1 tablet (10 mg total) by mouth 3 (three) times daily as needed for muscle spasms. 60 tablet 0   ibuprofen (ADVIL,MOTRIN) 100 MG tablet Take 800 mg by mouth every 6 (six) hours as needed.     ketoconazole (NIZORAL) 2 % shampoo Apply 1  application topically as directed. 3 times per week wash from scalp down for 6 weeks then once monthly for prevention. Leave on for a few minutes then rinse. 120 mL 6   lactulose (CHRONULAC) 10 GM/15ML solution Take 15-30 mLs (10-20 g total) by mouth 2 (two) times daily as needed for severe constipation. 236 mL 0   lidocaine-prilocaine (EMLA) cream Apply to affected area once 30 g 3   loperamide (IMODIUM A-D) 2 MG tablet  Take 2 mg by mouth 4 (four) times daily as needed for diarrhea or loose stools.     ondansetron (ZOFRAN) 8 MG tablet Take 1 tablet (8 mg total) by mouth every 8 (eight) hours as needed for nausea or vomiting. 45 tablet 0   oxyCODONE (OXYCONTIN) 15 mg 12 hr tablet Take 1 tablet (15 mg total) by mouth every 8 (eight) hours. 90 tablet 0   oxyCODONE-acetaminophen (PERCOCET/ROXICET) 5-325 MG tablet TAKE 1-2 TABLETS BY MOUTH EVERY 4 HOURS AS NEEDED FOR SEVERE PAIN. 90 tablet 0   polyethylene glycol (MIRALAX / GLYCOLAX) 17 g packet Take 17 g by mouth daily.     prochlorperazine (COMPAZINE) 10 MG tablet TAKE ONE TABLET BY MOUTH EVERY 6 HOURS AS NEEDED FOR NAUSEA / VOMITING 60 tablet 1   senna (SENOKOT) 8.6 MG TABS tablet Take 1 tablet (8.6 mg total) by mouth daily. 120 tablet 3   Sennosides-Docusate Sodium 8.6-50 MG CAPS Take 1 tablet by mouth as needed for constipation.     tamsulosin (FLOMAX) 0.4 MG CAPS capsule Take 1 capsule (0.4 mg total) by mouth daily. 90 capsule 2   No current facility-administered medications for this visit.    VITAL SIGNS: There were no vitals taken for this visit. There were no vitals filed for this visit.  Estimated body mass index is 21.39 kg/m as calculated from the following:   Height as of 11/28/20: _0  (1.88 m).   Weight as of an earlier encounter on 03/08/21: 166 lb 9.6 oz (75.6 kg).  LABS: CBC:    Component Value Date/Time   WBC 4.7 12/12/2020 0854   HGB 11.9 (L) 12/12/2020 0854   HCT 34.4 (L) 12/12/2020 0854   PLT 91 (L) 12/12/2020 0854   MCV 97.2 12/12/2020 0854   NEUTROABS 3.4 12/12/2020 0854   LYMPHSABS 0.6 (L) 12/12/2020 0854   MONOABS 0.6 12/12/2020 0854   EOSABS 0.1 12/12/2020 0854   BASOSABS 0.0 12/12/2020 0854   Comprehensive Metabolic Panel:    Component Value Date/Time   NA 134 (L) 12/12/2020 0854   K 3.9 12/12/2020 0854   CL 100 12/12/2020 0854   CO2 27 12/12/2020 0854   BUN 9 12/12/2020 0854   CREATININE 0.82 12/12/2020 0854    GLUCOSE 103 (H) 12/12/2020 0854   CALCIUM 9.0 12/12/2020 0854   AST 21 12/12/2020 0854   ALT 9 12/12/2020 0854   ALKPHOS 84 12/12/2020 0854   BILITOT 0.5 12/12/2020 0854   PROT 6.9 12/12/2020 0854   ALBUMIN 3.4 (L) 12/12/2020 0854    RADIOGRAPHIC STUDIES: No results found.  PERFORMANCE STATUS (ECOG) : 1 - Symptomatic but completely ambulatory  Review of Systems Unless otherwise noted, a complete review of systems is negative.  Physical Exam General: NAD Pulmonary: Unlabored Extremities: no edema, no joint deformities Skin: Erythematous rash to torso  Neurological: Grossly nonfocal  IMPRESSION: Routine follow-up visit.    Patient is status post resection at [facility] on 02/05/2021.  He reports that he is recovering well and denies any significant changes or  symptomatic concerns.  He continues to endorse chronic pain but is relatively stable on current regimen of OxyContin/Percocet.  Patient does endorse some financial distress.  We will refer him to social work  PLAN: -Continue current scope of treatment -Continue alprazolam 0.5 mg twice daily as needed for anxiety -Continue OxyContin 15 mg every 8 hours and Percocet 1 to 2 tablets every 4 hours as needed for breakthrough pain -Referral to SW -RTC 2 to 3 months   Case and plan discussed with Dr. Grayland Ormond   Patient expressed understanding and was in agreement with this plan. He also understands that He can call the clinic at any time with any questions, concerns, or complaints.     Time Total: 15 minutes  Visit consisted of counseling and education dealing with the complex and emotionally intense issues of symptom management and palliative care in the setting of serious and potentially life-threatening illness.Greater than 50%  of this time was spent counseling and coordinating care related to the above assessment and plan.  Signed by: Altha Harm, PhD, NP-C

## 2021-03-08 NOTE — Progress Notes (Signed)
Patient here for follow up he has a few complications from surgery but is progressing.

## 2021-03-10 ENCOUNTER — Encounter: Payer: Self-pay | Admitting: Oncology

## 2021-03-12 ENCOUNTER — Encounter: Payer: Self-pay | Admitting: *Deleted

## 2021-03-12 NOTE — Progress Notes (Signed)
[facility] Work  Clinical Social Work was referred by Billey Chang, NP, for assessment of psychosocial needs-specifically financial needs.  Clinical Social Worker contacted patient by phone  to offer support and assess for needs.  CSW also spoke with patient's spouse.    Malik Lynch summarized recent treatment experience including chemotherapy and surgery.  He shared he's in the "healing process" and is still unable to work. CSW spoke with patient's spouse whom had concerns with covering cost of medications- patient will continue to receive assistance with medications through the [facility]. CSW discussed with K.Smith.  Gwinda Maine, LCSW  Clinical Social Worker [facility]

## 2021-03-13 ENCOUNTER — Ambulatory Visit: Payer: Self-pay | Admitting: Dermatology

## 2021-03-17 ENCOUNTER — Other Ambulatory Visit: Payer: Self-pay | Admitting: Oncology

## 2021-03-19 ENCOUNTER — Other Ambulatory Visit: Payer: Self-pay | Admitting: *Deleted

## 2021-03-19 MED ORDER — ALPRAZOLAM 0.5 MG PO TABS: 0.5000 mg | ORAL_TABLET | Freq: Two times a day (BID) | ORAL | 0 refills | Status: DC | PRN

## 2021-03-21 ENCOUNTER — Inpatient Hospital Stay: Payer: Self-pay

## 2021-03-21 DIAGNOSIS — Z95828 Presence of other vascular implants and grafts: Secondary | ICD-10-CM

## 2021-03-21 MED ORDER — SODIUM CHLORIDE 0.9% FLUSH
10.0000 mL | Freq: Once | INTRAVENOUS | Status: AC
  Administered 2021-03-21: 10 mL via INTRAVENOUS
  Filled 2021-03-21: qty 10

## 2021-03-21 MED ORDER — HEPARIN SOD (PORK) LOCK FLUSH 100 UNIT/ML IV SOLN
INTRAVENOUS | Status: AC
  Filled 2021-03-21: qty 5

## 2021-03-21 MED ORDER — HEPARIN SOD (PORK) LOCK FLUSH 100 UNIT/ML IV SOLN
500.0000 [IU] | Freq: Once | INTRAVENOUS | Status: AC
  Administered 2021-03-21: 500 [IU] via INTRAVENOUS
  Filled 2021-03-21: qty 5

## 2021-03-29 ENCOUNTER — Other Ambulatory Visit: Payer: Self-pay | Admitting: *Deleted

## 2021-03-29 MED ORDER — OXYCODONE HCL ER 15 MG PO T12A: 15.0000 mg | EXTENDED_RELEASE_TABLET | Freq: Three times a day (TID) | ORAL | 0 refills | Status: DC

## 2021-03-30 ENCOUNTER — Encounter: Payer: Self-pay | Admitting: Oncology

## 2021-04-08 ENCOUNTER — Encounter: Payer: Self-pay | Admitting: Hospice and Palliative Medicine

## 2021-04-10 ENCOUNTER — Telehealth: Payer: Self-pay | Admitting: Hospice and Palliative Medicine

## 2021-04-10 NOTE — Telephone Encounter (Signed)
I spoke with patient/wife by phone. Patient reports he is improving. Pain is stable. He continues to take OxyContin 15mg  TID but rarely needs any Percocet for BTP. We discussed plan to start weaning patient from the OxyContin and he verbalized agreement with this. Will plan to reduce dose of OxyContin to 15mg  BID at time of next refill (end of the month) and then subsequently reduce to 10mg  BID if tolerated.   Lauren - would you please reach out to him. He has some questions about med assistance. I know we discussed the cost of the OxyContin but we have historically tried several other [place] and patient did not tolerate. There should be an immediate improvement in the cost due to reduced quantity of the prescription.

## 2021-04-12 ENCOUNTER — Encounter: Payer: Self-pay | Admitting: *Deleted

## 2021-04-12 NOTE — Progress Notes (Signed)
[facility] Work  Clinical Social Work was referred by palliative care NP for questions regarding medication assistance.  Clinical Social Worker contacted patient by phone  to offer support and assess for needs.  CSW explained to patient that cancer center grant ([place]) would continue to cover cost of medications as prescribed by medical oncology.  CSW notified K.Smith of plan.   Gwinda Maine, LCSW  Clinical Social Worker [facility]

## 2021-04-13 ENCOUNTER — Other Ambulatory Visit: Payer: Self-pay | Admitting: Oncology

## 2021-04-13 ENCOUNTER — Other Ambulatory Visit: Payer: Self-pay | Admitting: *Deleted

## 2021-04-13 ENCOUNTER — Other Ambulatory Visit: Payer: Self-pay

## 2021-04-13 MED ORDER — ALPRAZOLAM 0.5 MG PO TABS: 0.5000 mg | ORAL_TABLET | Freq: Two times a day (BID) | ORAL | 0 refills | Status: DC | PRN

## 2021-04-13 MED ORDER — CYCLOBENZAPRINE HCL 10 MG PO TABS: ORAL_TABLET | ORAL | 0 refills | Status: DC

## 2021-04-25 ENCOUNTER — Other Ambulatory Visit: Payer: Self-pay | Admitting: Oncology

## 2021-04-25 ENCOUNTER — Other Ambulatory Visit: Payer: Self-pay | Admitting: *Deleted

## 2021-04-25 DIAGNOSIS — C20 Malignant neoplasm of rectum: Secondary | ICD-10-CM

## 2021-04-25 MED ORDER — PROCHLORPERAZINE MALEATE 10 MG PO TABS: ORAL_TABLET | ORAL | 1 refills | Status: AC

## 2021-05-02 ENCOUNTER — Inpatient Hospital Stay: Payer: Self-pay | Attending: Oncology

## 2021-05-02 ENCOUNTER — Ambulatory Visit
Admission: RE | Admit: 2021-05-02 | Discharge: 2021-05-02 | Disposition: A | Discharge status: Home / Self Care | Payer: Self-pay | Source: Ambulatory Visit | Attending: Oncology | Admitting: Oncology

## 2021-05-02 ENCOUNTER — Other Ambulatory Visit: Payer: Self-pay

## 2021-05-02 DIAGNOSIS — C20 Malignant neoplasm of rectum: Secondary | ICD-10-CM | POA: Insufficient documentation

## 2021-05-02 DIAGNOSIS — F419 Anxiety disorder, unspecified: Secondary | ICD-10-CM | POA: Insufficient documentation

## 2021-05-02 DIAGNOSIS — R5383 Other fatigue: Secondary | ICD-10-CM | POA: Insufficient documentation

## 2021-05-02 DIAGNOSIS — Z803 Family history of malignant neoplasm of breast: Secondary | ICD-10-CM | POA: Insufficient documentation

## 2021-05-02 DIAGNOSIS — Z800 Family history of malignant neoplasm of digestive organs: Secondary | ICD-10-CM | POA: Insufficient documentation

## 2021-05-02 DIAGNOSIS — R531 Weakness: Secondary | ICD-10-CM | POA: Insufficient documentation

## 2021-05-02 DIAGNOSIS — Z8042 Family history of malignant neoplasm of prostate: Secondary | ICD-10-CM | POA: Insufficient documentation

## 2021-05-02 DIAGNOSIS — F17210 Nicotine dependence, cigarettes, uncomplicated: Secondary | ICD-10-CM | POA: Insufficient documentation

## 2021-05-02 DIAGNOSIS — Z85528 Personal history of other malignant neoplasm of kidney: Secondary | ICD-10-CM | POA: Insufficient documentation

## 2021-05-02 DIAGNOSIS — R634 Abnormal weight loss: Secondary | ICD-10-CM | POA: Insufficient documentation

## 2021-05-02 DIAGNOSIS — Z95828 Presence of other vascular implants and grafts: Secondary | ICD-10-CM

## 2021-05-02 LAB — CBC WITH DIFFERENTIAL/PLATELET
Abs Immature Granulocytes: 0.03 10*3/uL (ref 0.00–0.07)
Basophils Absolute: 0.1 10*3/uL (ref 0.0–0.1)
Basophils Relative: 1 %
Eosinophils Absolute: 0.3 10*3/uL (ref 0.0–0.5)
Eosinophils Relative: 3 %
HCT: 37 % — ABNORMAL LOW (ref 39.0–52.0)
Hemoglobin: 12.3 g/dL — ABNORMAL LOW (ref 13.0–17.0)
Immature Granulocytes: 0 %
Lymphocytes Relative: 16 %
Lymphs Abs: 1.4 10*3/uL (ref 0.7–4.0)
MCH: 32.3 pg (ref 26.0–34.0)
MCHC: 33.2 g/dL (ref 30.0–36.0)
MCV: 97.1 fL (ref 80.0–100.0)
Monocytes Absolute: 0.5 10*3/uL (ref 0.1–1.0)
Monocytes Relative: 6 %
Neutro Abs: 6.4 10*3/uL (ref 1.7–7.7)
Neutrophils Relative %: 74 %
Platelets: 259 10*3/uL (ref 150–400)
RBC: 3.81 MIL/uL — ABNORMAL LOW (ref 4.22–5.81)
RDW: 13.6 % (ref 11.5–15.5)
WBC: 8.7 10*3/uL (ref 4.0–10.5)
nRBC: 0 % (ref 0.0–0.2)

## 2021-05-02 LAB — COMPREHENSIVE METABOLIC PANEL
ALT: 12 U/L (ref 0–44)
AST: 16 U/L (ref 15–41)
Albumin: 4.3 g/dL (ref 3.5–5.0)
Alkaline Phosphatase: 91 U/L (ref 38–126)
Anion gap: 6 (ref 5–15)
BUN: 9 mg/dL (ref 6–20)
CO2: 27 mmol/L (ref 22–32)
Calcium: 9.2 mg/dL (ref 8.9–10.3)
Chloride: 102 mmol/L (ref 98–111)
Creatinine, Ser: 0.79 mg/dL (ref 0.61–1.24)
GFR, Estimated: >60 mL/min (ref >60)
Glucose, Bld: 93 mg/dL (ref 70–99)
Potassium: 4.2 mmol/L (ref 3.5–5.1)
Sodium: 135 mmol/L (ref 135–145)
Total Bilirubin: 0.3 mg/dL (ref 0.3–1.2)
Total Protein: 7.6 g/dL (ref 6.5–8.1)

## 2021-05-02 MED ORDER — IOHEXOL 350 MG/ML SOLN
80.0000 mL | Freq: Once | INTRAVENOUS | Status: AC | PRN
  Administered 2021-05-02: 80 mL via INTRAVENOUS

## 2021-05-02 MED ORDER — SODIUM CHLORIDE 0.9% FLUSH
10.0000 mL | Freq: Once | INTRAVENOUS | Status: AC
  Administered 2021-05-02: 10 mL via INTRAVENOUS
  Filled 2021-05-02: qty 10

## 2021-05-02 MED ORDER — HEPARIN SOD (PORK) LOCK FLUSH 100 UNIT/ML IV SOLN
500.0000 [IU] | Freq: Once | INTRAVENOUS | Status: AC
  Administered 2021-05-02: 500 [IU] via INTRAVENOUS
  Filled 2021-05-02: qty 5

## 2021-05-03 LAB — CEA: CEA: 3.5 ng/mL (ref 0.0–4.7)

## 2021-05-03 NOTE — Progress Notes (Signed)
[place]  Telephone:(336) [phone] Fax:([phone]  ID: Malik Lynch OB: Aug 05, 1970  MR#: [id number]  SAY#:[id number]  Patient Care Team: Leonard Downing, MD as PCP - General (Family Medicine) [name], RN as Oncology Nurse Navigator  CHIEF COMPLAINT: Pathologic stage IIa rectal cancer, stage I clear cell carcinoma kidney.  INTERVAL HISTORY: Patient returns to clinic today for further evaluation and discussion of his imaging results.  He continues to have weakness and fatigue, but states his strength is improving on a weekly basis.  He is nearly fully healed from his surgery.  His pain is improving and is beginning to wean off narcotics.  He has no neurologic complaints. He denies any recent fevers or illnesses. He has no chest pain, shortness of breath, cough, or hemoptysis.  He denies any nausea, vomiting, constipation, or diarrhea.  He has no urinary complaints.  Patient offers no further specific complaints today.  REVIEW OF SYSTEMS:   Review of Systems  Constitutional:  Positive for malaise/fatigue. Negative for fever and weight loss.  Respiratory: Negative.  Negative for cough, hemoptysis and shortness of breath.   Cardiovascular: Negative.  Negative for chest pain and leg swelling.  Gastrointestinal:  Negative for abdominal pain, blood in stool, diarrhea and melena.  Genitourinary: Negative.  Negative for urgency.  Musculoskeletal: Negative.  Negative for back pain.  Skin: Negative.  Negative for rash.  Neurological:  Positive for weakness. Negative for dizziness, focal weakness and headaches.  Psychiatric/Behavioral:  The patient is not nervous/anxious and does not have insomnia.    As per HPI. Otherwise, a complete review of systems is negative.  PAST MEDICAL HISTORY: Past Medical History:  Diagnosis Date   Family history of breast cancer    Family history of colon cancer    Family history of kidney cancer    Family history of  prostate cancer    Family history of uterine cancer     PAST SURGICAL HISTORY: Past Surgical History:  Procedure Laterality Date   IR IMAGING GUIDED PORT INSERTION  09/29/2020    FAMILY HISTORY: Family History  Problem Relation Age of Onset   Throat cancer Mother 83   Lymphoma Maternal Aunt    Kidney cancer Maternal Uncle    Breast cancer Paternal Aunt    Colon cancer Paternal Uncle    Lung cancer Maternal Grandmother    Cancer Maternal Grandmother        mouth   Prostate cancer Maternal Grandfather        dx 53s   Colon cancer Maternal Grandfather        dx 80s   Lung cancer Maternal Uncle    Cancer Maternal Uncle        possibly kidney   Uterine cancer Paternal Aunt    Cancer Paternal Aunt        reproductive   Kidney cancer Paternal Uncle    Breast cancer Cousin    Breast cancer Cousin    Colon cancer Cousin        dx 82s    ADVANCED DIRECTIVES (Y/N):  N  HEALTH MAINTENANCE: Social History   Tobacco Use   Smoking status: Some Days    Packs/day: 1.00    Types: Cigarettes   Smokeless tobacco: Never  Vaping Use   Vaping Use: Never used  Substance Use Topics   Alcohol use: Yes    Alcohol/week: 1.0 standard drink    Types: 1 Cans of beer per week   Drug use:  No     Colonoscopy:  PAP:  Bone density:  Lipid panel:  No Known Allergies  Current Outpatient Medications  Medication Sig Dispense Refill   ALPRAZolam (XANAX) 0.5 MG tablet Take 1 tablet (0.5 mg total) by mouth 2 (two) times daily as needed for anxiety. 60 tablet 0   cyclobenzaprine (FLEXERIL) 10 MG tablet TAKE ONE TABLET (10 MG) BY MOUTH 3 TIMESDAILY AS NEEDED FOR MUSCLE SPASMS 60 tablet 0   ibuprofen (ADVIL,MOTRIN) 100 MG tablet Take 800 mg by mouth every 6 (six) hours as needed.     ketoconazole (NIZORAL) 2 % shampoo Apply 1 application topically as directed. 3 times per week wash from scalp down for 6 weeks then once monthly for prevention. Leave on for a few minutes then rinse. 120 mL 6    lactulose (CHRONULAC) 10 GM/15ML solution Take 15-30 mLs (10-20 g total) by mouth 2 (two) times daily as needed for severe constipation. 236 mL 0   lidocaine-prilocaine (EMLA) cream Apply to affected area once 30 g 3   loperamide (IMODIUM A-D) 2 MG tablet Take 2 mg by mouth 4 (four) times daily as needed for diarrhea or loose stools.     ondansetron (ZOFRAN) 8 MG tablet Take 1 tablet (8 mg total) by mouth every 8 (eight) hours as needed for nausea or vomiting. 45 tablet 0   oxyCODONE-acetaminophen (PERCOCET/ROXICET) 5-325 MG tablet TAKE 1-2 TABLETS BY MOUTH EVERY 4 HOURS AS NEEDED FOR SEVERE PAIN. 90 tablet 0   OXYCONTIN 15 MG 12 hr tablet TAKE ONE TABLET BY MOUTH EVERY EIGHT HOURS AS DIRECTED 90 tablet 0   polyethylene glycol (MIRALAX / GLYCOLAX) 17 g packet Take 17 g by mouth daily.     prochlorperazine (COMPAZINE) 10 MG tablet TAKE ONE TABLET BY MOUTH EVERY 6 HOURS AS NEEDED FOR NAUSEA / VOMITING 60 tablet 1   senna (SENOKOT) 8.6 MG TABS tablet Take 1 tablet (8.6 mg total) by mouth daily. 120 tablet 3   Sennosides-Docusate Sodium 8.6-50 MG CAPS Take 1 tablet by mouth as needed for constipation.     tamsulosin (FLOMAX) 0.4 MG CAPS capsule Take 1 capsule (0.4 mg total) by mouth daily. (Patient not taking: Reported on 05/08/2021) 90 capsule 2   No current facility-administered medications for this visit.    OBJECTIVE: Vitals:   05/08/21 1042  BP: 133/90  Pulse: 75  Resp: 20  Temp: 97.8 F (36.6 C)  SpO2: 100%     Body mass index is 23.68 kg/m.    ECOG FS:1 - Symptomatic but completely ambulatory  General: Well-developed, well-nourished, no acute distress. Eyes: Pink conjunctiva, anicteric sclera. HEENT: Normocephalic, moist mucous membranes. Lungs: No audible wheezing or coughing. Heart: Regular rate and rhythm. Abdomen: Soft, nontender, no obvious distention.  Colostomy noted. Musculoskeletal: No edema, cyanosis, or clubbing. Neuro: Alert, answering all questions appropriately.  Cranial nerves grossly intact. Skin: No rashes or petechiae noted. Psych: Normal affect.  LAB RESULTS:  Lab Results  Component Value Date   NA 135 05/02/2021   K 4.2 05/02/2021   CL 102 05/02/2021   CO2 27 05/02/2021   GLUCOSE 93 05/02/2021   BUN 9 05/02/2021   CREATININE 0.79 05/02/2021   CALCIUM 9.2 05/02/2021   PROT 7.6 05/02/2021   ALBUMIN 4.3 05/02/2021   AST 16 05/02/2021   ALT 12 05/02/2021   ALKPHOS 91 05/02/2021   BILITOT 0.3 05/02/2021   GFRNONAA >60 05/02/2021    Lab Results  Component Value Date   WBC 8.7 05/02/2021  NEUTROABS 6.4 05/02/2021   HGB 12.3 (L) 05/02/2021   HCT 37.0 (L) 05/02/2021   MCV 97.1 05/02/2021   PLT 259 05/02/2021     STUDIES: CT CHEST ABDOMEN PELVIS W CONTRAST  Result Date: 05/02/2021 CLINICAL DATA:  Colorectal cancer, status post chemotherapy. EXAM: CT CHEST, ABDOMEN, AND PELVIS WITH CONTRAST TECHNIQUE: Multidetector CT imaging of the chest, abdomen and pelvis was performed following the standard protocol during bolus administration of intravenous contrast. CONTRAST:  76mL OMNIPAQUE IOHEXOL 350 MG/ML SOLN COMPARISON:  06/28/2020 PET. Prior chest CT scratch the prior chest CT 06/06/2020. Abdominopelvic CT 05/11/2020. FINDINGS: CT CHEST FINDINGS Cardiovascular: Right Port-A-Cath tip high right atrium. Aortic atherosclerosis. Normal heart size, without pericardial effusion. Lad coronary artery calcification. No central pulmonary embolism, on this non-dedicated study. Mediastinum/Nodes: No mediastinal or hilar adenopathy. Lungs/Pleura: No pleural fluid. Mild centrilobular and paraseptal emphysema. Musculoskeletal: No acute osseous abnormality. CT ABDOMEN PELVIS FINDINGS Hepatobiliary: Normal liver. Normal gallbladder, without biliary ductal dilatation. Pancreas: Normal, without mass or ductal dilatation. Spleen: Normal in size, without focal abnormality. Adrenals/Urinary Tract: Normal adrenal glands. Interval lower pole left renal partial  nephrectomy. Normal right kidney. No hydronephrosis. Normal urinary bladder. Stomach/Bowel: Normal stomach, without wall thickening. Status post abdominal perineal resection with descending and colostomy. Normal terminal ileum. Normal appendix. Normal small bowel. Vascular/Lymphatic: Aortic atherosclerosis. Circumaortic left renal vein. No abdominopelvic adenopathy. Reproductive: Normal prostate. Other: No significant free fluid. No evidence of omental or peritoneal disease. Musculoskeletal: Degenerative disc disease at the lumbosacral junction. No worrisome osseous lesion. IMPRESSION: 1. Status post lower pole left partial nephrectomy and abdominal perineal resection with end colostomy. No findings of recurrent or metastatic disease from either primary. 2. Age advanced coronary artery atherosclerosis. Recommend assessment of coronary risk factors and consideration of medical therapy. 3. Aortic atherosclerosis (ICD10-I70.0) and emphysema (ICD10-J43.9). Electronically Signed   By: Abigail Miyamoto M.D.   On: 05/02/2021 16:17    ASSESSMENT: Pathologic stage IIa rectal cancer, stage I clear cell carcinoma kidney.   PLAN:    1.  Pathologic stage IIa rectal cancer:  Patient completed his neoadjuvant treatment with chemotherapy and XRT on August 17, 2020.  He subsequently underwent treatment with chemotherapy only using FOLFOX and completed 6 cycles on December 12, 2020.  He underwent surgical resection on February 05, 2021.  CT scan results from May 02, 2021 reviewed independently and report as above with no obvious evidence of recurrent or progressive disease.  Patient CEA is within normal limits at 3.5.  No intervention is needed at this time.  Return to clinic in 3 months with repeat laboratory work, imaging, and further evaluation.  If imaging remains stable, patient likely can be transitioned to imaging and evaluation every 6 months. 2.  Weight loss: Improving. 3.  Pain: Improving.  Appreciate palliative care  input.  Patient is now tapering his narcotics. 4.  Stage I clear cell carcinoma: Patient is status postresection on February 05, 2021.  He does not require adjuvant treatment. 5.  Urinary retention: Resolved.   6.  Anxiety: Continue Xanax as needed.   Patient expressed understanding and was in agreement with this plan. He also understands that He can call clinic at any time with any questions, concerns, or complaints.   Cancer Staging Rectal cancer [facility]) Staging form: Colon and Rectum, AJCC 8th Edition - Clinical stage from 06/22/2020: Stage IIIB (cT3, cN1a, cM0) - Signed by Lloyd Huger, MD on 06/22/2020 Stage prefix: Initial diagnosis Total positive nodes: [place],  MD   05/08/2021 5:51 PM

## 2021-05-08 ENCOUNTER — Inpatient Hospital Stay (HOSPITAL_BASED_OUTPATIENT_CLINIC_OR_DEPARTMENT_OTHER): Payer: Self-pay | Admitting: Oncology

## 2021-05-08 ENCOUNTER — Inpatient Hospital Stay (HOSPITAL_BASED_OUTPATIENT_CLINIC_OR_DEPARTMENT_OTHER): Payer: Self-pay | Admitting: Hospice and Palliative Medicine

## 2021-05-08 ENCOUNTER — Encounter: Payer: Self-pay | Admitting: Oncology

## 2021-05-08 ENCOUNTER — Other Ambulatory Visit: Payer: Self-pay

## 2021-05-08 VITALS — BP 133/90 | HR 75 | Temp 97.8°F | Resp 20 | Wt 184.4 lb

## 2021-05-08 DIAGNOSIS — C20 Malignant neoplasm of rectum: Secondary | ICD-10-CM

## 2021-05-08 DIAGNOSIS — G893 Neoplasm related pain (acute) (chronic): Secondary | ICD-10-CM

## 2021-05-08 DIAGNOSIS — Z515 Encounter for palliative care: Secondary | ICD-10-CM

## 2021-05-08 NOTE — Progress Notes (Signed)
[facility]  [phone] [phone]) [phone]   Name: Malik Lynch Date: 05/08/2021 MRN: [id number]  DOB: [birth year]  Patient Care Team: Leonard Downing, MD as PCP - General (Family Medicine) [name], RN as Oncology Nurse Navigator    REASON FOR CONSULTATION: Malik Lynch is a 50 y.o. male with multiple medical problems including stage IIIb rectal cancer s/p XRT and neoadjuvant chemotherapy with capecitabine and resection.  Patient has had pain, weight loss, and anxiety.  He was referred to palliative care to help address goals and manage ongoing symptoms.  SOCIAL HISTORY:     reports that he has been smoking cigarettes. He has been smoking an average of 1 pack per day. He has never used smokeless tobacco. He reports current alcohol use of about 1.0 standard drink per week. He reports that he does not use drugs.  Patient is married and lives at home with his wife.  He has 2 sons who live nearby.  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    ADVANCE DIRECTIVES:  Not on file  CODE STATUS:   PAST MEDICAL HISTORY: Past Medical History:  Diagnosis Date   Family history of breast cancer    Family history of colon cancer    Family history of kidney cancer    Family history of prostate cancer    Family history of uterine cancer     PAST SURGICAL HISTORY:  Past Surgical History:  Procedure Laterality Date   IR IMAGING GUIDED PORT INSERTION  09/29/2020    HEMATOLOGY/ONCOLOGY HISTORY:  Oncology History  Rectal cancer ([place])  06/22/2020 Initial Diagnosis   Rectal cancer ([place])   06/22/2020 Cancer Staging   Staging form: Colon and Rectum, AJCC 8th Edition - Clinical stage from 06/22/2020: Stage IIIB (cT3, cN1a, cM0) - Signed by Lloyd Huger, MD on 06/22/2020   06/22/2020 - 06/22/2020 Chemotherapy          Genetic Testing   Negative genetic testing. No  pathogenic variants identified on the [facility] CancerNext-Expanded+RNA panel. The report date is 08/18/2020.   The CancerNext-Expanded + RNAinsight gene panel offered by [employer] and includes sequencing and rearrangement analysis for the following 77 genes: IP, ALK, APC*, ATM*, AXIN2, BAP1, BARD1, BLM, BMPR1A, BRCA1*, BRCA2*, BRIP1*, CDC73, CDH1*,CDK4, CDKN1B, CDKN2A, CHEK2*, CTNNA1, DICER1, FANCC, FH, FLCN, GALNT12, KIF1B, LZTR1, MAX, MEN1, MET, MLH1*, MSH2*, MSH3, MSH6*, MUTYH*, NBN, NF1*, NF2, NTHL1, PALB2*, PHOX2B, PMS2*, POT1, PRKAR1A, PTCH1, PTEN*, RAD51C*, RAD51D*,RB1, RECQL, RET, SDHA, SDHAF2, SDHB, SDHC, SDHD, SMAD4, SMARCA4, SMARCB1, SMARCE1, STK11, SUFU, TMEM127, TP53*,TSC1, TSC2, VHL and XRCC2 (sequencing and deletion/duplication); EGFR, EGLN1, HOXB13, KIT, MITF, PDGFRA, POLD1 and POLE (sequencing only); EPCAM and GREM1 (deletion/duplication only).    10/03/2020 -  Chemotherapy    Patient is on Treatment Plan: COLORECTAL FOLFOX Q14D X 4 MONTHS        ALLERGIES:  has No Known Allergies.  MEDICATIONS:  Current Outpatient Medications  Medication Sig Dispense Refill   ALPRAZolam (XANAX) 0.5 MG tablet Take 1 tablet (0.5 mg total) by mouth 2 (two) times daily as needed for anxiety. 60 tablet 0   cyclobenzaprine (FLEXERIL) 10 MG tablet TAKE ONE TABLET (10 MG) BY MOUTH 3 TIMESDAILY AS NEEDED FOR MUSCLE SPASMS 60 tablet 0   ibuprofen (ADVIL,MOTRIN) 100 MG tablet Take 800 mg by mouth every 6 (six) hours as needed.     ketoconazole (NIZORAL) 2 % shampoo Apply 1 application topically as  directed. 3 times per week wash from scalp down for 6 weeks then once monthly for prevention. Leave on for a few minutes then rinse. 120 mL 6   lactulose (CHRONULAC) 10 GM/15ML solution Take 15-30 mLs (10-20 g total) by mouth 2 (two) times daily as needed for severe constipation. 236 mL 0   lidocaine-prilocaine (EMLA) cream Apply to affected area once 30 g 3   loperamide (IMODIUM A-D) 2 MG tablet Take 2 mg by  mouth 4 (four) times daily as needed for diarrhea or loose stools.     ondansetron (ZOFRAN) 8 MG tablet Take 1 tablet (8 mg total) by mouth every 8 (eight) hours as needed for nausea or vomiting. 45 tablet 0   oxyCODONE-acetaminophen (PERCOCET/ROXICET) 5-325 MG tablet TAKE 1-2 TABLETS BY MOUTH EVERY 4 HOURS AS NEEDED FOR SEVERE PAIN. 90 tablet 0   OXYCONTIN 15 MG 12 hr tablet TAKE ONE TABLET BY MOUTH EVERY EIGHT HOURS AS DIRECTED 90 tablet 0   polyethylene glycol (MIRALAX / GLYCOLAX) 17 g packet Take 17 g by mouth daily.     prochlorperazine (COMPAZINE) 10 MG tablet TAKE ONE TABLET BY MOUTH EVERY 6 HOURS AS NEEDED FOR NAUSEA / VOMITING 60 tablet 1   senna (SENOKOT) 8.6 MG TABS tablet Take 1 tablet (8.6 mg total) by mouth daily. 120 tablet 3   Sennosides-Docusate Sodium 8.6-50 MG CAPS Take 1 tablet by mouth as needed for constipation.     tamsulosin (FLOMAX) 0.4 MG CAPS capsule Take 1 capsule (0.4 mg total) by mouth daily. (Patient not taking: Reported on 05/08/2021) 90 capsule 2   No current facility-administered medications for this visit.    VITAL SIGNS: There were no vitals taken for this visit. There were no vitals filed for this visit.  Estimated body mass index is 23.68 kg/m as calculated from the following:   Height as of 11/28/20: _0  (1.88 m).   Weight as of an earlier encounter on 05/08/21: 184 lb 6.4 oz (83.6 kg).  LABS: CBC:    Component Value Date/Time   WBC 8.7 05/02/2021 0920   HGB 12.3 (L) 05/02/2021 0920   HCT 37.0 (L) 05/02/2021 0920   PLT 259 05/02/2021 0920   MCV 97.1 05/02/2021 0920   NEUTROABS 6.4 05/02/2021 0920   LYMPHSABS 1.4 05/02/2021 0920   MONOABS 0.5 05/02/2021 0920   EOSABS 0.3 05/02/2021 0920   BASOSABS 0.1 05/02/2021 0920   Comprehensive Metabolic Panel:    Component Value Date/Time   NA 135 05/02/2021 0920   K 4.2 05/02/2021 0920   CL 102 05/02/2021 0920   CO2 27 05/02/2021 0920   BUN 9 05/02/2021 0920   CREATININE 0.79 05/02/2021 0920    GLUCOSE 93 05/02/2021 0920   CALCIUM 9.2 05/02/2021 0920   AST 16 05/02/2021 0920   ALT 12 05/02/2021 0920   ALKPHOS 91 05/02/2021 0920   BILITOT 0.3 05/02/2021 0920   PROT 7.6 05/02/2021 0920   ALBUMIN 4.3 05/02/2021 0920    RADIOGRAPHIC STUDIES: CT CHEST ABDOMEN PELVIS W CONTRAST  Result Date: 05/02/2021 CLINICAL DATA:  Colorectal cancer, status post chemotherapy. EXAM: CT CHEST, ABDOMEN, AND PELVIS WITH CONTRAST TECHNIQUE: Multidetector CT imaging of the chest, abdomen and pelvis was performed following the standard protocol during bolus administration of intravenous contrast. CONTRAST:  40m OMNIPAQUE IOHEXOL 350 MG/ML SOLN COMPARISON:  06/28/2020 PET. Prior chest CT scratch the prior chest CT 06/06/2020. Abdominopelvic CT 05/11/2020. FINDINGS: CT CHEST FINDINGS Cardiovascular: Right Port-A-Cath tip high right atrium. Aortic atherosclerosis. Normal  heart size, without pericardial effusion. Lad coronary artery calcification. No central pulmonary embolism, on this non-dedicated study. Mediastinum/Nodes: No mediastinal or hilar adenopathy. Lungs/Pleura: No pleural fluid. Mild centrilobular and paraseptal emphysema. Musculoskeletal: No acute osseous abnormality. CT ABDOMEN PELVIS FINDINGS Hepatobiliary: Normal liver. Normal gallbladder, without biliary ductal dilatation. Pancreas: Normal, without mass or ductal dilatation. Spleen: Normal in size, without focal abnormality. Adrenals/Urinary Tract: Normal adrenal glands. Interval lower pole left renal partial nephrectomy. Normal right kidney. No hydronephrosis. Normal urinary bladder. Stomach/Bowel: Normal stomach, without wall thickening. Status post abdominal perineal resection with descending and colostomy. Normal terminal ileum. Normal appendix. Normal small bowel. Vascular/Lymphatic: Aortic atherosclerosis. Circumaortic left renal vein. No abdominopelvic adenopathy. Reproductive: Normal prostate. Other: No significant free fluid. No evidence of  omental or peritoneal disease. Musculoskeletal: Degenerative disc disease at the lumbosacral junction. No worrisome osseous lesion. IMPRESSION: 1. Status post lower pole left partial nephrectomy and abdominal perineal resection with end colostomy. No findings of recurrent or metastatic disease from either primary. 2. Age advanced coronary artery atherosclerosis. Recommend assessment of coronary risk factors and consideration of medical therapy. 3. Aortic atherosclerosis (ICD10-I70.0) and emphysema (ICD10-J43.9). Electronically Signed   By: Abigail Miyamoto M.D.   On: 05/02/2021 16:17    PERFORMANCE STATUS (ECOG) : 1 - Symptomatic but completely ambulatory  Review of Systems Unless otherwise noted, a complete review of systems is negative.  Physical Exam General: NAD Pulmonary: Unlabored Extremities: no edema, no joint deformities Skin: Erythematous rash to torso  Neurological: Grossly nonfocal  IMPRESSION: Routine follow-up visit.    Patient appears to be doing well.  He denies any significant changes or concerns.  He reports stable pain has been taking OxyContin 15 mg twice daily and tolerating that well.  He occasionally will require a Percocet for breakthrough pain but this is infrequent.  Patient says that he is trying to do more yard work but has some residual fatigue.  He says its been slow improvement.  Appetite is reportedly good.  Patient has gained weight.  He reports good surgical healing.  We discussed plan for continued weaning off the OxyContin.  Neck step would be to dose reduce OxyContin 10 mg twice daily at time of next refill.  Patient does endorse some financial distress.  He has been followed by social work.  PLAN: -Continue current scope of treatment -Continue alprazolam 0.5 mg twice daily as needed for anxiety -Continue OxyContin 15 mg every 12 hours and Percocet 1 to 2 tablets every 4 hours as needed for breakthrough pain -Plan to dose reduce OxyContin to 10 mg twice  daily at time of next refill -RTC 2 to 3 months   Case and plan discussed with Dr. Grayland Ormond   Patient expressed understanding and was in agreement with this plan. He also understands that He can call the clinic at any time with any questions, concerns, or complaints.     Time Total: 15 minutes  Visit consisted of counseling and education dealing with the complex and emotionally intense issues of symptom management and palliative care in the setting of serious and potentially life-threatening illness.Greater than 50%  of this time was spent counseling and coordinating care related to the above assessment and plan.  Signed by: Altha Harm, PhD, NP-C

## 2021-05-22 ENCOUNTER — Other Ambulatory Visit: Payer: Self-pay | Admitting: Oncology

## 2021-05-23 ENCOUNTER — Encounter: Payer: Self-pay | Admitting: Oncology

## 2021-05-23 MED ORDER — ALPRAZOLAM 0.5 MG PO TABS: 0.5000 mg | ORAL_TABLET | Freq: Two times a day (BID) | ORAL | 0 refills | Status: DC | PRN

## 2021-05-23 MED ORDER — CYCLOBENZAPRINE HCL 10 MG PO TABS: ORAL_TABLET | ORAL | 0 refills | Status: DC

## 2021-06-15 ENCOUNTER — Other Ambulatory Visit: Payer: Self-pay | Admitting: *Deleted

## 2021-06-15 MED ORDER — ALPRAZOLAM 0.5 MG PO TABS
0.5000 mg | ORAL_TABLET | Freq: Two times a day (BID) | ORAL | 0 refills | Status: DC | PRN
Start: 2021-06-15 — End: 2021-07-16

## 2021-06-15 MED ORDER — CYCLOBENZAPRINE HCL 10 MG PO TABS: ORAL_TABLET | ORAL | 0 refills | Status: DC

## 2021-06-15 NOTE — Telephone Encounter (Signed)
Patient needs his medications refilled. I went over them with Amy and told her that he should have enough until 12/23. Shew had said we never responded to request last month and he has been hurting. I told her that we sent refill and got confirmation on 05/23/21, She said that his last refill per his bottle was 10/27; she will call pharmacy. She said that he has returned to work part time and he has been taking extra Alprazolam due to "social anxiety" so he is out of his Alprazolam and needs a refill now.Do you want to refill this with a change in directions or leave it as twice a day?

## 2021-07-16 ENCOUNTER — Other Ambulatory Visit: Payer: Self-pay | Admitting: *Deleted

## 2021-07-16 MED ORDER — CYCLOBENZAPRINE HCL 10 MG PO TABS: ORAL_TABLET | ORAL | 0 refills | Status: DC

## 2021-07-16 MED ORDER — ALPRAZOLAM 0.5 MG PO TABS: 0.5000 mg | ORAL_TABLET | Freq: Two times a day (BID) | ORAL | 0 refills | Status: DC | PRN

## 2021-08-02 ENCOUNTER — Other Ambulatory Visit: Payer: Self-pay | Admitting: Oncology

## 2021-08-08 ENCOUNTER — Ambulatory Visit: Admission: RE | Admit: 2021-08-08 | Payer: Self-pay | Source: Ambulatory Visit

## 2021-08-08 ENCOUNTER — Other Ambulatory Visit: Payer: Self-pay | Admitting: *Deleted

## 2021-08-08 ENCOUNTER — Inpatient Hospital Stay: Payer: Self-pay

## 2021-08-08 MED ORDER — OXYCODONE-ACETAMINOPHEN 5-325 MG PO TABS: ORAL_TABLET | ORAL | 0 refills | Status: AC

## 2021-08-13 NOTE — Progress Notes (Unsigned)
[facility]  [phone] [phone]) [phone]  ID: Malik Lynch OB: April 06, 1971  MR#: [id number]  KDT#:[id number]  Patient Care Team: Leonard Downing, MD as PCP - General (Family Medicine) [name], RN as Oncology Nurse Navigator  CHIEF COMPLAINT: Pathologic stage IIa rectal cancer, stage I clear cell carcinoma kidney.  INTERVAL HISTORY: Patient returns to clinic today for further evaluation and discussion of his imaging results.  He continues to have weakness and fatigue, but states his strength is improving on a weekly basis.  He is nearly fully healed from his surgery.  His pain is improving and is beginning to wean off narcotics.  He has no neurologic complaints. He denies any recent fevers or illnesses. He has no chest pain, shortness of breath, cough, or hemoptysis.  He denies any nausea, vomiting, constipation, or diarrhea.  He has no urinary complaints.  Patient offers no further specific complaints today.  REVIEW OF SYSTEMS:   Review of Systems  Constitutional:  Positive for malaise/fatigue. Negative for fever and weight loss.  Respiratory: Negative.  Negative for cough, hemoptysis and shortness of breath.   Cardiovascular: Negative.  Negative for chest pain and leg swelling.  Gastrointestinal:  Negative for abdominal pain, blood in stool, diarrhea and melena.  Genitourinary: Negative.  Negative for urgency.  Musculoskeletal: Negative.  Negative for back pain.  Skin: Negative.  Negative for rash.  Neurological:  Positive for weakness. Negative for dizziness, focal weakness and headaches.  Psychiatric/Behavioral:  The patient is not nervous/anxious and does not have insomnia.    As per HPI. Otherwise, a complete review of systems is negative.  PAST MEDICAL HISTORY: Past Medical History:  Diagnosis Date   Family history of breast cancer    Family history of colon cancer    Family history of kidney cancer    Family history of  prostate cancer    Family history of uterine cancer     PAST SURGICAL HISTORY: Past Surgical History:  Procedure Laterality Date   IR IMAGING GUIDED PORT INSERTION  09/29/2020    FAMILY HISTORY: Family History  Problem Relation Age of Onset   Throat cancer Mother 30   Lymphoma Maternal Aunt    Kidney cancer Maternal Uncle    Breast cancer Paternal Aunt    Colon cancer Paternal Uncle    Lung cancer Maternal Grandmother    Cancer Maternal Grandmother        mouth   Prostate cancer Maternal Grandfather        dx 39s   Colon cancer Maternal Grandfather        dx 80s   Lung cancer Maternal Uncle    Cancer Maternal Uncle        possibly kidney   Uterine cancer Paternal Aunt    Cancer Paternal Aunt        reproductive   Kidney cancer Paternal Uncle    Breast cancer Cousin    Breast cancer Cousin    Colon cancer Cousin        dx 59s    ADVANCED DIRECTIVES (Y/N):  N  HEALTH MAINTENANCE: Social History   Tobacco Use   Smoking status: Some Days    Packs/day: 1.00    Types: Cigarettes   Smokeless tobacco: Never  Vaping Use   Vaping Use: Never used  Substance Use Topics   Alcohol use: Yes    Alcohol/week: 1.0 standard drink    Types: 1 Cans of beer per week   Drug use:  No     Colonoscopy:  PAP:  Bone density:  Lipid panel:  No Known Allergies  Current Outpatient Medications  Medication Sig Dispense Refill   ALPRAZolam (XANAX) 0.5 MG tablet Take 1 tablet (0.5 mg total) by mouth 2 (two) times daily as needed for anxiety. 60 tablet 0   cyclobenzaprine (FLEXERIL) 10 MG tablet TAKE ONE TABLET (10 MG) BY MOUTH 3 TIMESDAILY AS NEEDED FOR MUSCLE SPASMS 60 tablet 0   ibuprofen (ADVIL,MOTRIN) 100 MG tablet Take 800 mg by mouth every 6 (six) hours as needed.     ketoconazole (NIZORAL) 2 % shampoo Apply 1 application topically as directed. 3 times per week wash from scalp down for 6 weeks then once monthly for prevention. Leave on for a few minutes then rinse. 120 mL 6    lactulose (CHRONULAC) 10 GM/15ML solution Take 15-30 mLs (10-20 g total) by mouth 2 (two) times daily as needed for severe constipation. 236 mL 0   lidocaine-prilocaine (EMLA) cream Apply to affected area once 30 g 3   loperamide (IMODIUM A-D) 2 MG tablet Take 2 mg by mouth 4 (four) times daily as needed for diarrhea or loose stools.     ondansetron (ZOFRAN) 8 MG tablet Take 1 tablet (8 mg total) by mouth every 8 (eight) hours as needed for nausea or vomiting. 45 tablet 0   oxyCODONE-acetaminophen (PERCOCET/ROXICET) 5-325 MG tablet TAKE 1-2 TABLETS BY MOUTH EVERY 4 HOURS AS NEEDED FOR SEVERE PAIN. 90 tablet 0   OXYCONTIN 15 MG 12 hr tablet TAKE 1 TABLET BY MOUTH EVERY 8 HOURS 90 tablet 0   polyethylene glycol (MIRALAX / GLYCOLAX) 17 g packet Take 17 g by mouth daily.     prochlorperazine (COMPAZINE) 10 MG tablet TAKE ONE TABLET BY MOUTH EVERY 6 HOURS AS NEEDED FOR NAUSEA / VOMITING 60 tablet 1   senna (SENOKOT) 8.6 MG TABS tablet Take 1 tablet (8.6 mg total) by mouth daily. 120 tablet 3   Sennosides-Docusate Sodium 8.6-50 MG CAPS Take 1 tablet by mouth as needed for constipation.     tamsulosin (FLOMAX) 0.4 MG CAPS capsule Take 1 capsule (0.4 mg total) by mouth daily. (Patient not taking: Reported on 05/08/2021) 90 capsule 2   No current facility-administered medications for this visit.    OBJECTIVE: There were no vitals filed for this visit.    There is no height or weight on file to calculate BMI.    ECOG FS:1 - Symptomatic but completely ambulatory  General: Well-developed, well-nourished, no acute distress. Eyes: Pink conjunctiva, anicteric sclera. HEENT: Normocephalic, moist mucous membranes. Lungs: No audible wheezing or coughing. Heart: Regular rate and rhythm. Abdomen: Soft, nontender, no obvious distention.  Colostomy noted. Musculoskeletal: No edema, cyanosis, or clubbing. Neuro: Alert, answering all questions appropriately. Cranial nerves grossly intact. Skin: No rashes or  petechiae noted. Psych: Normal affect.  LAB RESULTS:  Lab Results  Component Value Date   NA 135 05/02/2021   K 4.2 05/02/2021   CL 102 05/02/2021   CO2 27 05/02/2021   GLUCOSE 93 05/02/2021   BUN 9 05/02/2021   CREATININE 0.79 05/02/2021   CALCIUM 9.2 05/02/2021   PROT 7.6 05/02/2021   ALBUMIN 4.3 05/02/2021   AST 16 05/02/2021   ALT 12 05/02/2021   ALKPHOS 91 05/02/2021   BILITOT 0.3 05/02/2021   GFRNONAA >60 05/02/2021    Lab Results  Component Value Date   WBC 8.7 05/02/2021   NEUTROABS 6.4 05/02/2021   HGB 12.3 (L) 05/02/2021  HCT 37.0 (L) 05/02/2021   MCV 97.1 05/02/2021   PLT 259 05/02/2021     STUDIES: No results found.  ASSESSMENT: Pathologic stage IIa rectal cancer, stage I clear cell carcinoma kidney.   PLAN:    1.  Pathologic stage IIa rectal cancer:  Patient completed his neoadjuvant treatment with chemotherapy and XRT on August 17, 2020.  He subsequently underwent treatment with chemotherapy only using FOLFOX and completed 6 cycles on December 12, 2020.  He underwent surgical resection on February 05, 2021.  CT scan results from May 02, 2021 reviewed independently and report as above with no obvious evidence of recurrent or progressive disease.  Patient CEA is within normal limits at 3.5.  No intervention is needed at this time.  Return to clinic in 3 months with repeat laboratory work, imaging, and further evaluation.  If imaging remains stable, patient likely can be transitioned to imaging and evaluation every 6 months. 2.  Weight loss: Improving. 3.  Pain: Improving.  Appreciate palliative care input.  Patient is now tapering his narcotics. 4.  Stage I clear cell carcinoma: Patient is status postresection on February 05, 2021.  He does not require adjuvant treatment. 5.  Urinary retention: Resolved.   6.  Anxiety: Continue Xanax as needed.   Patient expressed understanding and was in agreement with this plan. He also understands that He can call clinic  at any time with any questions, concerns, or complaints.    Cancer Staging  Rectal cancer [facility]) Staging form: Colon and Rectum, AJCC 8th Edition - Clinical stage from 06/22/2020: Stage IIIB (cT3, cN1a, cM0) - Signed by Lloyd Huger, MD on 06/22/2020 Stage prefix: Initial diagnosis Total positive nodes: 1   Lloyd Huger, MD   08/13/2021 7:07 AM

## 2021-08-14 ENCOUNTER — Inpatient Hospital Stay: Payer: Self-pay | Admitting: Oncology

## 2021-08-22 ENCOUNTER — Other Ambulatory Visit: Payer: Self-pay

## 2021-08-22 ENCOUNTER — Inpatient Hospital Stay: Payer: Self-pay | Attending: Oncology

## 2021-08-22 ENCOUNTER — Ambulatory Visit
Admission: RE | Admit: 2021-08-22 | Discharge: 2021-08-22 | Disposition: A | Discharge status: Home / Self Care | Payer: Self-pay | Source: Ambulatory Visit | Attending: Oncology | Admitting: Oncology

## 2021-08-22 DIAGNOSIS — Z85048 Personal history of other malignant neoplasm of rectum, rectosigmoid junction, and anus: Secondary | ICD-10-CM | POA: Insufficient documentation

## 2021-08-22 DIAGNOSIS — Z905 Acquired absence of kidney: Secondary | ICD-10-CM | POA: Insufficient documentation

## 2021-08-22 DIAGNOSIS — C20 Malignant neoplasm of rectum: Secondary | ICD-10-CM

## 2021-08-22 DIAGNOSIS — Z933 Colostomy status: Secondary | ICD-10-CM | POA: Insufficient documentation

## 2021-08-22 DIAGNOSIS — F419 Anxiety disorder, unspecified: Secondary | ICD-10-CM | POA: Insufficient documentation

## 2021-08-22 DIAGNOSIS — Z800 Family history of malignant neoplasm of digestive organs: Secondary | ICD-10-CM | POA: Insufficient documentation

## 2021-08-22 DIAGNOSIS — Z720 Tobacco use: Secondary | ICD-10-CM | POA: Insufficient documentation

## 2021-08-22 DIAGNOSIS — Z801 Family history of malignant neoplasm of trachea, bronchus and lung: Secondary | ICD-10-CM | POA: Insufficient documentation

## 2021-08-22 DIAGNOSIS — Z85528 Personal history of other malignant neoplasm of kidney: Secondary | ICD-10-CM | POA: Insufficient documentation

## 2021-08-22 DIAGNOSIS — Z803 Family history of malignant neoplasm of breast: Secondary | ICD-10-CM | POA: Insufficient documentation

## 2021-08-22 DIAGNOSIS — Z8051 Family history of malignant neoplasm of kidney: Secondary | ICD-10-CM | POA: Insufficient documentation

## 2021-08-22 DIAGNOSIS — Z8042 Family history of malignant neoplasm of prostate: Secondary | ICD-10-CM | POA: Insufficient documentation

## 2021-08-22 LAB — COMPREHENSIVE METABOLIC PANEL
ALT: 14 U/L (ref 0–44)
AST: 20 U/L (ref 15–41)
Albumin: 4.3 g/dL (ref 3.5–5.0)
Alkaline Phosphatase: 87 U/L (ref 38–126)
Anion gap: 10 (ref 5–15)
BUN: 8 mg/dL (ref 6–20)
CO2: 26 mmol/L (ref 22–32)
Calcium: 9.6 mg/dL (ref 8.9–10.3)
Chloride: 101 mmol/L (ref 98–111)
Creatinine, Ser: 0.84 mg/dL (ref 0.61–1.24)
GFR, Estimated: >60 mL/min (ref >60)
Glucose, Bld: 93 mg/dL (ref 70–99)
Potassium: 4.5 mmol/L (ref 3.5–5.1)
Sodium: 137 mmol/L (ref 135–145)
Total Bilirubin: 0.5 mg/dL (ref 0.3–1.2)
Total Protein: 7.6 g/dL (ref 6.5–8.1)

## 2021-08-22 LAB — CBC WITH DIFFERENTIAL/PLATELET
Abs Immature Granulocytes: 0.04 10*3/uL (ref 0.00–0.07)
Basophils Absolute: 0 10*3/uL (ref 0.0–0.1)
Basophils Relative: 0 %
Eosinophils Absolute: 0.3 10*3/uL (ref 0.0–0.5)
Eosinophils Relative: 3 %
HCT: 39.7 % (ref 39.0–52.0)
Hemoglobin: 13.5 g/dL (ref 13.0–17.0)
Immature Granulocytes: 0 %
Lymphocytes Relative: 16 %
Lymphs Abs: 1.5 10*3/uL (ref 0.7–4.0)
MCH: 32.1 pg (ref 26.0–34.0)
MCHC: 34 g/dL (ref 30.0–36.0)
MCV: 94.5 fL (ref 80.0–100.0)
Monocytes Absolute: 0.5 10*3/uL (ref 0.1–1.0)
Monocytes Relative: 5 %
Neutro Abs: 6.8 10*3/uL (ref 1.7–7.7)
Neutrophils Relative %: 76 %
Platelets: 256 10*3/uL (ref 150–400)
RBC: 4.2 MIL/uL — ABNORMAL LOW (ref 4.22–5.81)
RDW: 12.8 % (ref 11.5–15.5)
WBC: 9.1 10*3/uL (ref 4.0–10.5)
nRBC: 0 % (ref 0.0–0.2)

## 2021-08-22 MED ORDER — HEPARIN SOD (PORK) LOCK FLUSH 100 UNIT/ML IV SOLN
500.0000 [IU] | Freq: Once | INTRAVENOUS | Status: AC
  Administered 2021-08-22: 500 [IU] via INTRAVENOUS
  Filled 2021-08-22: qty 5

## 2021-08-22 MED ORDER — IOHEXOL 300 MG/ML  SOLN
100.0000 mL | Freq: Once | INTRAMUSCULAR | Status: AC | PRN
  Administered 2021-08-22: 100 mL via INTRAVENOUS

## 2021-08-22 MED ORDER — SODIUM CHLORIDE 0.9% FLUSH
10.0000 mL | Freq: Once | INTRAVENOUS | Status: AC
  Administered 2021-08-22: 10 mL via INTRAVENOUS
  Filled 2021-08-22: qty 10

## 2021-08-24 NOTE — Progress Notes (Signed)
Malik Lynch  Telephone:(336) [phone] [phone]) [phone]  ID: RECO SHONK OB: 1971/01/01  MR#: [id number]  BJS#:[id number]  Patient Care Team: Leonard Downing, MD as PCP - General (Family Medicine) [name], RN as Oncology Nurse Navigator  CHIEF COMPLAINT: Pathologic stage IIa rectal cancer, stage I clear cell carcinoma kidney.  INTERVAL HISTORY: Patient returns to clinic today for further evaluation and discussion of his imaging results.  He continues to have weakness and fatigue, but states this continues to improve.  His pain is significantly better and he is using decreased narcotics.  He continues to have issues with his colostomy.  He has no neurologic complaints. He denies any recent fevers or illnesses. He has no chest pain, shortness of breath, cough, or hemoptysis.  He denies any nausea, vomiting, constipation, or diarrhea.  He has no urinary complaints.  Patient offers no further specific complaints today.  REVIEW OF SYSTEMS:   Review of Systems  Constitutional:  Positive for malaise/fatigue. Negative for fever and weight loss.  Respiratory: Negative.  Negative for cough, hemoptysis and shortness of breath.   Cardiovascular: Negative.  Negative for chest pain and leg swelling.  Gastrointestinal:  Negative for abdominal pain, blood in stool, diarrhea and melena.  Genitourinary: Negative.  Negative for urgency.  Musculoskeletal: Negative.  Negative for back pain.  Skin: Negative.  Negative for rash.  Neurological:  Positive for weakness. Negative for dizziness, focal weakness and headaches.  Psychiatric/Behavioral:  The patient is not nervous/anxious and does not have insomnia.    As per HPI. Otherwise, a complete review of systems is negative.  PAST MEDICAL HISTORY: Past Medical History:  Diagnosis Date   Family history of breast cancer    Family history of colon cancer    Family history of kidney cancer    Family history of prostate  cancer    Family history of uterine cancer     PAST SURGICAL HISTORY: Past Surgical History:  Procedure Laterality Date   IR IMAGING GUIDED PORT INSERTION  09/29/2020    FAMILY HISTORY: Family History  Problem Relation Age of Onset   Throat cancer Mother 51   Lymphoma Maternal Aunt    Kidney cancer Maternal Uncle    Breast cancer Paternal Aunt    Colon cancer Paternal Uncle    Lung cancer Maternal Grandmother    Cancer Maternal Grandmother        mouth   Prostate cancer Maternal Grandfather        dx 80s   Colon cancer Maternal Grandfather        dx 80s   Lung cancer Maternal Uncle    Cancer Maternal Uncle        possibly kidney   Uterine cancer Paternal Aunt    Cancer Paternal Aunt        reproductive   Kidney cancer Paternal Uncle    Breast cancer Cousin    Breast cancer Cousin    Colon cancer Cousin        dx 90s    ADVANCED DIRECTIVES (Y/N):  N  HEALTH MAINTENANCE: Social History   Tobacco Use   Smoking status: Some Days    Packs/day: 1.00    Types: Cigarettes   Smokeless tobacco: Never  Vaping Use   Vaping Use: Never used  Substance Use Topics   Alcohol use: Yes    Alcohol/week: 1.0 standard drink    Types: 1 Cans of beer per week   Drug use: No  Colonoscopy:  PAP:  Bone density:  Lipid panel:  No Known Allergies  Current Outpatient Medications  Medication Sig Dispense Refill   ALPRAZolam (XANAX) 0.5 MG tablet Take 1 tablet (0.5 mg total) by mouth 2 (two) times daily as needed for anxiety. 60 tablet 0   cyclobenzaprine (FLEXERIL) 10 MG tablet TAKE ONE TABLET (10 MG) BY MOUTH 3 TIMESDAILY AS NEEDED FOR MUSCLE SPASMS 60 tablet 0   ibuprofen (ADVIL,MOTRIN) 100 MG tablet Take 800 mg by mouth every 6 (six) hours as needed.     ketoconazole (NIZORAL) 2 % shampoo Apply 1 application topically as directed. 3 times per week wash from scalp down for 6 weeks then once monthly for prevention. Leave on for a few minutes then rinse. 120 mL 6   lactulose  (CHRONULAC) 10 GM/15ML solution Take 15-30 mLs (10-20 g total) by mouth 2 (two) times daily as needed for severe constipation. 236 mL 0   lidocaine-prilocaine (EMLA) cream Apply to affected area once 30 g 3   loperamide (IMODIUM A-D) 2 MG tablet Take 2 mg by mouth 4 (four) times daily as needed for diarrhea or loose stools.     ondansetron (ZOFRAN) 8 MG tablet Take 1 tablet (8 mg total) by mouth every 8 (eight) hours as needed for nausea or vomiting. 45 tablet 0   oxyCODONE-acetaminophen (PERCOCET/ROXICET) 5-325 MG tablet TAKE 1-2 TABLETS BY MOUTH EVERY 4 HOURS AS NEEDED FOR SEVERE PAIN. 90 tablet 0   OXYCONTIN 15 MG 12 hr tablet TAKE 1 TABLET BY MOUTH EVERY 8 HOURS 90 tablet 0   polyethylene glycol (MIRALAX / GLYCOLAX) 17 g packet Take 17 g by mouth daily.     prochlorperazine (COMPAZINE) 10 MG tablet TAKE ONE TABLET BY MOUTH EVERY 6 HOURS AS NEEDED FOR NAUSEA / VOMITING 60 tablet 1   senna (SENOKOT) 8.6 MG TABS tablet Take 1 tablet (8.6 mg total) by mouth daily. 120 tablet 3   Sennosides-Docusate Sodium 8.6-50 MG CAPS Take 1 tablet by mouth as needed for constipation.     tamsulosin (FLOMAX) 0.4 MG CAPS capsule Take 1 capsule (0.4 mg total) by mouth daily. (Patient not taking: Reported on 05/08/2021) 90 capsule 2   No current facility-administered medications for this visit.    OBJECTIVE: Vitals:   08/28/21 1348  BP: (!) 132/91  Pulse: 85  Temp: 97.8 F (36.6 C)     Body mass index is 24.78 kg/m.    ECOG FS:0 - Asymptomatic  General: Well-developed, well-nourished, no acute distress. Eyes: Pink conjunctiva, anicteric sclera. HEENT: Normocephalic, moist mucous membranes. Lungs: No audible wheezing or coughing. Heart: Regular rate and rhythm. Abdomen: Soft, nontender, no obvious distention.  Colostomy bag noted. Musculoskeletal: No edema, cyanosis, or clubbing. Neuro: Alert, answering all questions appropriately. Cranial nerves grossly intact. Skin: No rashes or petechiae  noted. Psych: Normal affect.  LAB RESULTS:  Lab Results  Component Value Date   NA 137 08/22/2021   K 4.5 08/22/2021   CL 101 08/22/2021   CO2 26 08/22/2021   GLUCOSE 93 08/22/2021   BUN 8 08/22/2021   CREATININE 0.84 08/22/2021   CALCIUM 9.6 08/22/2021   PROT 7.6 08/22/2021   ALBUMIN 4.3 08/22/2021   AST 20 08/22/2021   ALT 14 08/22/2021   ALKPHOS 87 08/22/2021   BILITOT 0.5 08/22/2021   GFRNONAA >60 08/22/2021    Lab Results  Component Value Date   WBC 9.1 08/22/2021   NEUTROABS 6.8 08/22/2021   HGB 13.5 08/22/2021   HCT 39.7  08/22/2021   MCV 94.5 08/22/2021   PLT 256 08/22/2021     STUDIES: CT CHEST ABDOMEN PELVIS W CONTRAST  Result Date: 08/23/2021 CLINICAL DATA:  Colorectal cancer, status post chemotherapy. EXAM: CT CHEST, ABDOMEN, AND PELVIS WITH CONTRAST TECHNIQUE: Multidetector CT imaging of the chest, abdomen and pelvis was performed following the standard protocol during bolus administration of intravenous contrast. RADIATION DOSE REDUCTION: This exam was performed according to the departmental dose-optimization program which includes automated exposure control, adjustment of the mA and/or kV according to patient size and/or use of iterative reconstruction technique. CONTRAST:  117mL OMNIPAQUE IOHEXOL 300 MG/ML  SOLN COMPARISON:  Multiple priors including most recent CT May 02, 2021 FINDINGS: CT CHEST FINDINGS Cardiovascular: Accessed right chest Port-A-Cath with tip at the superior cavoatrial junction. Minimal aortic atherosclerosis without aneurysmal dilation. No central pulmonary embolus on this nondedicated study. Normal size heart. No significant pericardial effusion/thickening. Mediastinum/Nodes: No supraclavicular adenopathy. No discrete thyroid nodule. No pathologically enlarged mediastinal, hilar or axillary lymph nodes. Lungs/Pleura: Mild paraseptal and centrilobular emphysema. No suspicious pulmonary nodules or masses. No pleural effusion or  pneumothorax. Musculoskeletal: Thoracic spondylosis. Degenerative changes bilateral shoulders. No aggressive lytic or blastic lesion of bone. No suspicious chest wall mass. CT ABDOMEN PELVIS FINDINGS Hepatobiliary: No suspicious hepatic lesion. Gallbladder is unremarkable. No biliary ductal dilation. Pancreas: No pancreatic ductal dilation or evidence of acute inflammation. Spleen: No splenomegaly or focal splenic lesion. Adrenals/Urinary Tract: Bilateral adrenal glands appear normal. Similar changes of partial left lobe lower pole nephrectomy without suspicious enhancing soft tissue nodularity in the surgical bed. Kidneys demonstrate symmetric enhancement and excretion of contrast material. No solid enhancing renal lesion. Urinary bladder is unremarkable for degree of distension. Stomach/Bowel: Radiopaque enteric contrast material traverses distal loops of small bowel. Stomach is moderately distended without abnormal wall thickening. Postsurgical changes of abdominal perineal resection with left anterior abdominal wall descending colostomy. Terminal ileum and appendix appear normal. Moderate volume of formed stool throughout the colon. Vascular/Lymphatic: Normal caliber abdominal aorta. Circumaortic left renal vein. No pathologically enlarged abdominal or pelvic lymph nodes. Reproductive: Prostate is unremarkable. Other: No significant abdominopelvic free fluid. No discrete omental or peritoneal nodularity. Musculoskeletal: L5-S1 degenerative disc disease. No aggressive lytic or blastic lesion of bone. IMPRESSION: 1. Stable examination status post left lower pole partial nephrectomy and abdominal perineal resection with end colostomy. 2. No new or progressive findings to suggest recurrence or metastatic disease within the chest, abdomen, or pelvis. 3. Aortic Atherosclerosis (ICD10-I70.0) and Emphysema (ICD10-J43.9). Electronically Signed   By: Dahlia Bailiff M.D.   On: 08/23/2021 16:25    ASSESSMENT: Pathologic  stage IIa rectal cancer, stage I clear cell carcinoma kidney.   PLAN:    1.  Pathologic stage IIa rectal cancer:  Patient completed his neoadjuvant treatment with chemotherapy and XRT on August 17, 2020.  He subsequently underwent treatment with chemotherapy only using FOLFOX and completed 6 cycles on December 12, 2020.  He underwent surgical resection on February 05, 2021.  CT scan results from August 22, 2021 reviewed independently and report as above with no obvious evidence of recurrent or progressive disease.  His most recent CEA was reported normal at 3.5.  No intervention is needed this.  Return to clinic in 6 months with repeat imaging and further evaluation.   2.  Weight loss: Resolved. 3.  Pain: Improving.  Appreciate palliative care input.  Patient continues to report tapering his narcotics.   4.  Stage I clear cell carcinoma: Patient is status postresection on  February 05, 2021.  He does not require adjuvant treatment. 5.  Urinary retention: Resolved.   6.  Anxiety: Patient does not complain of this today.  Continue Xanax as needed. 6.  Ostomy care: Patient was given a referral to ostomy clinic.   Patient expressed understanding and was in agreement with this plan. He also understands that He can call clinic at any time with any questions, concerns, or complaints.    Cancer Staging  Rectal cancer [facility]) Staging form: Colon and Rectum, AJCC 8th Edition - Clinical stage from 06/22/2020: Stage IIIB (cT3, cN1a, cM0) - Signed by Lloyd Huger, MD on 06/22/2020 Stage prefix: Initial diagnosis Total positive nodes: 1   Lloyd Huger, MD   08/28/2021 2:34 PM

## 2021-08-28 ENCOUNTER — Other Ambulatory Visit: Payer: Self-pay

## 2021-08-28 ENCOUNTER — Inpatient Hospital Stay (HOSPITAL_BASED_OUTPATIENT_CLINIC_OR_DEPARTMENT_OTHER): Payer: Self-pay | Admitting: Oncology

## 2021-08-28 ENCOUNTER — Encounter: Payer: Self-pay | Admitting: Oncology

## 2021-08-28 VITALS — BP 132/91 | HR 85 | Temp 97.8°F | Wt 193.0 lb

## 2021-08-28 DIAGNOSIS — C20 Malignant neoplasm of rectum: Secondary | ICD-10-CM

## 2021-08-28 DIAGNOSIS — Z800 Family history of malignant neoplasm of digestive organs: Secondary | ICD-10-CM

## 2021-08-28 DIAGNOSIS — Z7189 Other specified counseling: Secondary | ICD-10-CM

## 2021-08-28 NOTE — Progress Notes (Signed)
Pt is having pain where the incisions are located from surgery. He is not really sure how to get the bag to sit correctly on the stoma without it leaking through. Pt said that he feels really uneducated on the whole experience and feels like he has been left to the side on all of this information.

## 2021-08-29 ENCOUNTER — Telehealth: Payer: Self-pay

## 2021-08-29 NOTE — Telephone Encounter (Signed)
Reached out to Ostomy clinic and got pt an apt for 08/29/21 @ 9:00AM per Woodfin Ganja. ?Faxed over the referral to Wetzel Bjornstad at [facility] location and followed up with an email. ?  ?

## 2021-09-04 ENCOUNTER — Ambulatory Visit (HOSPITAL_COMMUNITY): Payer: Self-pay

## 2021-09-05 ENCOUNTER — Other Ambulatory Visit: Payer: Self-pay | Admitting: *Deleted

## 2021-09-05 MED ORDER — CYCLOBENZAPRINE HCL 10 MG PO TABS: ORAL_TABLET | ORAL | 0 refills | Status: DC

## 2021-09-05 MED ORDER — ALPRAZOLAM 0.5 MG PO TABS: 0.5000 mg | ORAL_TABLET | Freq: Two times a day (BID) | ORAL | 0 refills | Status: DC | PRN

## 2021-09-24 ENCOUNTER — Other Ambulatory Visit: Payer: Self-pay | Admitting: Oncology

## 2021-10-01 ENCOUNTER — Other Ambulatory Visit: Payer: Self-pay | Admitting: *Deleted

## 2021-10-01 MED ORDER — CYCLOBENZAPRINE HCL 10 MG PO TABS: ORAL_TABLET | ORAL | 0 refills | Status: DC

## 2021-10-01 MED ORDER — ALPRAZOLAM 0.5 MG PO TABS: 0.5000 mg | ORAL_TABLET | Freq: Two times a day (BID) | ORAL | 0 refills | Status: DC | PRN

## 2021-10-04 ENCOUNTER — Other Ambulatory Visit: Payer: Self-pay | Admitting: Emergency Medicine

## 2021-10-04 ENCOUNTER — Telehealth: Payer: Self-pay | Admitting: *Deleted

## 2021-10-04 MED ORDER — OXYCODONE HCL ER 10 MG PO T12A: 10.0000 mg | EXTENDED_RELEASE_TABLET | Freq: Two times a day (BID) | ORAL | 0 refills | Status: AC

## 2021-10-04 NOTE — Telephone Encounter (Signed)
Call returned to Amy and informed that prescription for 30 tabs has been sent to pharmacy. She thanked me for letting her know ?

## 2021-10-04 NOTE — Telephone Encounter (Signed)
Per Josh, 30 tabs of Oxycontin '10mg'$  to be taken every 12 hours prn has been pended and routed for Josh to sign.  ?

## 2021-10-04 NOTE — Telephone Encounter (Signed)
Amy called asking about the quantity of Oxycodone patient was given only being 1 tabs with directions of twice a day dosing and said that he most of the time is only taking 1 per day and asking why he was not given a month supply. She states she knows that we are trying to wean him off medicine and if this is how it is to be that is fine, but wanted to check about it ?

## 2021-10-09 ENCOUNTER — Inpatient Hospital Stay: Payer: Self-pay

## 2021-10-11 ENCOUNTER — Inpatient Hospital Stay: Payer: Self-pay | Attending: Oncology

## 2021-10-11 DIAGNOSIS — Z452 Encounter for adjustment and management of vascular access device: Secondary | ICD-10-CM | POA: Insufficient documentation

## 2021-10-11 DIAGNOSIS — Z95828 Presence of other vascular implants and grafts: Secondary | ICD-10-CM

## 2021-10-11 DIAGNOSIS — Z85048 Personal history of other malignant neoplasm of rectum, rectosigmoid junction, and anus: Secondary | ICD-10-CM | POA: Insufficient documentation

## 2021-10-11 DIAGNOSIS — Z85528 Personal history of other malignant neoplasm of kidney: Secondary | ICD-10-CM | POA: Insufficient documentation

## 2021-10-11 DIAGNOSIS — G893 Neoplasm related pain (acute) (chronic): Secondary | ICD-10-CM

## 2021-10-11 MED ORDER — HEPARIN SOD (PORK) LOCK FLUSH 100 UNIT/ML IV SOLN
500.0000 [IU] | Freq: Once | INTRAVENOUS | Status: AC
  Filled 2021-10-11: qty 5

## 2021-10-11 MED ORDER — SODIUM CHLORIDE 0.9% FLUSH
10.0000 mL | Freq: Once | INTRAVENOUS | Status: AC
  Filled 2021-10-11: qty 10

## 2021-10-11 MED ORDER — SODIUM CHLORIDE 0.9% FLUSH
10.0000 mL | Freq: Once | INTRAVENOUS | Status: AC
  Administered 2021-10-11: 10 mL via INTRAVENOUS
  Filled 2021-10-11: qty 10

## 2021-10-11 MED ORDER — HEPARIN SOD (PORK) LOCK FLUSH 100 UNIT/ML IV SOLN
500.0000 [IU] | Freq: Once | INTRAVENOUS | Status: AC
  Administered 2021-10-11: 500 [IU] via INTRAVENOUS
  Filled 2021-10-11: qty 5

## 2021-11-02 ENCOUNTER — Other Ambulatory Visit: Payer: Self-pay | Admitting: *Deleted

## 2021-11-03 MED ORDER — ALPRAZOLAM 0.5 MG PO TABS: 0.5000 mg | ORAL_TABLET | Freq: Two times a day (BID) | ORAL | 0 refills | Status: DC | PRN

## 2021-11-03 MED ORDER — CYCLOBENZAPRINE HCL 10 MG PO TABS: ORAL_TABLET | ORAL | 0 refills | Status: DC

## 2021-11-05 ENCOUNTER — Encounter: Payer: Self-pay | Admitting: Oncology

## 2021-11-20 ENCOUNTER — Inpatient Hospital Stay: Payer: Self-pay | Attending: Oncology

## 2021-11-20 DIAGNOSIS — Z452 Encounter for adjustment and management of vascular access device: Secondary | ICD-10-CM | POA: Insufficient documentation

## 2021-11-20 DIAGNOSIS — Z85048 Personal history of other malignant neoplasm of rectum, rectosigmoid junction, and anus: Secondary | ICD-10-CM | POA: Insufficient documentation

## 2021-11-20 DIAGNOSIS — Z85528 Personal history of other malignant neoplasm of kidney: Secondary | ICD-10-CM | POA: Insufficient documentation

## 2021-11-20 DIAGNOSIS — Z95828 Presence of other vascular implants and grafts: Secondary | ICD-10-CM

## 2021-11-20 MED ORDER — HEPARIN SOD (PORK) LOCK FLUSH 100 UNIT/ML IV SOLN
500.0000 [IU] | Freq: Once | INTRAVENOUS | Status: AC
  Administered 2021-11-20: 500 [IU] via INTRAVENOUS
  Filled 2021-11-20: qty 5

## 2021-11-20 MED ORDER — SODIUM CHLORIDE 0.9% FLUSH
10.0000 mL | Freq: Once | INTRAVENOUS | Status: AC
  Administered 2021-11-20: 10 mL via INTRAVENOUS
  Filled 2021-11-20: qty 10

## 2021-12-05 ENCOUNTER — Other Ambulatory Visit: Payer: Self-pay | Admitting: Oncology

## 2022-01-03 ENCOUNTER — Inpatient Hospital Stay: Payer: Self-pay | Attending: Oncology

## 2022-01-03 DIAGNOSIS — Z85528 Personal history of other malignant neoplasm of kidney: Secondary | ICD-10-CM | POA: Insufficient documentation

## 2022-01-03 DIAGNOSIS — Z452 Encounter for adjustment and management of vascular access device: Secondary | ICD-10-CM | POA: Insufficient documentation

## 2022-01-03 DIAGNOSIS — Z85048 Personal history of other malignant neoplasm of rectum, rectosigmoid junction, and anus: Secondary | ICD-10-CM | POA: Insufficient documentation

## 2022-01-03 DIAGNOSIS — Z95828 Presence of other vascular implants and grafts: Secondary | ICD-10-CM

## 2022-01-03 MED ORDER — SODIUM CHLORIDE 0.9% FLUSH
10.0000 mL | INTRAVENOUS | Status: DC | PRN
  Administered 2022-01-03: 10 mL via INTRAVENOUS
  Filled 2022-01-03: qty 10

## 2022-01-03 MED ORDER — HEPARIN SOD (PORK) LOCK FLUSH 100 UNIT/ML IV SOLN
500.0000 [IU] | Freq: Once | INTRAVENOUS | Status: AC
  Administered 2022-01-03: 500 [IU] via INTRAVENOUS
  Filled 2022-01-03: qty 5

## 2022-01-08 ENCOUNTER — Other Ambulatory Visit: Payer: Self-pay | Admitting: Oncology

## 2022-01-22 ENCOUNTER — Encounter (HOSPITAL_COMMUNITY): Payer: Self-pay | Admitting: Nurse Practitioner

## 2022-02-08 ENCOUNTER — Telehealth: Payer: Self-pay | Admitting: *Deleted

## 2022-02-08 ENCOUNTER — Other Ambulatory Visit: Payer: Self-pay | Admitting: *Deleted

## 2022-02-08 DIAGNOSIS — C20 Malignant neoplasm of rectum: Secondary | ICD-10-CM

## 2022-02-08 NOTE — Telephone Encounter (Signed)
Pt called to say that he just had a ct of abd and pelvis at [facility] on 8/7. He says the only thing he needs now is the chest. I spoke to Beckey Rutter , dr Grayland Ormond already went home. She says it is ok to get chest only. I called the scheduling dept and we changed the order and the pt knows that he goes to [facility] location 8/14 at 9 am. He will only get the chest and nothing else and finnegan can review the scans from [facility] and then look at the ct of chest from here.

## 2022-02-08 NOTE — Telephone Encounter (Signed)
Call from Amy asking that only the CT Chest be done on Monday because he just had CTAP done at [facility] ([facility]) and he does not need another one

## 2022-02-09 NOTE — Progress Notes (Unsigned)
[place]  Telephone:(336) [phone] Fax:(336) [phone]  ID: CLARE CASTO OB: 1970/08/02  MR#: [id number]  WUX#:[id number]  Patient Care Team: Leonard Downing, MD as PCP - General (Family Medicine) [name], RN as Oncology Nurse Navigator Grayland Ormond, Kathlene November, MD as Consulting Physician (Oncology)  CHIEF COMPLAINT: Pathologic stage IIa rectal cancer, stage I clear cell carcinoma kidney.  INTERVAL HISTORY: Patient returns to clinic today for further evaluation and discussion of his imaging results.  He currently feels well and is asymptomatic.  He does not complain of pain.  He denies any weakness or fatigue and is back to work.  He has no neurologic complaints. He denies any recent fevers or illnesses. He has no chest pain, shortness of breath, cough, or hemoptysis.  He denies any nausea, vomiting, constipation, or diarrhea.  He has no urinary complaints.  Patient offers no further specific complaints today.  REVIEW OF SYSTEMS:   Review of Systems  Constitutional: Negative.  Negative for fever, malaise/fatigue and weight loss.  Respiratory: Negative.  Negative for cough, hemoptysis and shortness of breath.   Cardiovascular: Negative.  Negative for chest pain and leg swelling.  Gastrointestinal:  Negative for abdominal pain, blood in stool, diarrhea and melena.  Genitourinary: Negative.  Negative for urgency.  Musculoskeletal: Negative.  Negative for back pain.  Skin: Negative.  Negative for rash.  Neurological: Negative.  Negative for dizziness, focal weakness, weakness and headaches.  Psychiatric/Behavioral:  The patient is not nervous/anxious and does not have insomnia.     As per HPI. Otherwise, a complete review of systems is negative.  PAST MEDICAL HISTORY: Past Medical History:  Diagnosis Date   Family history of breast cancer    Family history of colon cancer    Family history of kidney cancer    Family history of prostate cancer     Family history of uterine cancer     PAST SURGICAL HISTORY: Past Surgical History:  Procedure Laterality Date   IR IMAGING GUIDED PORT INSERTION  09/29/2020    FAMILY HISTORY: Family History  Problem Relation Age of Onset   Throat cancer Mother 33   Lymphoma Maternal Aunt    Kidney cancer Maternal Uncle    Breast cancer Paternal Aunt    Colon cancer Paternal Uncle    Lung cancer Maternal Grandmother    Cancer Maternal Grandmother        mouth   Prostate cancer Maternal Grandfather        dx 57s   Colon cancer Maternal Grandfather        dx 80s   Lung cancer Maternal Uncle    Cancer Maternal Uncle        possibly kidney   Uterine cancer Paternal Aunt    Cancer Paternal Aunt        reproductive   Kidney cancer Paternal Uncle    Breast cancer Cousin    Breast cancer Cousin    Colon cancer Cousin        dx 28s    ADVANCED DIRECTIVES (Y/N):  N  HEALTH MAINTENANCE: Social History   Tobacco Use   Smoking status: Some Days    Packs/day: 1.00    Types: Cigarettes   Smokeless tobacco: Never  Vaping Use   Vaping Use: Never used  Substance Use Topics   Alcohol use: Yes    Alcohol/week: 1.0 standard drink of alcohol    Types: 1 Cans of beer per week   Drug use: No  Colonoscopy:  PAP:  Bone density:  Lipid panel:  No Known Allergies  Current Outpatient Medications  Medication Sig Dispense Refill   ALPRAZolam (XANAX) 0.5 MG tablet TAKE ONE TABLET BY MOUTH TWICE DAILY AS NEEDED FOR ANXIETY 60 tablet 0   cyclobenzaprine (FLEXERIL) 10 MG tablet TAKE ONE TABLET BY MOUTH 3 TIMES DAILY AS NEEDED FOR MUSCLE SPASMS 60 tablet 0   ibuprofen (ADVIL,MOTRIN) 100 MG tablet Take 800 mg by mouth every 6 (six) hours as needed.     ketoconazole (NIZORAL) 2 % shampoo Apply 1 application topically as directed. 3 times per week wash from scalp down for 6 weeks then once monthly for prevention. Leave on for a few minutes then rinse. 120 mL 6   lactulose (CHRONULAC) 10 GM/15ML  solution Take 15-30 mLs (10-20 g total) by mouth 2 (two) times daily as needed for severe constipation. 236 mL 0   loperamide (IMODIUM A-D) 2 MG tablet Take 2 mg by mouth 4 (four) times daily as needed for diarrhea or loose stools.     ondansetron (ZOFRAN) 8 MG tablet Take 1 tablet (8 mg total) by mouth every 8 (eight) hours as needed for nausea or vomiting. 45 tablet 0   polyethylene glycol (MIRALAX / GLYCOLAX) 17 g packet Take 17 g by mouth daily.     prochlorperazine (COMPAZINE) 10 MG tablet TAKE ONE TABLET BY MOUTH EVERY 6 HOURS AS NEEDED FOR NAUSEA / VOMITING 60 tablet 1   senna (SENOKOT) 8.6 MG TABS tablet Take 1 tablet (8.6 mg total) by mouth daily. 120 tablet 3   Sennosides-Docusate Sodium 8.6-50 MG CAPS Take 1 tablet by mouth as needed for constipation.     lidocaine-prilocaine (EMLA) cream Apply to affected area once (Patient not taking: Reported on 02/14/2022) 30 g 3   oxyCODONE (OXYCONTIN) 10 mg 12 hr tablet Take 1 tablet (10 mg total) by mouth every 12 (twelve) hours. (Patient not taking: Reported on 02/14/2022) 30 tablet 0   oxyCODONE-acetaminophen (PERCOCET/ROXICET) 5-325 MG tablet TAKE 1-2 TABLETS BY MOUTH EVERY 4 HOURS AS NEEDED FOR SEVERE PAIN. (Patient not taking: Reported on 02/14/2022) 90 tablet 0   tamsulosin (FLOMAX) 0.4 MG CAPS capsule Take 1 capsule (0.4 mg total) by mouth daily. (Patient not taking: Reported on 05/08/2021) 90 capsule 2   No current facility-administered medications for this visit.    OBJECTIVE: Vitals:   02/14/22 1107  BP: (!) 149/97  Pulse: (!) 115  Temp: 97.6 F (36.4 C)     Body mass index is 27.07 kg/m.    ECOG FS:0 - Asymptomatic  General: Well-developed, well-nourished, no acute distress. Eyes: Pink conjunctiva, anicteric sclera. HEENT: Normocephalic, moist mucous membranes. Lungs: No audible wheezing or coughing. Heart: Regular rate and rhythm. Abdomen: Soft, nontender, no obvious distention.  Colostomy noted. Musculoskeletal: No edema,  cyanosis, or clubbing. Neuro: Alert, answering all questions appropriately. Cranial nerves grossly intact. Skin: No rashes or petechiae noted. Psych: Normal affect.  LAB RESULTS:  Lab Results  Component Value Date   NA 137 08/22/2021   K 4.5 08/22/2021   CL 101 08/22/2021   CO2 26 08/22/2021   GLUCOSE 93 08/22/2021   BUN 8 08/22/2021   CREATININE 0.84 08/22/2021   CALCIUM 9.6 08/22/2021   PROT 7.6 08/22/2021   ALBUMIN 4.3 08/22/2021   AST 20 08/22/2021   ALT 14 08/22/2021   ALKPHOS 87 08/22/2021   BILITOT 0.5 08/22/2021   GFRNONAA >60 08/22/2021    Lab Results  Component Value Date  WBC 9.1 08/22/2021   NEUTROABS 6.8 08/22/2021   HGB 13.5 08/22/2021   HCT 39.7 08/22/2021   MCV 94.5 08/22/2021   PLT 256 08/22/2021     STUDIES: CT Chest W Contrast  Result Date: 02/11/2022 CLINICAL DATA:  Rectal cancer follow-up. EXAM: CT CHEST WITH CONTRAST TECHNIQUE: Multidetector CT imaging of the chest was performed during intravenous contrast administration. RADIATION DOSE REDUCTION: This exam was performed according to the departmental dose-optimization program which includes automated exposure control, adjustment of the mA and/or kV according to patient size and/or use of iterative reconstruction technique. CONTRAST:  41m OMNIPAQUE IOHEXOL 300 MG/ML  SOLN COMPARISON:  CT chest abdomen and pelvis 08/22/2021 FINDINGS: Cardiovascular: No significant vascular findings. Normal heart size. No pericardial effusion. Right chest port catheter tip ends in the distal SVC. Mediastinum/Nodes: No enlarged mediastinal, hilar, or axillary lymph nodes. Thyroid gland, trachea, and esophagus demonstrate no significant findings. Lungs/Pleura: Mild paraseptal emphysematous changes present. 2 mm ground-glass nodule in the left upper lobe image 3/23 is new from prior. The lungs are otherwise clear. There is no pleural effusion or pneumothorax. Upper Abdomen: No acute abnormality. Musculoskeletal: No chest wall  abnormality. No acute or significant osseous findings. IMPRESSION: 1. Single new 2 mm ground-glass nodule in the left upper lobe. No follow-up recommended. This recommendation follows the consensus statement: Guidelines for Management of Incidental Pulmonary Nodules Detected on CT Images: From the Fleischner Society 2017; Radiology 2017; 284:228-243. 2. Mild emphysema. Emphysema (ICD10-J43.9). Electronically Signed   By: ARonney AstersM.D.   On: 02/11/2022 15:57    ASSESSMENT: Pathologic stage IIa rectal cancer, stage I clear cell carcinoma kidney.   PLAN:    1.  Pathologic stage IIa rectal cancer:  Patient completed his neoadjuvant treatment with chemotherapy and XRT on August 17, 2020.  He subsequently underwent treatment with chemotherapy only using FOLFOX and completed 6 cycles on December 12, 2020.  He underwent surgical resection on February 05, 2021.  [facility]  [facility]hest CT results as above.  No obvious evidence of recurrent or progressive disease.  CEA continues to be within normal limits.  No intervention is needed at this time.  Will continue imaging of abdomen and pelvis every 6 months until he is 2 years removed from his surgery and then likely transition to yearly evaluation.  Return to clinic in 6 months for laboratory work imaging and evaluation.   2.  Weight loss: Resolved.  Patient is now gaining weight. 3.  Pain: Resolved.  Patient only takes narcotics intermittently. 4.  Stage I clear cell carcinoma: Patient is status postresection on February 05, 2021.  He does not require adjuvant treatment.  Imaging as above. 5.  Urinary retention: Resolved.   6.  Anxiety: Patient does not complain of this today.  Continue Xanax as needed. 7.  Ostomy care: Continue follow-up with ostomy clinic. 8.  Port: Okay for port removal.  Referral sent to IR.   Patient expressed understanding and was in agreement with this plan. He also understands that He can call clinic at any  time with any questions, concerns, or complaints.    Cancer Staging  Rectal cancer ([facility] Staging form: Colon and Rectum, AJCC 8th Edition - Clinical stage from 06/22/2020: Stage IIIB (cT3, cN1a, cM0) - Signed by FLloyd Huger MD on 06/22/2020 Stage prefix: Initial diagnosis Total positive nodes: 1   TLloyd Huger MD   02/14/2022 1:29 PM

## 2022-02-11 ENCOUNTER — Ambulatory Visit: Admission: RE | Admit: 2022-02-11 | Payer: Self-pay | Source: Ambulatory Visit

## 2022-02-11 ENCOUNTER — Ambulatory Visit
Admission: RE | Admit: 2022-02-11 | Discharge: 2022-02-11 | Disposition: A | Discharge status: Home / Self Care | Payer: Self-pay | Source: Ambulatory Visit | Attending: Nurse Practitioner | Admitting: Nurse Practitioner

## 2022-02-11 DIAGNOSIS — C20 Malignant neoplasm of rectum: Secondary | ICD-10-CM | POA: Insufficient documentation

## 2022-02-11 MED ORDER — IOHEXOL 300 MG/ML  SOLN
75.0000 mL | Freq: Once | INTRAMUSCULAR | Status: AC | PRN
  Administered 2022-02-11: 75 mL via INTRAVENOUS

## 2022-02-14 ENCOUNTER — Encounter: Payer: Self-pay | Admitting: Oncology

## 2022-02-14 ENCOUNTER — Inpatient Hospital Stay: Payer: Self-pay | Attending: Oncology | Admitting: Oncology

## 2022-02-14 VITALS — BP 149/97 | HR 115 | Temp 97.6°F | Wt 210.8 lb

## 2022-02-14 DIAGNOSIS — C20 Malignant neoplasm of rectum: Secondary | ICD-10-CM

## 2022-02-14 DIAGNOSIS — Z85528 Personal history of other malignant neoplasm of kidney: Secondary | ICD-10-CM | POA: Insufficient documentation

## 2022-02-14 DIAGNOSIS — Z85048 Personal history of other malignant neoplasm of rectum, rectosigmoid junction, and anus: Secondary | ICD-10-CM | POA: Insufficient documentation

## 2022-02-14 NOTE — Addendum Note (Signed)
Addended by: Delice Bison E on: 02/14/2022 02:25 PM   Modules accepted: Orders

## 2022-02-22 ENCOUNTER — Other Ambulatory Visit: Payer: Self-pay | Admitting: Oncology

## 2022-03-18 ENCOUNTER — Telehealth: Payer: Self-pay | Admitting: *Deleted

## 2022-03-18 NOTE — Telephone Encounter (Signed)
I faxed over a removal form a while back. I will refax and message Pamala Hurry and Marcie Bal when I do.

## 2022-03-18 NOTE — Telephone Encounter (Signed)
Amy called asking about how to get his port can be removed. She is asking for a return call

## 2022-03-19 ENCOUNTER — Telehealth: Payer: Self-pay

## 2022-03-19 NOTE — Telephone Encounter (Signed)
Error

## 2022-03-19 NOTE — Telephone Encounter (Signed)
He is scheduled for his Port Removal on Tues 9/26 at 1:30p with an arrival time of 12:30p. Patient is aware of the appointment time and date and expressed understanding. Made sure he was a ware to arrive an hour prior to his appointment time.

## 2022-03-25 ENCOUNTER — Other Ambulatory Visit (HOSPITAL_COMMUNITY): Payer: Self-pay | Admitting: Radiology

## 2022-03-25 DIAGNOSIS — C20 Malignant neoplasm of rectum: Secondary | ICD-10-CM

## 2022-03-25 NOTE — H&P (Signed)
Chief Complaint: Patient was seen in consultation today for tunneled catheter with port removal at the request of Finnegan,Timothy J  Referring Physician(s): Finnegan,Timothy J  Supervising Physician: Corrie Mckusick  Patient Status: [facility] - Out-pt  History of Present Illness: Malik Lynch is a 51 y.o. 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 Pt has completed treatment with oncology. Last CT scan showed no obvious evidence of recurrent or progressive disease. Pt was referred to IR for tunneled catheter with port removal as it is no longer needed.   Past Medical History:  Diagnosis Date   Family history of breast cancer    Family history of colon cancer    Family history of kidney cancer    Family history of prostate cancer    Family history of uterine cancer     Past Surgical History:  Procedure Laterality Date   IR IMAGING GUIDED PORT INSERTION  09/29/2020    Allergies: Patient has no known allergies.  Medications: Prior to Admission medications   Medication Sig Start Date End Date Taking? Authorizing Provider  ALPRAZolam Duanne Moron) 0.5 MG tablet TAKE ONE TABLET BY MOUTH TWICE DAILY AS NEEDED FOR ANXIETY 02/22/22   Borders, Kirt Boys, NP  cyclobenzaprine (FLEXERIL) 10 MG tablet TAKE ONE TABLET BY MOUTH 3 TIMES DAILY AS NEEDED FOR MUSCLE SPASMS 02/22/22   Borders, Kirt Boys, NP  ibuprofen (ADVIL,MOTRIN) 100 MG tablet Take 800 mg by mouth every 6 (six) hours as needed.    [provider]  ketoconazole (NIZORAL) 2 % shampoo Apply 1 application topically as directed. 3 times per week wash from scalp down for 6 weeks then once monthly for prevention. Leave on for a few minutes then rinse. 12/06/20   Ralene Bathe, MD  lactulose (CHRONULAC) 10 GM/15ML solution Take 15-30 mLs (10-20 g total) by mouth 2 (two) times daily as needed for severe constipation. 10/24/20   Borders, Kirt Boys, NP  lidocaine-prilocaine (EMLA) cream Apply to affected  area once Patient not taking: Reported on 02/14/2022 09/21/20   Lloyd Huger, MD  loperamide (IMODIUM A-D) 2 MG tablet Take 2 mg by mouth 4 (four) times daily as needed for diarrhea or loose stools.    [provider]  ondansetron (ZOFRAN) 8 MG tablet Take 1 tablet (8 mg total) by mouth every 8 (eight) hours as needed for nausea or vomiting. 08/10/20   Borders, Kirt Boys, NP  oxyCODONE (OXYCONTIN) 10 mg 12 hr tablet Take 1 tablet (10 mg total) by mouth every 12 (twelve) hours. Patient not taking: Reported on 02/14/2022 10/04/21   Borders, Kirt Boys, NP  oxyCODONE-acetaminophen (PERCOCET/ROXICET) 5-325 MG tablet TAKE 1-2 TABLETS BY MOUTH EVERY 4 HOURS AS NEEDED FOR SEVERE PAIN. Patient not taking: Reported on 02/14/2022 08/08/21   Lloyd Huger, MD  polyethylene glycol (MIRALAX / GLYCOLAX) 17 g packet Take 17 g by mouth daily.    [provider]  prochlorperazine (COMPAZINE) 10 MG tablet TAKE ONE TABLET BY MOUTH EVERY 6 HOURS AS NEEDED FOR NAUSEA / VOMITING 04/25/21   Borders, Kirt Boys, NP  senna (SENOKOT) 8.6 MG TABS tablet Take 1 tablet (8.6 mg total) by mouth daily. 07/13/20   Borders, Kirt Boys, NP  Sennosides-Docusate Sodium 8.6-50 MG CAPS Take 1 tablet by mouth as needed for constipation.    [provider]  tamsulosin (FLOMAX) 0.4 MG CAPS capsule Take 1 capsule (0.4 mg total) by mouth daily. Patient not taking: Reported on 05/08/2021 10/25/20  Borders, Kirt Boys, NP     Family History  Problem Relation Age of Onset   Throat cancer Mother 26   Lymphoma Maternal Aunt    Kidney cancer Maternal Uncle    Breast cancer Paternal Aunt    Colon cancer Paternal Uncle    Lung cancer Maternal Grandmother    Cancer Maternal Grandmother        mouth   Prostate cancer Maternal Grandfather        dx 71s   Colon cancer Maternal Grandfather        dx 80s   Lung cancer Maternal Uncle    Cancer Maternal Uncle        possibly kidney   Uterine cancer Paternal Aunt    Cancer  Paternal Aunt        reproductive   Kidney cancer Paternal Uncle    Breast cancer Cousin    Breast cancer Cousin    Colon cancer Cousin        dx 36s    Social History   Socioeconomic History   Marital status: Divorced    Spouse name: Not on file   Number of children: Not on file   Years of education: Not on file   Highest education level: Not on file  Occupational History   Not on file  Tobacco Use   Smoking status: Some Days    Packs/day: 1.00    Types: Cigarettes   Smokeless tobacco: Never  Vaping Use   Vaping Use: Never used  Substance and Sexual Activity   Alcohol use: Yes    Alcohol/week: 1.0 standard drink of alcohol    Types: 1 Cans of beer per week   Drug use: No   Sexual activity: Not Currently  Other Topics Concern   Not on file  Social History Narrative   Not on file   Social Determinants of Health   Financial Resource Strain: Not on file  Food Insecurity: Not on file  Transportation Needs: Not on file  Physical Activity: Not on file  Stress: Not on file  Social Connections: Not on file      Review of Systems: A 12 point ROS discussed and pertinent positives are indicated in the HPI above.  All other systems are negative.  Review of Systems  Vital Signs: There were no vitals taken for this visit.    Physical Exam  Imaging: No results found.  Labs:  CBC: Recent Labs    05/02/21 0920 08/22/21 0837  WBC 8.7 9.1  HGB 12.3* 13.5  HCT 37.0* 39.7  PLT 259 256    COAGS: No results for input(s): "INR", "APTT" in the last 8760 hours.  BMP: Recent Labs    05/02/21 0920 08/22/21 0837  NA 135 137  K 4.2 4.5  CL 102 101  CO2 27 26  GLUCOSE 93 93  BUN 9 8  CALCIUM 9.2 9.6  CREATININE 0.79 0.84  GFRNONAA >60 >60    LIVER FUNCTION TESTS: Recent Labs    05/02/21 0920 08/22/21 0837  BILITOT 0.3 0.5  AST 16 20  ALT 12 14  ALKPHOS 91 87  PROT 7.6 7.6  ALBUMIN 4.3 4.3    TUMOR MARKERS: No results for input(s): "AFPTM",  "CEA", "CA199", "CHROMGRNA" in the last 8760 hours.  Assessment and Plan: 51 yo male presents to IR today for tunneled catheter with port removal.  Risks and benefits of port-a-catheter removal was discussed with the patient including, but not limited to bleeding, infection,  pneumothorax and need for additional procedures.  All of the patient's questions were answered, patient is agreeable to proceed. Consent signed and in chart.    Thank you for this interesting consult.  I greatly enjoyed meeting ARLON BLEIER and look forward to participating in their care.  A copy of this report was sent to the requesting provider on this date.  Electronically Signed: Tyson Alias, NP 03/25/2022, 2:56 PM   I spent a total of {New EOFH:[id number]} {New Out-Pt:[id number]}  {Established Out-Pt:[id number]} in face to face in clinical consultation, greater than 50% of which was counseling/coordinating care for tunneled catheter with port removal.

## 2022-03-26 ENCOUNTER — Ambulatory Visit
Admission: RE | Admit: 2022-03-26 | Discharge: 2022-03-26 | Disposition: A | Discharge status: Home / Self Care | Payer: Self-pay | Source: Ambulatory Visit | Attending: Oncology | Admitting: Oncology

## 2022-03-26 ENCOUNTER — Other Ambulatory Visit: Payer: Self-pay

## 2022-03-26 ENCOUNTER — Other Ambulatory Visit: Payer: Self-pay | Admitting: *Deleted

## 2022-03-26 ENCOUNTER — Encounter: Payer: Self-pay | Admitting: Radiology

## 2022-03-26 DIAGNOSIS — Z452 Encounter for adjustment and management of vascular access device: Secondary | ICD-10-CM | POA: Insufficient documentation

## 2022-03-26 DIAGNOSIS — Z85048 Personal history of other malignant neoplasm of rectum, rectosigmoid junction, and anus: Secondary | ICD-10-CM | POA: Insufficient documentation

## 2022-03-26 DIAGNOSIS — Z85528 Personal history of other malignant neoplasm of kidney: Secondary | ICD-10-CM | POA: Insufficient documentation

## 2022-03-26 DIAGNOSIS — C20 Malignant neoplasm of rectum: Secondary | ICD-10-CM | POA: Insufficient documentation

## 2022-03-26 HISTORY — PX: IR REMOVAL TUN ACCESS W/ PORT W/O FL MOD SED: IMG2290

## 2022-03-26 MED ORDER — MIDAZOLAM HCL 2 MG/2ML IJ SOLN
INTRAMUSCULAR | Status: AC
  Filled 2022-03-26: qty 4

## 2022-03-26 MED ORDER — LIDOCAINE HCL 1 % IJ SOLN
INTRAMUSCULAR | Status: AC
  Filled 2022-03-26: qty 20

## 2022-03-26 MED ORDER — SODIUM CHLORIDE 0.9 % IV SOLN
INTRAVENOUS | Status: DC
  Filled 2022-03-26: qty 1000

## 2022-03-26 MED ORDER — FENTANYL CITRATE (PF) 100 MCG/2ML IJ SOLN
INTRAMUSCULAR | Status: AC | PRN
  Administered 2022-03-26: 50 ug via INTRAVENOUS

## 2022-03-26 MED ORDER — FENTANYL CITRATE (PF) 100 MCG/2ML IJ SOLN
INTRAMUSCULAR | Status: AC
  Filled 2022-03-26: qty 2

## 2022-03-26 NOTE — Telephone Encounter (Signed)
Pharmacy sent RF request for fax for Flexeril/xanax - patient does not have any f/u with Dr. Grayland Ormond until 08/13/22. I do not see any f/u with Josh.  Josh- please advise.

## 2022-03-26 NOTE — Procedures (Signed)
Interventional Radiology Procedure Note  Procedure: Removal of a right IJ approach single lumen PowerPort.    Complications: None Recommendations:  - Ok to shower tomorrow - Do not submerge for 7 days - Routine wound care   Signed,  Amyiah Gaba S. Jodi Kappes, DO   

## 2022-03-27 ENCOUNTER — Telehealth: Payer: Self-pay | Admitting: Hospice and Palliative Medicine

## 2022-03-27 MED ORDER — CYCLOBENZAPRINE HCL 10 MG PO TABS: 10.0000 mg | ORAL_TABLET | Freq: Every day | ORAL | 0 refills | Status: AC

## 2022-03-27 MED ORDER — ALPRAZOLAM 0.5 MG PO TABS
0.5000 mg | ORAL_TABLET | Freq: Two times a day (BID) | ORAL | 0 refills | Status: AC | PRN
Start: 2022-03-27 — End: 2022-04-26

## 2022-03-27 NOTE — Telephone Encounter (Signed)
Per Gregery Na, NP- RN contacted Dr. Arelia Sneddon (pcp) to determine if pcp would be willing to prescribe pt's Flexeril and xanax.

## 2022-03-27 NOTE — Telephone Encounter (Signed)
I called and spoke with patient/wife by phone.  Overall, he is doing well.  He had his port removed yesterday and has had some residual soreness.  Patient does continue to take as needed alprazolam and Flexeril.  We will proceed with refilling both of those today but I requested all future refills come from his PCP.  Patient/wife verbalized agreement.  PDMP reviewed.

## 2022-03-28 ENCOUNTER — Telehealth: Payer: Self-pay | Admitting: *Deleted

## 2022-03-28 NOTE — Telephone Encounter (Signed)
Returned phone call to Rich Number  from [employer] [phone]  Discussed that patient has not had an apt with pcp since 2021 and in order for pcp to manage pt's routine meds, patient needs an apt with pcp. Pt had previously contacted pcp in may 2023 and requested an apt then but didn't f/u.  Once pcp is willilng to prescribe routine meds, but pt needs an apt with pcp. Pcp would like to discuss alternative meds to alprazolam and would not want to keep pt on this long term.   I will msg pt to contact pcp for an apt for future refills.

## 2022-08-07 ENCOUNTER — Ambulatory Visit: Admission: RE | Admit: 2022-08-07 | Payer: Self-pay | Source: Ambulatory Visit

## 2022-08-07 ENCOUNTER — Inpatient Hospital Stay: Payer: Self-pay | Attending: Oncology

## 2022-08-07 DIAGNOSIS — Z801 Family history of malignant neoplasm of trachea, bronchus and lung: Secondary | ICD-10-CM | POA: Insufficient documentation

## 2022-08-07 DIAGNOSIS — C20 Malignant neoplasm of rectum: Secondary | ICD-10-CM

## 2022-08-07 DIAGNOSIS — Z800 Family history of malignant neoplasm of digestive organs: Secondary | ICD-10-CM | POA: Insufficient documentation

## 2022-08-07 DIAGNOSIS — Z8042 Family history of malignant neoplasm of prostate: Secondary | ICD-10-CM | POA: Insufficient documentation

## 2022-08-07 DIAGNOSIS — Z85048 Personal history of other malignant neoplasm of rectum, rectosigmoid junction, and anus: Secondary | ICD-10-CM | POA: Insufficient documentation

## 2022-08-07 DIAGNOSIS — F419 Anxiety disorder, unspecified: Secondary | ICD-10-CM | POA: Insufficient documentation

## 2022-08-07 DIAGNOSIS — Z8051 Family history of malignant neoplasm of kidney: Secondary | ICD-10-CM | POA: Insufficient documentation

## 2022-08-07 DIAGNOSIS — Z803 Family history of malignant neoplasm of breast: Secondary | ICD-10-CM | POA: Insufficient documentation

## 2022-08-07 DIAGNOSIS — F17210 Nicotine dependence, cigarettes, uncomplicated: Secondary | ICD-10-CM | POA: Insufficient documentation

## 2022-08-07 LAB — CBC WITH DIFFERENTIAL/PLATELET
Abs Immature Granulocytes: 0.04 10*3/uL (ref 0.00–0.07)
Basophils Absolute: 0.1 10*3/uL (ref 0.0–0.1)
Basophils Relative: 1 %
Eosinophils Absolute: 0.4 10*3/uL (ref 0.0–0.5)
Eosinophils Relative: 4 %
HCT: 44.2 % (ref 39.0–52.0)
Hemoglobin: 15.1 g/dL (ref 13.0–17.0)
Immature Granulocytes: 0 %
Lymphocytes Relative: 23 %
Lymphs Abs: 2.1 10*3/uL (ref 0.7–4.0)
MCH: 32.5 pg (ref 26.0–34.0)
MCHC: 34.2 g/dL (ref 30.0–36.0)
MCV: 95.3 fL (ref 80.0–100.0)
Monocytes Absolute: 0.6 10*3/uL (ref 0.1–1.0)
Monocytes Relative: 6 %
Neutro Abs: 5.9 10*3/uL (ref 1.7–7.7)
Neutrophils Relative %: 66 %
Platelets: 253 10*3/uL (ref 150–400)
RBC: 4.64 MIL/uL (ref 4.22–5.81)
RDW: 13.2 % (ref 11.5–15.5)
WBC: 9 10*3/uL (ref 4.0–10.5)
nRBC: 0 % (ref 0.0–0.2)

## 2022-08-07 LAB — COMPREHENSIVE METABOLIC PANEL
ALT: 22 U/L (ref 0–44)
AST: 24 U/L (ref 15–41)
Albumin: 4.4 g/dL (ref 3.5–5.0)
Alkaline Phosphatase: 91 U/L (ref 38–126)
Anion gap: 8 (ref 5–15)
BUN: 13 mg/dL (ref 6–20)
CO2: 24 mmol/L (ref 22–32)
Calcium: 9.5 mg/dL (ref 8.9–10.3)
Chloride: 104 mmol/L (ref 98–111)
Creatinine, Ser: 1.12 mg/dL (ref 0.61–1.24)
GFR, Estimated: >60 mL/min (ref >60)
Glucose, Bld: 115 mg/dL — ABNORMAL HIGH (ref 70–99)
Potassium: 4 mmol/L (ref 3.5–5.1)
Sodium: 136 mmol/L (ref 135–145)
Total Bilirubin: 0.5 mg/dL (ref 0.3–1.2)
Total Protein: 7.8 g/dL (ref 6.5–8.1)

## 2022-08-09 LAB — CEA: CEA: 4.2 ng/mL (ref 0.0–4.7)

## 2022-08-13 ENCOUNTER — Ambulatory Visit: Payer: Self-pay | Admitting: Oncology

## 2022-08-14 ENCOUNTER — Ambulatory Visit
Admission: RE | Admit: 2022-08-14 | Discharge: 2022-08-14 | Disposition: A | Discharge status: Home / Self Care | Payer: Self-pay | Source: Ambulatory Visit | Attending: Oncology | Admitting: Oncology

## 2022-08-14 DIAGNOSIS — C20 Malignant neoplasm of rectum: Secondary | ICD-10-CM | POA: Insufficient documentation

## 2022-08-14 MED ORDER — IOHEXOL 300 MG/ML  SOLN
100.0000 mL | Freq: Once | INTRAMUSCULAR | Status: AC | PRN
  Administered 2022-08-14: 100 mL via INTRAVENOUS

## 2022-08-19 ENCOUNTER — Encounter: Payer: Self-pay | Admitting: Oncology

## 2022-08-19 ENCOUNTER — Inpatient Hospital Stay (HOSPITAL_BASED_OUTPATIENT_CLINIC_OR_DEPARTMENT_OTHER): Payer: Self-pay | Admitting: Oncology

## 2022-08-19 VITALS — BP 144/99 | HR 80 | Temp 97.3°F | Resp 16 | Ht 74.0 in | Wt 232.0 lb

## 2022-08-19 DIAGNOSIS — C20 Malignant neoplasm of rectum: Secondary | ICD-10-CM

## 2022-08-19 NOTE — Progress Notes (Signed)
[place]  [phone] [phone]) [phone]  ID: Malik Lynch OB: 07-21-1970  MR#: [id number]  [id number]  Patient Care Team: Leonard Downing, MD as PCP - General (Family Medicine) [name], RN as Oncology Nurse Navigator Grayland Ormond, Kathlene November, MD as Consulting Physician (Oncology)  CHIEF COMPLAINT: Pathologic stage IIa rectal cancer, stage I clear cell carcinoma kidney.  INTERVAL HISTORY: Patient returns to clinic today for routine 58-monthevaluation and discussion of his imaging results.  He continues to feel well and remains asymptomatic.  He is having no issues with his colostomy bag.  He denies any pain. He has no neurologic complaints. He denies any recent fevers or illnesses. He has no chest pain, shortness of breath, cough, or hemoptysis.  He denies any nausea, vomiting, constipation, or diarrhea.  He has no melena or hematochezia.  He has no urinary complaints.  Patient offers no specific complaints today.  REVIEW OF SYSTEMS:   Review of Systems  Constitutional: Negative.  Negative for fever, malaise/fatigue and weight loss.  Respiratory: Negative.  Negative for cough, hemoptysis and shortness of breath.   Cardiovascular: Negative.  Negative for chest pain and leg swelling.  Gastrointestinal:  Negative for abdominal pain, blood in stool, diarrhea and melena.  Genitourinary: Negative.  Negative for urgency.  Musculoskeletal: Negative.  Negative for back pain.  Skin: Negative.  Negative for rash.  Neurological: Negative.  Negative for dizziness, focal weakness, weakness and headaches.  Psychiatric/Behavioral:  The patient is not nervous/anxious and does not have insomnia.     As per HPI. Otherwise, a complete review of systems is negative.  PAST MEDICAL HISTORY: Past Medical History:  Diagnosis Date   Family history of breast cancer    Family history of colon cancer    Family history of kidney cancer    Family history  of prostate cancer    Family history of uterine cancer     PAST SURGICAL HISTORY: Past Surgical History:  Procedure Laterality Date   IR IMAGING GUIDED PORT INSERTION  09/29/2020   IR REMOVAL TUN ACCESS W/ PORT W/O FL MOD SED  03/26/2022    FAMILY HISTORY: Family History  Problem Relation Age of Onset   Throat cancer Mother 722  Lymphoma Maternal Aunt    Kidney cancer Maternal Uncle    Breast cancer Paternal Aunt    Colon cancer Paternal Uncle    Lung cancer Maternal Grandmother    Cancer Maternal Grandmother        mouth   Prostate cancer Maternal Grandfather        dx 733s  Colon cancer Maternal Grandfather        dx 80s   Lung cancer Maternal Uncle    Cancer Maternal Uncle        possibly kidney   Uterine cancer Paternal Aunt    Cancer Paternal Aunt        reproductive   Kidney cancer Paternal Uncle    Breast cancer Cousin    Breast cancer Cousin    Colon cancer Cousin        dx 425s   ADVANCED DIRECTIVES (Y/N):  N  HEALTH MAINTENANCE: Social History   Tobacco Use   Smoking status: Some Days    Packs/day: 1.00    Types: Cigarettes   Smokeless tobacco: Never  Vaping Use   Vaping Use: Never used  Substance Use Topics   Alcohol use: Yes    Alcohol/week: 1.0 standard drink of alcohol  Types: 1 Cans of beer per week   Drug use: No     Colonoscopy:  PAP:  Bone density:  Lipid panel:  No Known Allergies  Current Outpatient Medications  Medication Sig Dispense Refill   polyethylene glycol (MIRALAX / GLYCOLAX) 17 g packet Take 17 g by mouth daily.     Sennosides-Docusate Sodium 8.6-50 MG CAPS Take 1 tablet by mouth as needed for constipation.     cyclobenzaprine (FLEXERIL) 10 MG tablet Take 1 tablet (10 mg total) by mouth daily. (Patient not taking: Reported on 08/19/2022) 60 tablet 0   ibuprofen (ADVIL,MOTRIN) 100 MG tablet Take 800 mg by mouth every 6 (six) hours as needed. (Patient not taking: Reported on 08/19/2022)     ketoconazole (NIZORAL) 2 %  shampoo Apply 1 application topically as directed. 3 times per week wash from scalp down for 6 weeks then once monthly for prevention. Leave on for a few minutes then rinse. (Patient not taking: Reported on 08/19/2022) 120 mL 6   lactulose (CHRONULAC) 10 GM/15ML solution Take 15-30 mLs (10-20 g total) by mouth 2 (two) times daily as needed for severe constipation. (Patient not taking: Reported on 08/19/2022) 236 mL 0   lidocaine-prilocaine (EMLA) cream Apply to affected area once (Patient not taking: Reported on 02/14/2022) 30 g 3   loperamide (IMODIUM A-D) 2 MG tablet Take 2 mg by mouth 4 (four) times daily as needed for diarrhea or loose stools. (Patient not taking: Reported on 08/19/2022)     ondansetron (ZOFRAN) 8 MG tablet Take 1 tablet (8 mg total) by mouth every 8 (eight) hours as needed for nausea or vomiting. (Patient not taking: Reported on 08/19/2022) 45 tablet 0   oxyCODONE (OXYCONTIN) 10 mg 12 hr tablet Take 1 tablet (10 mg total) by mouth every 12 (twelve) hours. (Patient not taking: Reported on 08/19/2022) 30 tablet 0   oxyCODONE-acetaminophen (PERCOCET/ROXICET) 5-325 MG tablet TAKE 1-2 TABLETS BY MOUTH EVERY 4 HOURS AS NEEDED FOR SEVERE PAIN. (Patient not taking: Reported on 02/14/2022) 90 tablet 0   prochlorperazine (COMPAZINE) 10 MG tablet TAKE ONE TABLET BY MOUTH EVERY 6 HOURS AS NEEDED FOR NAUSEA / VOMITING (Patient not taking: Reported on 08/19/2022) 60 tablet 1   senna (SENOKOT) 8.6 MG TABS tablet Take 1 tablet (8.6 mg total) by mouth daily. (Patient not taking: Reported on 08/19/2022) 120 tablet 3   tamsulosin (FLOMAX) 0.4 MG CAPS capsule Take 1 capsule (0.4 mg total) by mouth daily. (Patient not taking: Reported on 05/08/2021) 90 capsule 2   No current facility-administered medications for this visit.    OBJECTIVE: Vitals:   08/19/22 0906  BP: (!) 144/99  Pulse: 80  Resp: 16  Temp: (!) 97.3 F (36.3 C)  SpO2: 100%     Body mass index is 29.79 kg/m.    ECOG FS:0 -  Asymptomatic  General: Well-developed, well-nourished, no acute distress. Eyes: Pink conjunctiva, anicteric sclera. HEENT: Normocephalic, moist mucous membranes. Lungs: No audible wheezing or coughing. Heart: Regular rate and rhythm. Abdomen: Soft, nontender, no obvious distention.  Colostomy bag noted. Musculoskeletal: No edema, cyanosis, or clubbing. Neuro: Alert, answering all questions appropriately. Cranial nerves grossly intact. Skin: No rashes or petechiae noted. Psych: Normal affect.  LAB RESULTS:  Lab Results  Component Value Date   NA 136 08/07/2022   K 4.0 08/07/2022   CL 104 08/07/2022   CO2 24 08/07/2022   GLUCOSE 115 (H) 08/07/2022   BUN 13 08/07/2022   CREATININE 1.12 08/07/2022   CALCIUM 9.5  08/07/2022   PROT 7.8 08/07/2022   ALBUMIN 4.4 08/07/2022   AST 24 08/07/2022   ALT 22 08/07/2022   ALKPHOS 91 08/07/2022   BILITOT 0.5 08/07/2022   GFRNONAA >60 08/07/2022    Lab Results  Component Value Date   WBC 9.0 08/07/2022   NEUTROABS 5.9 08/07/2022   HGB 15.1 08/07/2022   HCT 44.2 08/07/2022   MCV 95.3 08/07/2022   PLT 253 08/07/2022     STUDIES: CT CHEST ABDOMEN PELVIS W CONTRAST  Result Date: 08/14/2022 CLINICAL DATA:  History of rectal cancer. History of partial left nephrectomy for renal cell carcinoma. * Tracking Code: BO * EXAM: CT CHEST, ABDOMEN, AND PELVIS WITH CONTRAST TECHNIQUE: Multidetector CT imaging of the chest, abdomen and pelvis was performed following the standard protocol during bolus administration of intravenous contrast. RADIATION DOSE REDUCTION: This exam was performed according to the departmental dose-optimization program which includes automated exposure control, adjustment of the mA and/or kV according to patient size and/or use of iterative reconstruction technique. CONTRAST:  138m OMNIPAQUE IOHEXOL 300 MG/ML  SOLN COMPARISON:  02/11/2022 chest CT. Most recent abdominopelvic CT 08/22/2021 FINDINGS: CT CHEST FINDINGS  Cardiovascular: Aortic atherosclerosis. Normal heart size, without pericardial effusion. Lad coronary artery calcification on 32/2. No central pulmonary embolism, on this non-dedicated study. Mediastinum/Nodes: No supraclavicular adenopathy. No mediastinal or hilar adenopathy. Lungs/Pleura: No pleural fluid. Mild centrilobular and paraseptal emphysema. Smoking related respiratory bronchiolitis. No suspicious pulmonary nodule or mass. Musculoskeletal: No acute osseous abnormality. CT ABDOMEN PELVIS FINDINGS Hepatobiliary: Normal liver. Normal gallbladder, without biliary ductal dilatation. Pancreas: Normal, without mass or ductal dilatation. Spleen: Normal in size, without focal abnormality. Adrenals/Urinary Tract: Normal adrenal glands. Normal right kidney. Lower pole left partial nephrectomy without residual or recurrent disease. Similar area of presumably postoperative mild hypoenhancement anteriorly including on 30/7. No hydronephrosis. The bladder is underdistended and appears mildly thick walled including on 123/2. Stomach/Bowel: Normal stomach, without wall thickening. Abdominal perineal resection with end descending colostomy. No acute complication. Normal terminal ileum and appendix. Normal small bowel. Vascular/Lymphatic: Aortic atherosclerosis. Circumaortic left renal vein. No abdominopelvic adenopathy. Reproductive: Normal prostate. Other: No significant free fluid. No evidence of omental or peritoneal disease. Small fat containing paraumbilical hernia. Musculoskeletal: Degenerative disc disease at the lumbosacral junction. IMPRESSION: 1. Status post abdominal perineal resection with end colostomy. No findings of recurrent or metastatic disease. 2. Status post lower pole left partial nephrectomy without local recurrence or metastatic disease. 3. Age advanced coronary artery atherosclerosis. Recommend assessment of coronary risk factors. 4. Apparent mild bladder wall thickening could be due to  underdistention. Correlate with symptoms of cystitis. 5. Aortic atherosclerosis (ICD10-I70.0) and emphysema (ICD10-J43.9). Electronically Signed   By: KAbigail MiyamotoM.D.   On: 08/14/2022 16:24    ASSESSMENT: Pathologic stage IIa rectal cancer, stage I clear cell carcinoma kidney.   PLAN:    Pathologic stage IIa rectal cancer:  Patient completed his neoadjuvant treatment with chemotherapy and XRT on August 17, 2020.  He subsequently underwent treatment with chemotherapy only using FOLFOX and completed 6 cycles on December 12, 2020.  He underwent surgical resection on February 05, 2021.  [facility]  [facility] scan results from August 14, 2022 reviewed independently and reported as above with no obvious evidence of recurrent or progressive disease.  Patient's CEA is within normal limits at 4.2.  No intervention is needed at this time.  Will continue imaging of abdomen and pelvis every 6 months until he is 2 years  removed from his surgery and then likely transition to yearly evaluation.  Return to clinic in 6 months with repeat imaging and further evaluation.   Pain: Resolved.  Patient only takes narcotics intermittently. Stage I clear cell carcinoma: Patient is status postresection on February 05, 2021.  He does not require adjuvant treatment.  Imaging as above. Anxiety: Patient does not complain of this today.  Continue Xanax as needed. Ostomy care: Continue follow-up with ostomy clinic.   Patient expressed understanding and was in agreement with this plan. He also understands that He can call clinic at any time with any questions, concerns, or complaints.    Cancer Staging  Rectal cancer [facility]) Staging form: Colon and Rectum, AJCC 8th Edition - Clinical stage from 06/22/2020: Stage IIIB (cT3, cN1a, cM0) - Signed by Lloyd Huger, MD on 06/22/2020 Stage prefix: Initial diagnosis Total positive nodes: 1   Lloyd Huger, MD   08/19/2022 11:03 AM

## 2022-09-03 ENCOUNTER — Other Ambulatory Visit: Payer: Self-pay

## 2022-11-06 IMAGING — CT CT ABD-PELV W/ CM
2 of 5 series · 12 of 46 positions shown, 14 images · IV contrast (iopamidol)
Comparison: None.

CLINICAL DATA: 49-year-old male with left lower quadrant abdominal
pain. Difficulty urinating and voiding.

EXAM:
CT ABDOMEN AND PELVIS WITH CONTRAST
TECHNIQUE: Multidetector CT imaging of the abdomen and pelvis was performed
using the standard protocol following bolus administration of
intravenous contrast.
CONTRAST:  100mL 5GYONV-044 IOPAMIDOL (5GYONV-044) INJECTION 61%

[Series 2: abd pelvis 5.00 br40 s3 axial · axial · 0.59mm/px · z∈[+1169,+1539]mm · 9 of 94 slices shown, 11 images]
[im 10/94  soft-tissue]
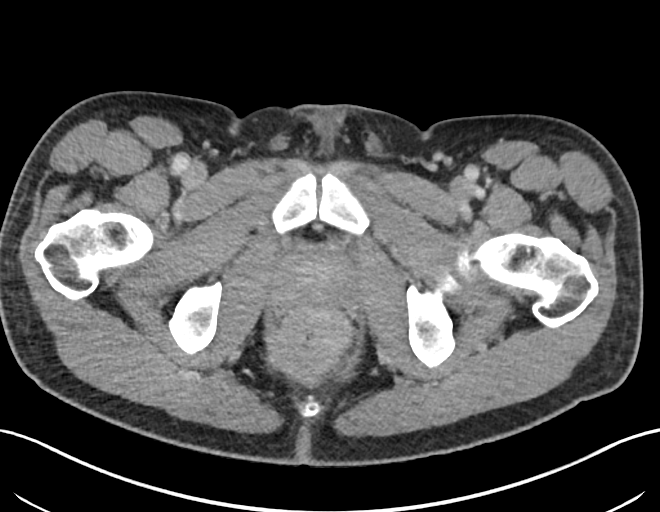
[im 10/94  bone]
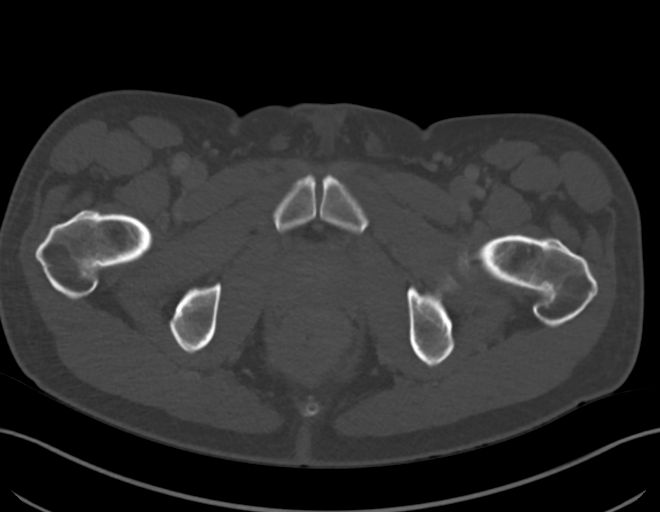
[im 19/94  soft-tissue]
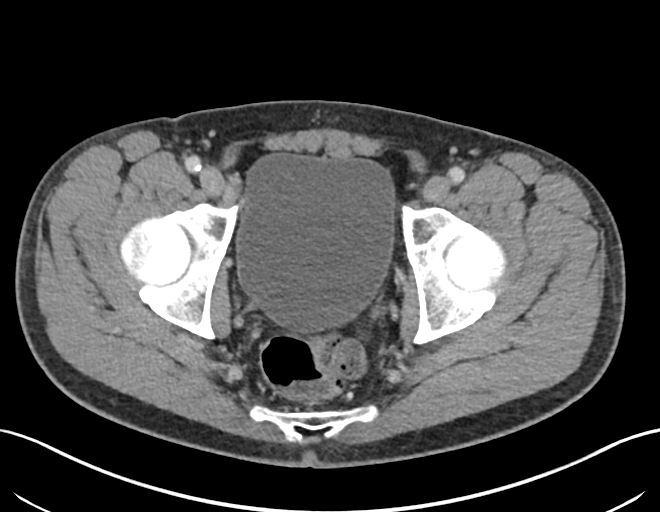
[im 28/94  soft-tissue]
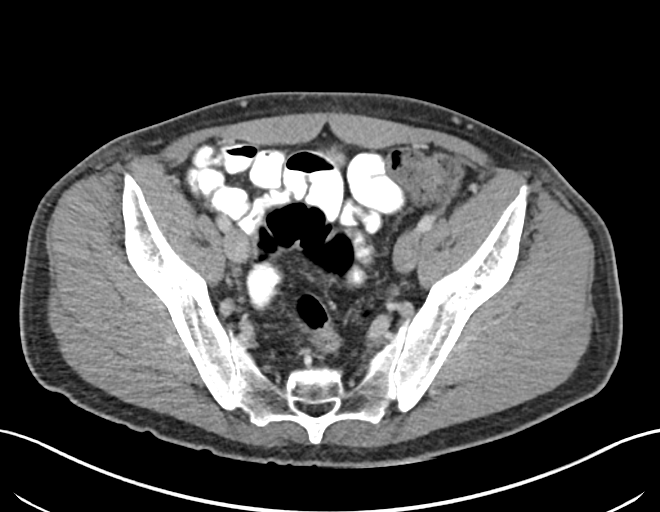
[im 38/94  soft-tissue]
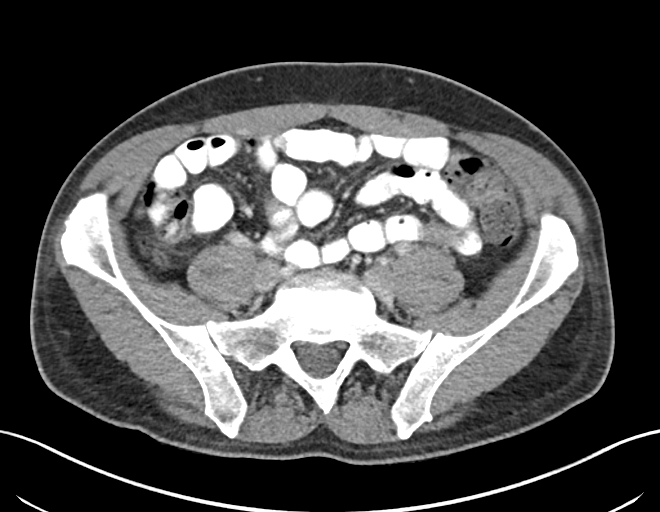
[im 47/94  soft-tissue]
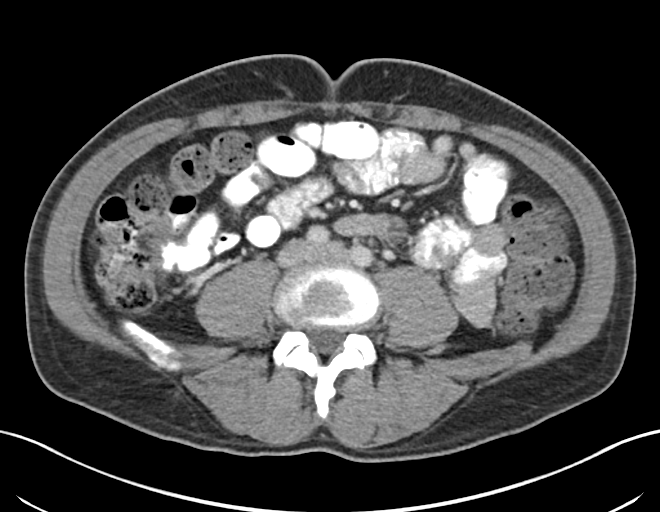
[im 56/94  soft-tissue]
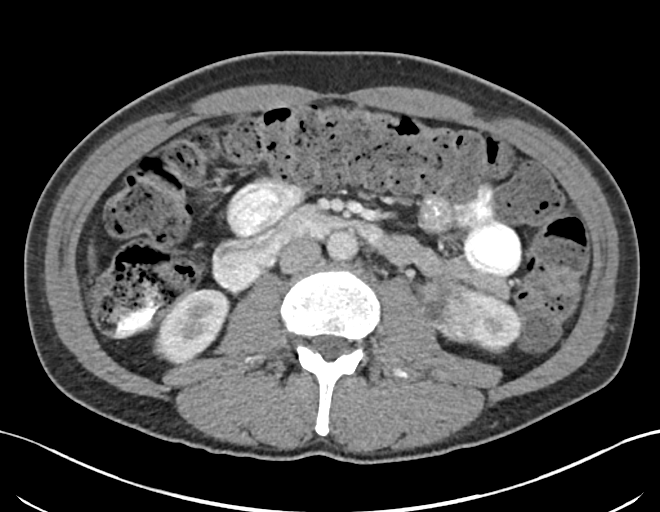
[im 66/94  soft-tissue]
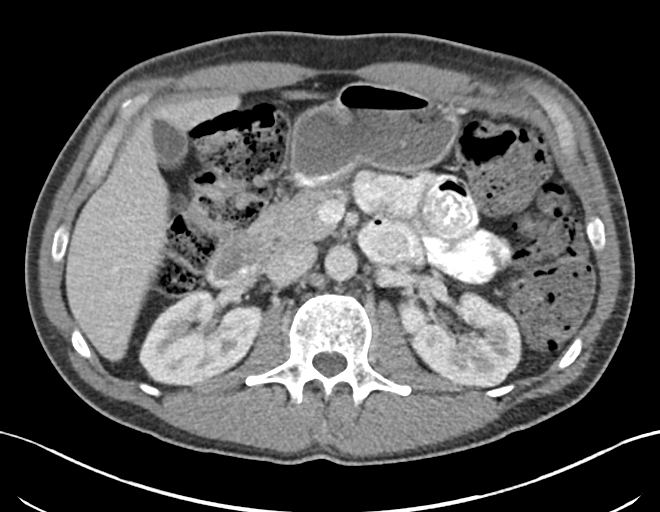
[im 75/94  soft-tissue]
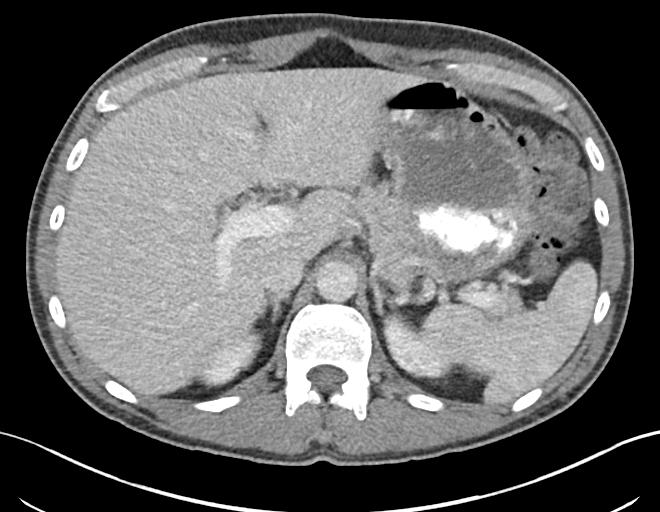
[im 84/94  soft-tissue]
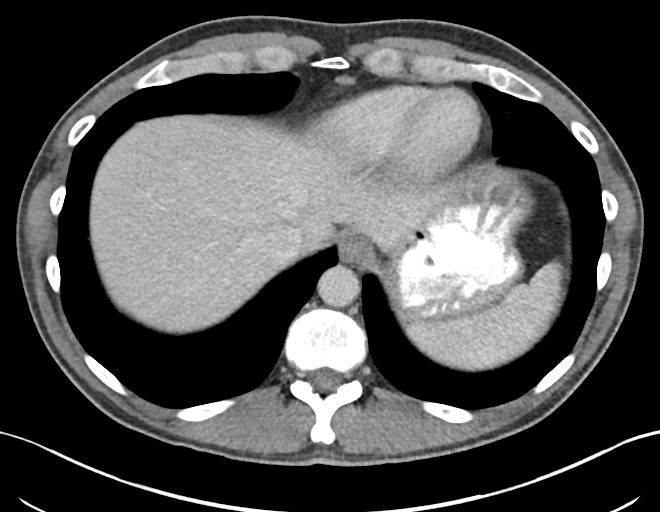
[im 84/94  bone]
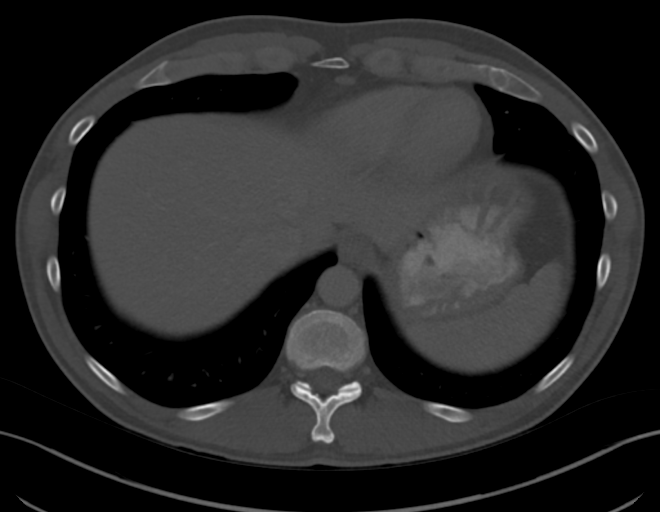

[Series 6: abd pelvis 2.00 br40 s3 cor · coronal · 0.76mm/px · 3 of 137 slices shown]
[im 46/137  soft-tissue]
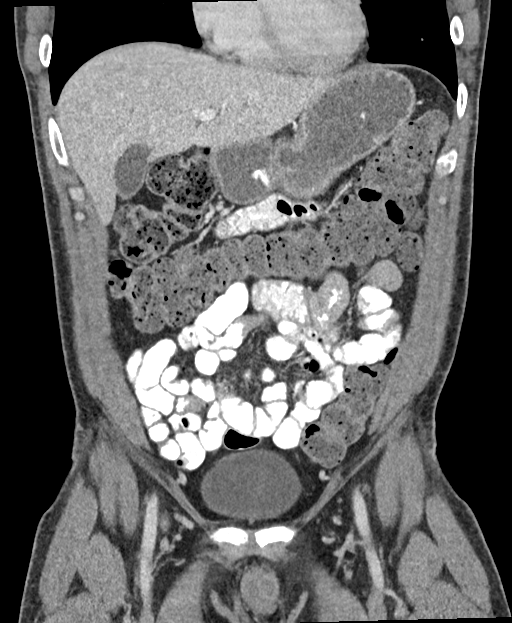
[im 61/137  soft-tissue]
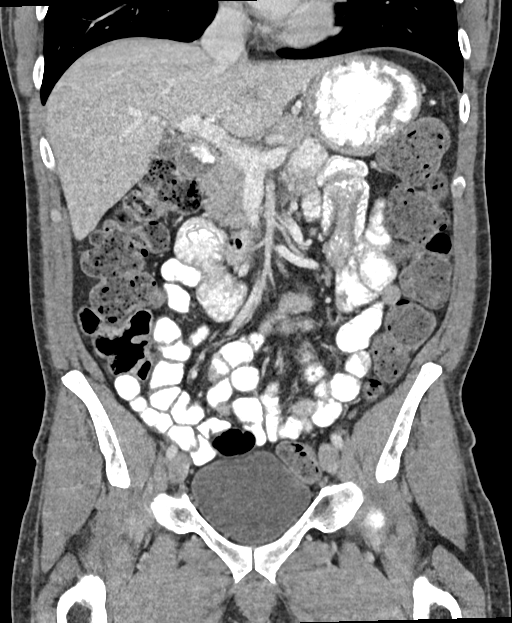
[im 76/137  soft-tissue]
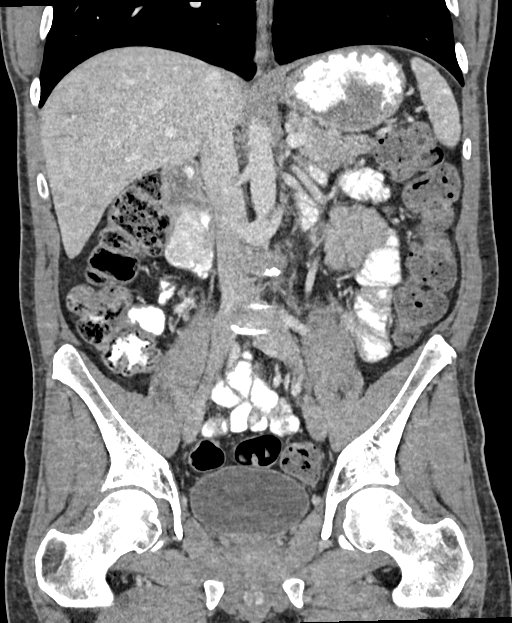

[12 of 46 positions shown; findings below may reference images not displayed]

FINDINGS: Lower chest: The visualized lung bases are clear.

No intra-abdominal free air or free fluid.

Hepatobiliary: No focal liver abnormality is seen. No gallstones,
gallbladder wall thickening, or biliary dilatation.

Pancreas: Unremarkable. No pancreatic ductal dilatation or
surrounding inflammatory changes.

Spleen: Normal in size without focal abnormality.

Adrenals/Urinary Tract: The adrenal glands unremarkable there is a
2.8 x 3.6 x 3.4 cm heterogeneously enhancing solid mass with early
washout on delayed images in the medial lower pole of the left
kidney most consistent with neoplasm. Further characterization with
renal mass protocol MRI recommended. The mass appears to abut the
left psoas muscle. There is no hydronephrosis on either side. There
is symmetric enhancement and excretion of contrast by both kidneys.
The visualized ureters and urinary bladder appear unremarkable.

Stomach/Bowel: There is thickened and irregular appearance of the
rectal wall. This is suboptimally evaluated on this CT. A rectal
mass is not excluded. Correlation with rectal exam and direct
visualization is recommended. There is moderate amount of stool
throughout the colon. There is no bowel obstruction or active
inflammation. There is a 4.5 cm jejunal-jejunal intussusception in
the left upper abdomen without associated obstruction, likely
transient. Normal appendix.

Vascular/Lymphatic: The abdominal aorta and IVC unremarkable. There
is retrograde left renal vein anatomy. No portal venous gas. There
is no adenopathy.

Reproductive: The prostate and seminal vesicles are grossly
unremarkable. No pelvic mass

Other: Small fat containing umbilical hernia.

Musculoskeletal: Degenerative changes primarily at L5-S1. No acute
osseous pathology.
IMPRESSION: 1. A 2.8 x 3.6 x 3.4 cm left renal lower pole solid mass most
consistent with neoplasm. Further characterization with renal mass
protocol MRI recommended.
2. Thickened and irregular appearance of the rectal wall. A rectal
mass is not excluded. Correlation with rectal exam and direct
visualization is recommended.
3. No bowel obstruction.  Normal appendix.

## 2023-01-07 ENCOUNTER — Other Ambulatory Visit: Payer: Self-pay

## 2023-01-07 ENCOUNTER — Emergency Department
Admission: EM | Admit: 2023-01-07 | Discharge: 2023-01-07 | Disposition: A | Discharge status: Home / Self Care | Payer: Medicare Other | Attending: Emergency Medicine | Admitting: Emergency Medicine

## 2023-01-07 DIAGNOSIS — K0889 Other specified disorders of teeth and supporting structures: Secondary | ICD-10-CM | POA: Diagnosis present

## 2023-01-07 DIAGNOSIS — K047 Periapical abscess without sinus: Secondary | ICD-10-CM | POA: Insufficient documentation

## 2023-01-07 DIAGNOSIS — Z85048 Personal history of other malignant neoplasm of rectum, rectosigmoid junction, and anus: Secondary | ICD-10-CM | POA: Insufficient documentation

## 2023-01-07 MED ORDER — AMOXICILLIN-POT CLAVULANATE 875-125 MG PO TABS: 1.0000 | ORAL_TABLET | Freq: Two times a day (BID) | ORAL | 0 refills | Status: AC

## 2023-01-07 MED ORDER — LIDOCAINE HCL (PF) 1 % IJ SOLN: 1.2000 mL | Freq: Once | INTRAMUSCULAR | Status: DC

## 2023-01-07 MED ORDER — LIDOCAINE-EPINEPHRINE 2 %-1:100000 IJ SOLN
1.7000 mL | Freq: Once | INTRAMUSCULAR | Status: AC
  Administered 2023-01-07: 1.7 mL
  Filled 2023-01-07: qty 1.7

## 2023-01-07 NOTE — ED Notes (Signed)
See triage note  Presents with possible dental abscess  States he has lost his filings a while back  Swelling noted to left side of face

## 2023-01-07 NOTE — ED Triage Notes (Signed)
Pt presents to ER with c/o left upper dental pain that started yesterday afternoon and has become worse since then.  Pt reports swelling in his upper mouth.  Pt states he has had issues with his teeth since having cancer, and has been unable to see a dentist since this pain started.  Pt endorses some n/v associated with pain, but denies fevers.  Pt is otherwise A&O x4 and in NAD.

## 2023-01-07 NOTE — ED Provider Notes (Signed)
[facility] Provider Note    Event Date/Time   First MD Initiated Contact with Patient 01/07/23 0725     (approximate)   History   Dental Pain   HPI  Malik Lynch is a 52 y.o. male  with history of panic disorder, discogenic low back pain, and rectal cancer and as listed in EMR presents to the emergency department for dental pain for 2 days with facial swelling this morning. No relief of pain with orajel or ibuprofen. Unable to see dentist due to current financial situation.      Physical Exam   Triage Vital Signs: ED Triage Vitals  Enc Vitals Group     BP 01/07/23 0657 (!) 160/98     Pulse Rate 01/07/23 0657 69     Resp 01/07/23 0657 18     Temp 01/07/23 0657 98.1 F (36.7 C)     Temp src --      SpO2 01/07/23 0657 100 %     Weight 01/07/23 0701 215 lb (97.5 kg)     Height 01/07/23 0701 6\' 2"  (1.88 m)     Head Circumference --      Peak Flow --      Pain Score 01/07/23 0700 10     Pain Loc --      Pain Edu? --      Excl. in GC? --     Most recent vital signs: Vitals:   01/07/23 0657  BP: (!) 160/98  Pulse: 69  Resp: 18  Temp: 98.1 F (36.7 C)  SpO2: 100%    General: Awake, no distress.  CV:  Good peripheral perfusion.  Resp:  Normal effort.  Abd:  No distention.  Other:  Receding gums, filling missing from #10 with associated facial swelling.   ED Results / Procedures / Treatments   Labs (all labs ordered are listed, but only abnormal results are displayed) Labs Reviewed - No data to display   EKG  Not indicated.   RADIOLOGY  Image and radiology report reviewed and interpreted by me. Radiology report consistent with the same.  Not indicated.  PROCEDURES:  Critical Care performed: No  .Nerve Block  Date/Time: 01/07/2023 8:11 AM  Performed by: Chinita Pester, FNP Authorized by: Chinita Pester, FNP   Consent:    Consent obtained:  Verbal   Consent given by:  Patient   Risks discussed:   Pain Universal protocol:    Patient identity confirmed:  Verbally with patient Indications:    Indications:  Pain relief Location:    Nerve block body site: dental. Skin anesthesia:    Skin anesthesia method:  None Procedure details:    Block needle gauge:  27 G   Anesthetic injected:  Lidocaine 2% WITH epi   Injection procedure:  Anatomic landmarks identified   Paresthesia:  Immediately resolved Post-procedure details:    Dressing:  None   Outcome:  Pain relieved   Procedure completion:  Tolerated well, no immediate complications    MEDICATIONS ORDERED IN ED:  Medications  lidocaine-EPINEPHrine (XYLOCAINE W/EPI) 2 %-1:100000 (with pres) injection 1.7 mL (1.7 mLs Other Given by Other 01/07/23 0807)     IMPRESSION / MDM / ASSESSMENT AND PLAN / ED COURSE   I have reviewed the triage note.  Differential diagnosis includes, but is not limited to, dental pain, dental abscess  Patient's presentation is most consistent with acute, uncomplicated illness.  52 year old male presents to the ER for dental pain  with facial swelling. See HPI.  Dental block performed per patient request.   Patient discharged home with community resource information to follow up with the dentist.      FINAL CLINICAL IMPRESSION(S) / ED DIAGNOSES   Final diagnoses:  Dental abscess     Rx / DC Orders   ED Discharge Orders          Ordered    amoxicillin-clavulanate (AUGMENTIN) 875-125 MG tablet  2 times daily        01/07/23 0815             Note:  This document was prepared using Dragon voice recognition software and may include unintentional dictation errors.   Chinita Pester, FNP 01/07/23 4098    Chesley Noon, MD 01/07/23 [phone]

## 2023-01-07 NOTE — Discharge Instructions (Signed)
Please call and schedule a dental appointment as soon as possible. You will need to be seen within the next 14 days. Return to the emergency department for symptoms that change or worsen if you're unable to schedule an appointment. ° °

## 2023-02-17 ENCOUNTER — Ambulatory Visit: Admission: RE | Admit: 2023-02-17 | Payer: Medicare Other | Source: Ambulatory Visit

## 2023-02-17 ENCOUNTER — Inpatient Hospital Stay: Payer: Medicare Other | Attending: Oncology

## 2023-02-17 DIAGNOSIS — Z801 Family history of malignant neoplasm of trachea, bronchus and lung: Secondary | ICD-10-CM | POA: Insufficient documentation

## 2023-02-17 DIAGNOSIS — Z720 Tobacco use: Secondary | ICD-10-CM | POA: Insufficient documentation

## 2023-02-17 DIAGNOSIS — Z8042 Family history of malignant neoplasm of prostate: Secondary | ICD-10-CM | POA: Diagnosis not present

## 2023-02-17 DIAGNOSIS — F1090 Alcohol use, unspecified, uncomplicated: Secondary | ICD-10-CM | POA: Diagnosis not present

## 2023-02-17 DIAGNOSIS — Z800 Family history of malignant neoplasm of digestive organs: Secondary | ICD-10-CM | POA: Insufficient documentation

## 2023-02-17 DIAGNOSIS — Z808 Family history of malignant neoplasm of other organs or systems: Secondary | ICD-10-CM | POA: Diagnosis not present

## 2023-02-17 DIAGNOSIS — Z85528 Personal history of other malignant neoplasm of kidney: Secondary | ICD-10-CM | POA: Diagnosis not present

## 2023-02-17 DIAGNOSIS — Z803 Family history of malignant neoplasm of breast: Secondary | ICD-10-CM | POA: Insufficient documentation

## 2023-02-17 DIAGNOSIS — F419 Anxiety disorder, unspecified: Secondary | ICD-10-CM | POA: Insufficient documentation

## 2023-02-17 DIAGNOSIS — Z85048 Personal history of other malignant neoplasm of rectum, rectosigmoid junction, and anus: Secondary | ICD-10-CM | POA: Diagnosis present

## 2023-02-17 DIAGNOSIS — Z8051 Family history of malignant neoplasm of kidney: Secondary | ICD-10-CM | POA: Diagnosis not present

## 2023-02-17 DIAGNOSIS — C20 Malignant neoplasm of rectum: Secondary | ICD-10-CM

## 2023-02-17 LAB — CBC WITH DIFFERENTIAL/PLATELET
Abs Immature Granulocytes: 0.02 10*3/uL (ref 0.00–0.07)
Basophils Absolute: 0.1 10*3/uL (ref 0.0–0.1)
Basophils Relative: 1 %
Eosinophils Absolute: 0.3 10*3/uL (ref 0.0–0.5)
Eosinophils Relative: 4 %
HCT: 39.2 % (ref 39.0–52.0)
Hemoglobin: 13.3 g/dL (ref 13.0–17.0)
Immature Granulocytes: 0 %
Lymphocytes Relative: 25 %
Lymphs Abs: 1.9 10*3/uL (ref 0.7–4.0)
MCH: 32.1 pg (ref 26.0–34.0)
MCHC: 33.9 g/dL (ref 30.0–36.0)
MCV: 94.7 fL (ref 80.0–100.0)
Monocytes Absolute: 0.5 10*3/uL (ref 0.1–1.0)
Monocytes Relative: 7 %
Neutro Abs: 4.8 10*3/uL (ref 1.7–7.7)
Neutrophils Relative %: 63 %
Platelets: 236 10*3/uL (ref 150–400)
RBC: 4.14 MIL/uL — ABNORMAL LOW (ref 4.22–5.81)
RDW: 13.6 % (ref 11.5–15.5)
WBC: 7.5 10*3/uL (ref 4.0–10.5)
nRBC: 0 % (ref 0.0–0.2)

## 2023-02-17 LAB — CMP (CANCER CENTER ONLY)
ALT: 15 U/L (ref 0–44)
AST: 16 U/L (ref 15–41)
Albumin: 4 g/dL (ref 3.5–5.0)
Alkaline Phosphatase: 76 U/L (ref 38–126)
Anion gap: 7 (ref 5–15)
BUN: 13 mg/dL (ref 6–20)
CO2: 22 mmol/L (ref 22–32)
Calcium: 8.8 mg/dL — ABNORMAL LOW (ref 8.9–10.3)
Chloride: 103 mmol/L (ref 98–111)
Creatinine: 1.08 mg/dL (ref 0.61–1.24)
GFR, Estimated: >60 mL/min (ref >60)
Glucose, Bld: 101 mg/dL — ABNORMAL HIGH (ref 70–99)
Potassium: 3.9 mmol/L (ref 3.5–5.1)
Sodium: 132 mmol/L — ABNORMAL LOW (ref 135–145)
Total Bilirubin: 0.3 mg/dL (ref 0.3–1.2)
Total Protein: 7.1 g/dL (ref 6.5–8.1)

## 2023-02-18 LAB — CEA: CEA: 3.9 ng/mL (ref 0.0–4.7)

## 2023-02-19 ENCOUNTER — Ambulatory Visit
Admission: RE | Admit: 2023-02-19 | Discharge: 2023-02-19 | Disposition: A | Discharge status: Home / Self Care | Payer: Medicare Other | Source: Ambulatory Visit | Attending: Oncology | Admitting: Oncology

## 2023-02-19 DIAGNOSIS — C20 Malignant neoplasm of rectum: Secondary | ICD-10-CM | POA: Insufficient documentation

## 2023-02-19 MED ORDER — BARIUM SULFATE 2 % PO SUSP
450.0000 mL | ORAL | Status: AC
  Administered 2023-02-19 (×2): 1 mL via ORAL

## 2023-02-19 MED ORDER — IOHEXOL 300 MG/ML  SOLN
100.0000 mL | Freq: Once | INTRAMUSCULAR | Status: AC | PRN
  Administered 2023-02-19: 100 mL via INTRAVENOUS

## 2023-02-24 ENCOUNTER — Encounter: Payer: Self-pay | Admitting: Oncology

## 2023-02-24 ENCOUNTER — Inpatient Hospital Stay (HOSPITAL_BASED_OUTPATIENT_CLINIC_OR_DEPARTMENT_OTHER): Payer: Medicare Other | Admitting: Oncology

## 2023-02-24 VITALS — BP 140/96 | HR 67 | Temp 96.5°F | Resp 18 | Wt 222.4 lb

## 2023-02-24 DIAGNOSIS — C20 Malignant neoplasm of rectum: Secondary | ICD-10-CM | POA: Diagnosis not present

## 2023-02-24 DIAGNOSIS — Z85048 Personal history of other malignant neoplasm of rectum, rectosigmoid junction, and anus: Secondary | ICD-10-CM | POA: Diagnosis not present

## 2023-02-24 NOTE — Progress Notes (Signed)
[facility]  Telephone:([phone]) [phone] Fax:(336) [phone]  ID: Malik Lynch OB: [birth year]  MR#: [id number]  KGM#:[id number]  Patient Care Team: Kaleen Mask, MD as PCP - General (Family Medicine) Benita Gutter, RN as Oncology Nurse Navigator Orlie Dakin, Tollie Pizza, MD as Consulting Physician (Oncology)  CHIEF COMPLAINT: Pathologic stage IIa rectal cancer, stage I clear cell carcinoma kidney.  INTERVAL HISTORY: Patient returns to clinic today for routine 78-month evaluation, laboratory work, and discussion of his imaging results.  He has intermittent back pain, but otherwise feels well. He is having no issues with his colostomy bag.  He denies any other pain. He has no neurologic complaints. He denies any recent fevers or illnesses. He has no chest pain, shortness of breath, cough, or hemoptysis.  He denies any nausea, vomiting, constipation, or diarrhea.  He has no melena or hematochezia.  He has no urinary complaints.  Patient offers no further specific complaints today.  REVIEW OF SYSTEMS:   Review of Systems  Constitutional: Negative.  Negative for fever, malaise/fatigue and weight loss.  Respiratory: Negative.  Negative for cough, hemoptysis and shortness of breath.   Cardiovascular: Negative.  Negative for chest pain and leg swelling.  Gastrointestinal:  Negative for abdominal pain, blood in stool, diarrhea and melena.  Genitourinary: Negative.  Negative for urgency.  Musculoskeletal:  Positive for back pain.  Skin: Negative.  Negative for rash.  Neurological: Negative.  Negative for dizziness, focal weakness, weakness and headaches.  Psychiatric/Behavioral:  The patient is not nervous/anxious and does not have insomnia.     As per HPI. Otherwise, a complete review of systems is negative.  PAST MEDICAL HISTORY: Past Medical History:  Diagnosis Date   Family history of breast cancer    Family history of colon cancer    Family history of kidney  cancer    Family history of prostate cancer    Family history of uterine cancer     PAST SURGICAL HISTORY: Past Surgical History:  Procedure Laterality Date   IR IMAGING GUIDED PORT INSERTION  09/29/2020   IR REMOVAL TUN ACCESS W/ PORT W/O FL MOD SED  03/26/2022    FAMILY HISTORY: Family History  Problem Relation Age of Onset   Throat cancer Mother 81   Lymphoma Maternal Aunt    Kidney cancer Maternal Uncle    Breast cancer Paternal Aunt    Colon cancer Paternal Uncle    Lung cancer Maternal Grandmother    Cancer Maternal Grandmother        mouth   Prostate cancer Maternal Grandfather        dx 67s   Colon cancer Maternal Grandfather        dx 80s   Lung cancer Maternal Uncle    Cancer Maternal Uncle        possibly kidney   Uterine cancer Paternal Aunt    Cancer Paternal Aunt        reproductive   Kidney cancer Paternal Uncle    Breast cancer Cousin    Breast cancer Cousin    Colon cancer Cousin        dx 58s    ADVANCED DIRECTIVES (Y/N):  N  HEALTH MAINTENANCE: Social History   Tobacco Use   Smoking status: Some Days    Current packs/day: 1.00    Types: Cigarettes   Smokeless tobacco: Never  Vaping Use   Vaping status: Never Used  Substance Use Topics   Alcohol use: Yes    Alcohol/week: 1.0  standard drink of alcohol    Types: 1 Cans of beer per week   Drug use: No     Colonoscopy:  PAP:  Bone density:  Lipid panel:  No Known Allergies  Current Outpatient Medications  Medication Sig Dispense Refill   polyethylene glycol (MIRALAX / GLYCOLAX) 17 g packet Take 17 g by mouth daily.     cyclobenzaprine (FLEXERIL) 10 MG tablet Take 1 tablet (10 mg total) by mouth daily. (Patient not taking: Reported on 08/19/2022) 60 tablet 0   ibuprofen (ADVIL,MOTRIN) 100 MG tablet Take 800 mg by mouth every 6 (six) hours as needed. (Patient not taking: Reported on 08/19/2022)     ketoconazole (NIZORAL) 2 % shampoo Apply 1 application topically as directed. 3 times per  week wash from scalp down for 6 weeks then once monthly for prevention. Leave on for a few minutes then rinse. (Patient not taking: Reported on 08/19/2022) 120 mL 6   lactulose (CHRONULAC) 10 GM/15ML solution Take 15-30 mLs (10-20 g total) by mouth 2 (two) times daily as needed for severe constipation. (Patient not taking: Reported on 08/19/2022) 236 mL 0   lidocaine-prilocaine (EMLA) cream Apply to affected area once (Patient not taking: Reported on 02/14/2022) 30 g 3   loperamide (IMODIUM A-D) 2 MG tablet Take 2 mg by mouth 4 (four) times daily as needed for diarrhea or loose stools. (Patient not taking: Reported on 08/19/2022)     ondansetron (ZOFRAN) 8 MG tablet Take 1 tablet (8 mg total) by mouth every 8 (eight) hours as needed for nausea or vomiting. (Patient not taking: Reported on 08/19/2022) 45 tablet 0   oxyCODONE (OXYCONTIN) 10 mg 12 hr tablet Take 1 tablet (10 mg total) by mouth every 12 (twelve) hours. (Patient not taking: Reported on 08/19/2022) 30 tablet 0   oxyCODONE-acetaminophen (PERCOCET/ROXICET) 5-325 MG tablet TAKE 1-2 TABLETS BY MOUTH EVERY 4 HOURS AS NEEDED FOR SEVERE PAIN. (Patient not taking: Reported on 02/14/2022) 90 tablet 0   prochlorperazine (COMPAZINE) 10 MG tablet TAKE ONE TABLET BY MOUTH EVERY 6 HOURS AS NEEDED FOR NAUSEA / VOMITING (Patient not taking: Reported on 08/19/2022) 60 tablet 1   senna (SENOKOT) 8.6 MG TABS tablet Take 1 tablet (8.6 mg total) by mouth daily. (Patient not taking: Reported on 08/19/2022) 120 tablet 3   Sennosides-Docusate Sodium 8.6-50 MG CAPS Take 1 tablet by mouth as needed for constipation. (Patient not taking: Reported on 02/24/2023)     tamsulosin (FLOMAX) 0.4 MG CAPS capsule Take 1 capsule (0.4 mg total) by mouth daily. (Patient not taking: Reported on 05/08/2021) 90 capsule 2   No current facility-administered medications for this visit.    OBJECTIVE: Vitals:   02/24/23 1107  BP: (!) 140/96  Pulse: 67  Resp: 18  Temp: (!) 96.5 F (35.8 C)   SpO2: 100%     Body mass index is 28.55 kg/m.    ECOG FS:0 - Asymptomatic  General: Well-developed, well-nourished, no acute distress. Eyes: Pink conjunctiva, anicteric sclera. HEENT: Normocephalic, moist mucous membranes. Lungs: No audible wheezing or coughing. Heart: Regular rate and rhythm. Abdomen: Soft, nontender, no obvious distention.  Colostomy bag noted. Musculoskeletal: No edema, cyanosis, or clubbing. Neuro: Alert, answering all questions appropriately. Cranial nerves grossly intact. Skin: No rashes or petechiae noted. Psych: Normal affect.  LAB RESULTS:  Lab Results  Component Value Date   NA 132 (L) 02/17/2023   K 3.9 02/17/2023   CL 103 02/17/2023   CO2 22 02/17/2023   GLUCOSE 101 (H) 02/17/2023  BUN 13 02/17/2023   CREATININE 1.08 02/17/2023   CALCIUM 8.8 (L) 02/17/2023   PROT 7.1 02/17/2023   ALBUMIN 4.0 02/17/2023   AST 16 02/17/2023   ALT 15 02/17/2023   ALKPHOS 76 02/17/2023   BILITOT 0.3 02/17/2023   GFRNONAA >60 02/17/2023    Lab Results  Component Value Date   WBC 7.5 02/17/2023   NEUTROABS 4.8 02/17/2023   HGB 13.3 02/17/2023   HCT 39.2 02/17/2023   MCV 94.7 02/17/2023   PLT 236 02/17/2023     STUDIES: CT CHEST ABDOMEN PELVIS W CONTRAST  Result Date: 02/23/2023 CLINICAL DATA:  Rectal cancer restaging, additional history of renal cell carcinoma * Tracking Code: BO * EXAM: CT CHEST, ABDOMEN, AND PELVIS WITH CONTRAST TECHNIQUE: Multidetector CT imaging of the chest, abdomen and pelvis was performed following the standard protocol during bolus administration of intravenous contrast. RADIATION DOSE REDUCTION: This exam was performed according to the departmental dose-optimization program which includes automated exposure control, adjustment of the mA and/or kV according to patient size and/or use of iterative reconstruction technique. CONTRAST:  OMNIPAQUE IOHEXOL 300 MG/ML SOLN additional oral enteric contrast COMPARISON:  08/14/2022  FINDINGS: CT CHEST FINDINGS Cardiovascular: No significant vascular findings. Normal heart size. Left coronary artery calcifications. No pericardial effusion. Mediastinum/Nodes: No enlarged mediastinal, hilar, or axillary lymph nodes. Thyroid gland, trachea, and esophagus demonstrate no significant findings. Lungs/Pleura: Mild paraseptal emphysema. Diffuse bilateral bronchial wall thickening. Very extensive ground-glass and fine centrilobular nodularity throughout the lungs. No pleural effusion or pneumothorax. Musculoskeletal: No chest wall abnormality. No acute osseous findings. CT ABDOMEN PELVIS FINDINGS Hepatobiliary: No solid liver abnormality is seen. Contracted gallbladder. No gallstones, gallbladder wall thickening, or biliary dilatation. Pancreas: Unremarkable. No pancreatic ductal dilatation or surrounding inflammatory changes. Spleen: Normal in size without significant abnormality. Adrenals/Urinary Tract: Adrenal glands are unremarkable. Unchanged postoperative appearance of the inferior pole of the left kidney status post partial nephrectomy (series 3, image 83). Kidneys are otherwise normal, without renal calculi, solid lesion, or hydronephrosis. Bladder is unremarkable. Stomach/Bowel: Stomach is within normal limits. Appendix appears diminutive although normal. Status post abdominoperineal resection with left lower quadrant end colostomy. No evidence of bowel wall thickening, distention, or inflammatory changes. Vascular/Lymphatic: Aortic atherosclerosis. No enlarged abdominal or pelvic lymph nodes. Reproductive: No mass or other abnormality. Other: No abdominal wall hernia. Left lower quadrant end colostomy. No ascites. Musculoskeletal: No acute osseous findings. IMPRESSION: 1. Status post abdominoperineal resection with left lower quadrant end colostomy. No evidence of local recurrence or metastatic disease in the chest, abdomen, or pelvis. 2. Unchanged postoperative appearance of the inferior pole  of the left kidney status post partial nephrectomy. No evidence of recurrent or metastatic disease. 3. Mild paraseptal emphysema. Diffuse bilateral bronchial wall thickening with very extensive ground-glass and fine centrilobular nodularity throughout the lungs, consistent with smoking-related respiratory bronchiolitis. 4. Coronary artery disease. Aortic Atherosclerosis (ICD10-I70.0) and Emphysema (ICD10-J43.9). Electronically Signed   By: Jearld Lesch M.D.   On: 02/23/2023 06:58    ASSESSMENT: Pathologic stage IIa rectal cancer, stage I clear cell carcinoma kidney.   PLAN:    Pathologic stage IIa rectal cancer:  Patient completed his neoadjuvant treatment with chemotherapy and XRT on August 17, 2020.  He subsequently underwent treatment with chemotherapy only using FOLFOX and completed 6 cycles on December 12, 2020.  He underwent surgical resection on February 05, 2021.  His most recent imaging on February 19, 2023 reviewed independently and reported as above with no obvious evidence of recurrent or progressive  disease.  Patient's CEA also continues to be within normal limits.  No intervention is needed at this time.  Return to clinic in 6 months with repeat laboratory work, imaging, and further evaluation.  Will continue imaging every 6 months through August 2025 and then switch to yearly imaging and evaluation.     Pain: Resolved. Stage I clear cell carcinoma: Patient is status post resection on February 05, 2021.  He does not require adjuvant treatment.  Imaging as above. Anxiety: Patient does not complain of this today.   Ostomy care: Continue follow-up with ostomy clinic.   Patient expressed understanding and was in agreement with this plan. He also understands that He can call clinic at any time with any questions, concerns, or complaints.    Cancer Staging  Rectal cancer [facility]) Staging form: Colon and Rectum, AJCC 8th Edition - Clinical stage from 06/22/2020: Stage IIIB (cT3, cN1a, cM0) - Signed by  Jeralyn Ruths, MD on 06/22/2020 Stage prefix: Initial diagnosis Total positive nodes: 1   Jeralyn Ruths, MD   02/24/2023 2:20 PM

## 2023-02-24 NOTE — Progress Notes (Signed)
Patient having pain where surgery site is located.

## 2023-03-27 IMAGING — XA IR IMAGING GUIDED PORT INSERTION
3 series · 7 of 7 positions shown · non-contrast
Comparison: none

CLINICAL DATA: History of rectal carcinoma and need for porta cath
for continued chemotherapy.

[Series 1: ir fluoro/shunt/fist · 2 of 2 slices shown]
[im 1/2]
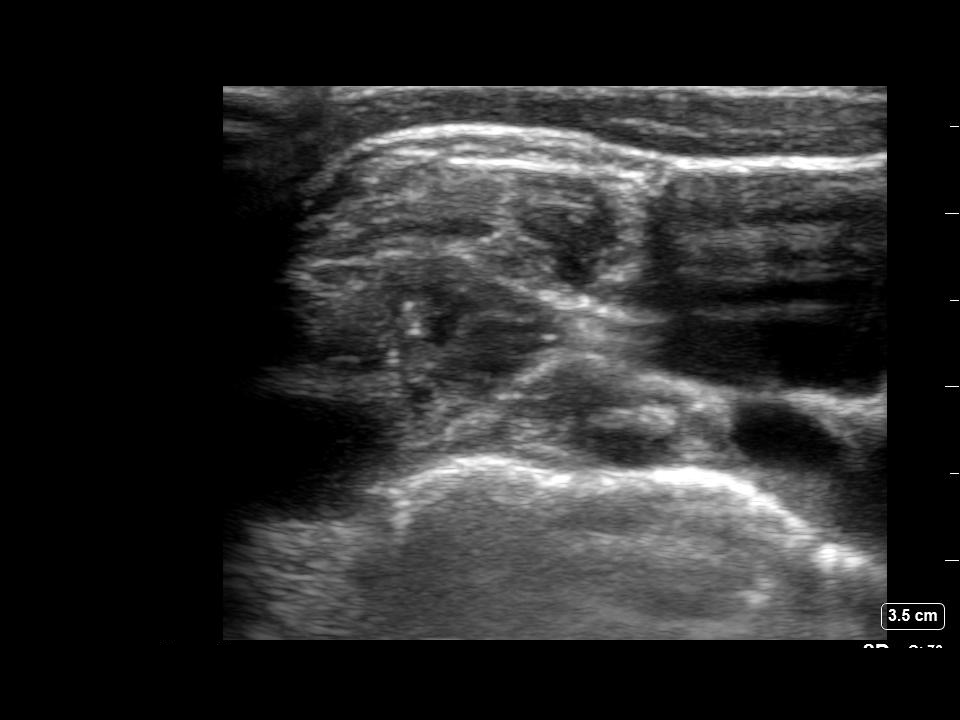
[im 2/2]
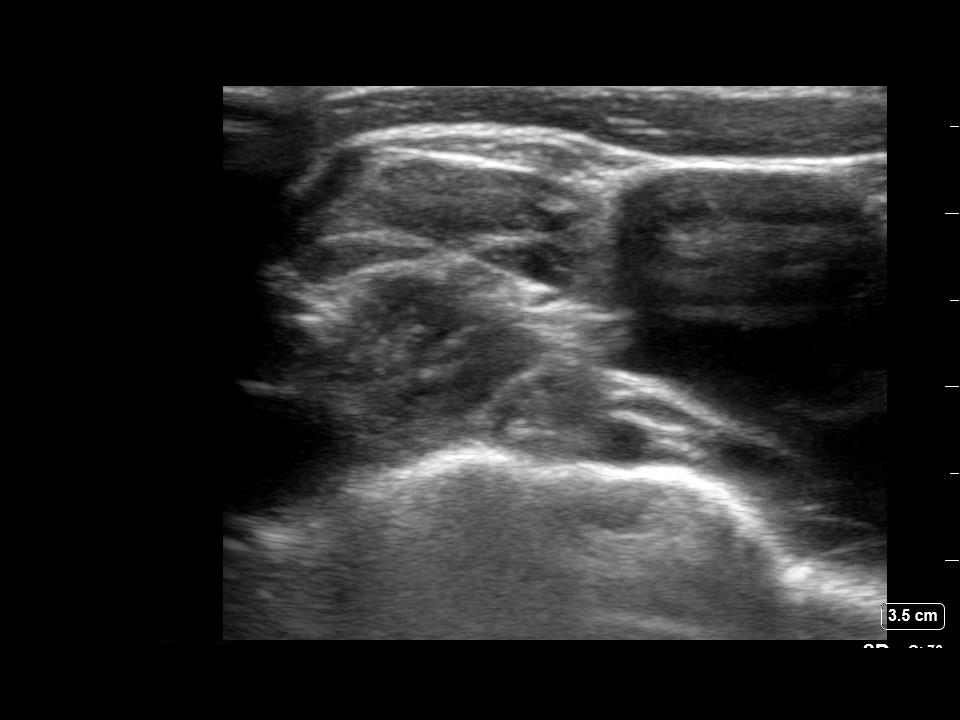

[Series 1: fluoroscopy - stored · 4 of 15 frames shown (1 of 2)]
[frame 1/15]
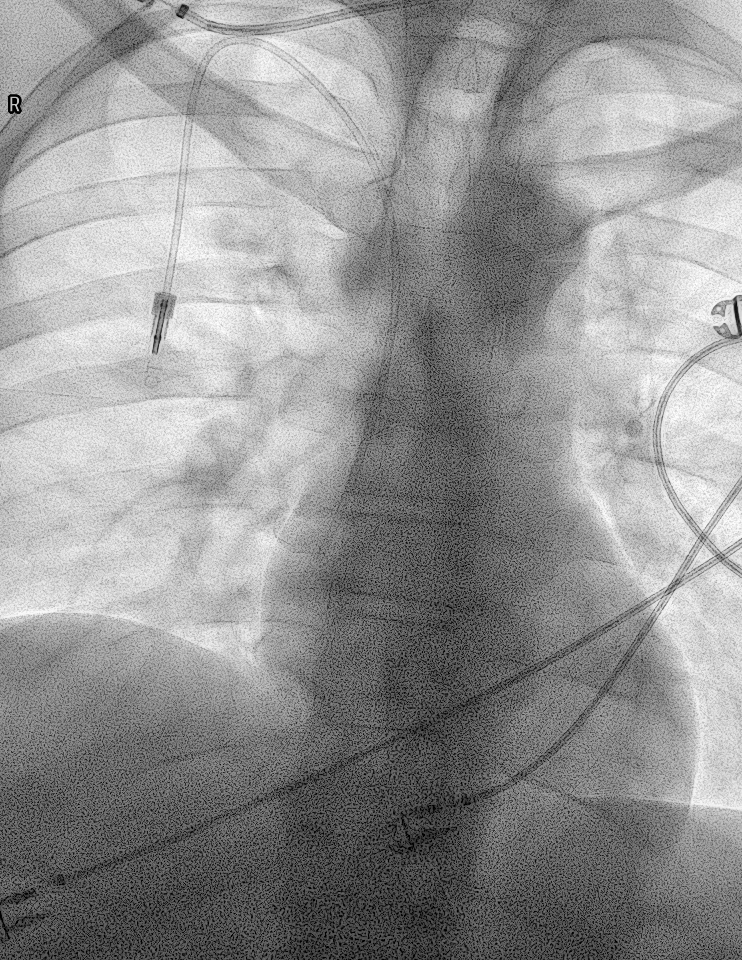
[frame 3/15]
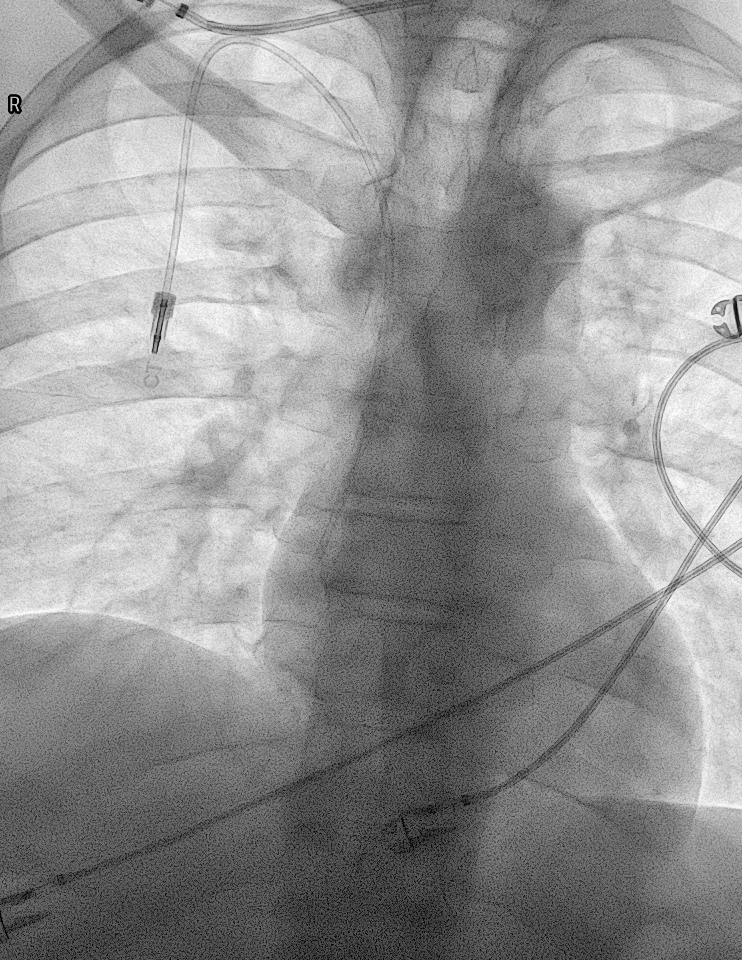
[frame 8/15]
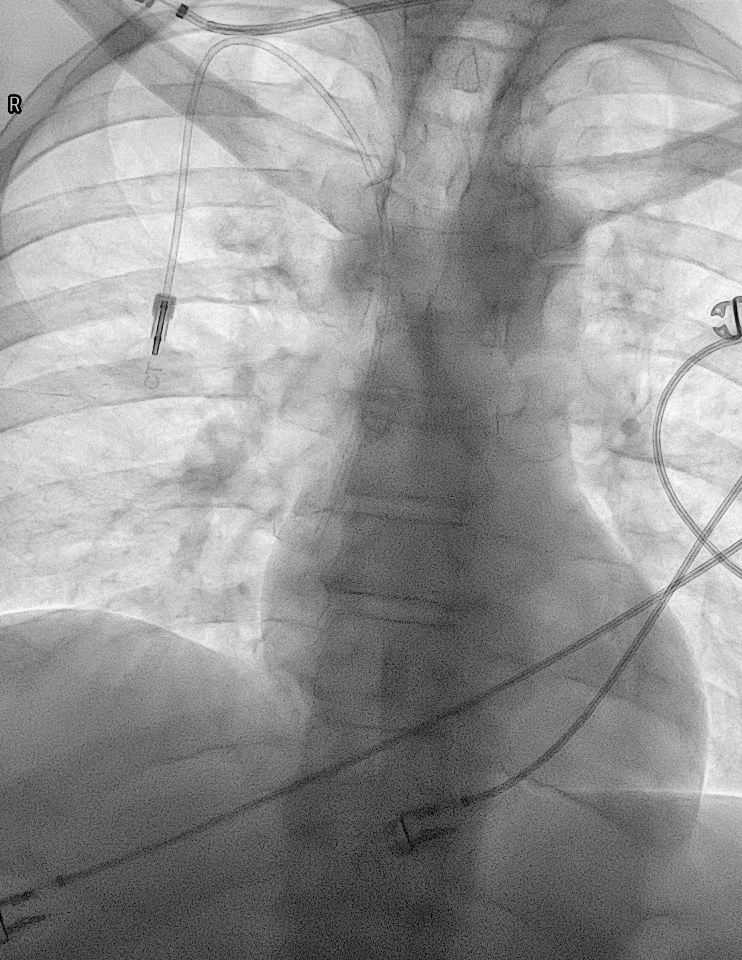
[frame 13/15]
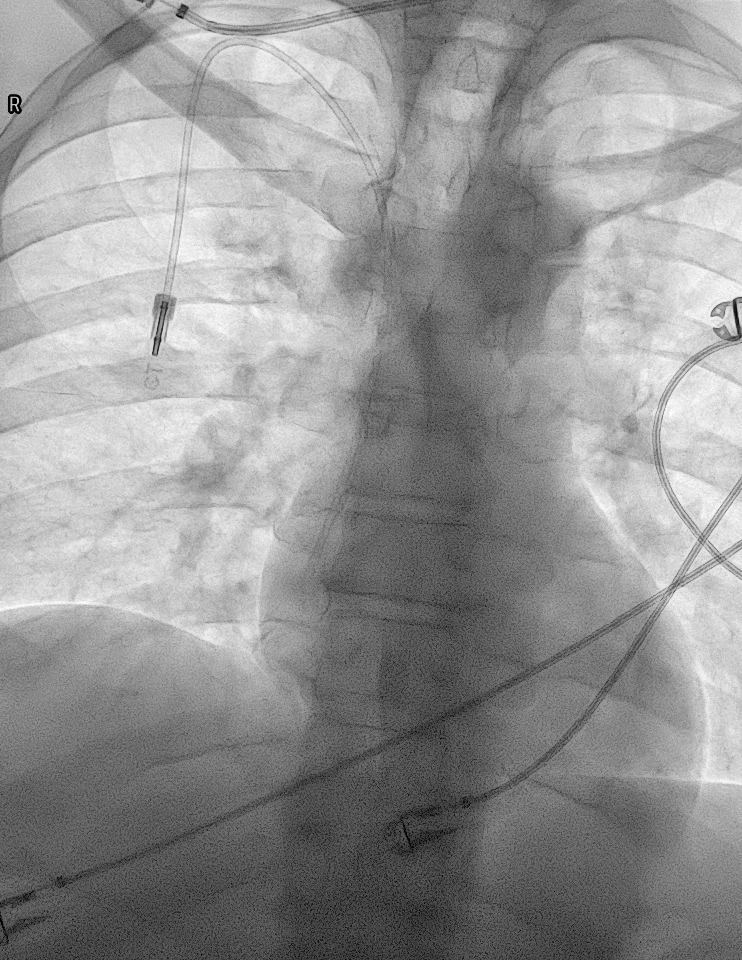

[Series 1: fluoroscopy - stored · 1 of 1 slices shown (2 of 2)]
[im 1/1]
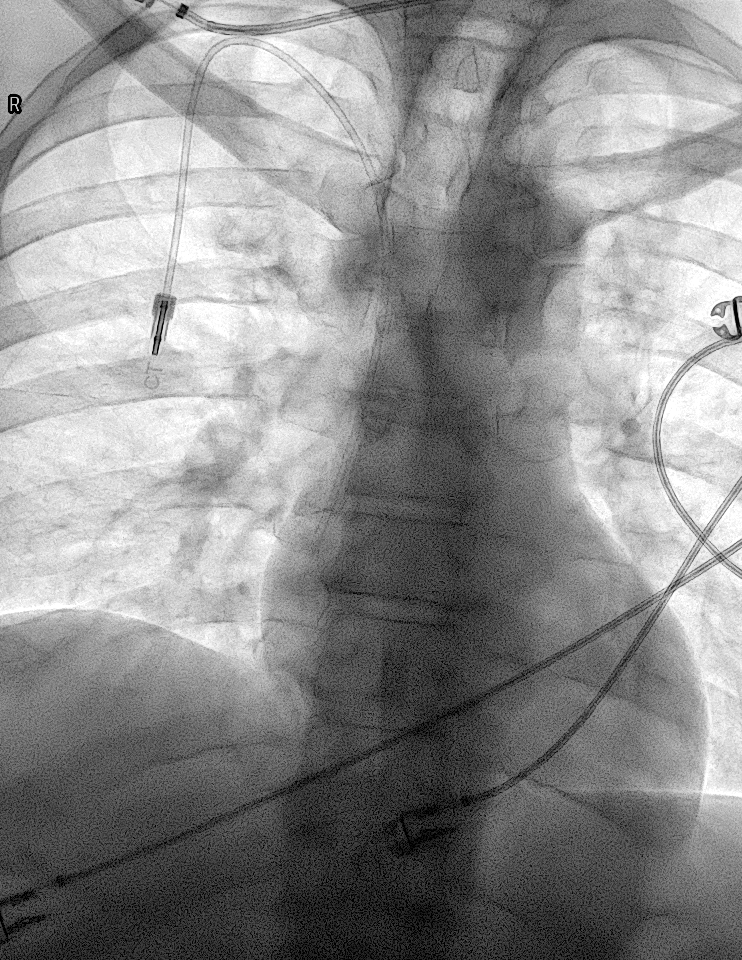

[7 of 7 positions shown; findings below may reference images not displayed]

EXAM:
IMPLANTED PORT A CATH PLACEMENT WITH ULTRASOUND AND FLUOROSCOPIC
GUIDANCE

ANESTHESIA/SEDATION:
2.0 mg IV Versed; 100 mcg IV Fentanyl

Total Moderate Sedation Time:  32 minutes

The patient's level of consciousness and physiologic status were
continuously monitored during the procedure by Radiology nursing.

FLUOROSCOPY TIME:  18 seconds.  2.0 mGy.

PROCEDURE:
The procedure, risks, benefits, and alternatives were explained to
the patient. Questions regarding the procedure were encouraged and
answered. The patient understands and consents to the procedure. A
time-out was performed prior to initiating the procedure.

Ultrasound was utilized to confirm patency of the right internal
jugular vein. The right neck and chest were prepped with
chlorhexidine in a sterile fashion, and a sterile drape was applied
covering the operative field. Maximum barrier sterile technique with
sterile gowns and gloves were used for the procedure. Local
anesthesia was provided with 1% lidocaine.

After creating a small venotomy incision, a 21 gauge needle was
advanced into the right internal jugular vein under direct,
real-time ultrasound guidance. Ultrasound image documentation was
performed. After securing guidewire access, an 8 Fr dilator was
placed. A J-wire was kinked to measure appropriate catheter length.

A subcutaneous port pocket was then created along the upper chest
wall utilizing sharp and blunt dissection. Portable cautery was
utilized. The pocket was irrigated with sterile saline.

A single lumen power injectable port was chosen for placement. The 8
Fr catheter was tunneled from the port pocket site to the venotomy
incision. The port was placed in the pocket. External catheter was
trimmed to appropriate length based on guidewire measurement.

At the venotomy, an 8 Fr peel-away sheath was placed over a
guidewire. The catheter was then placed through the sheath and the
sheath removed. Final catheter positioning was confirmed and
documented with a fluoroscopic spot image. The port was accessed
with a needle and aspirated and flushed with heparinized saline. The
access needle was removed.

The venotomy and port pocket incisions were closed with subcutaneous
3-0 Vicryl and subcuticular 4-0 Monocryl. Dermabond was applied to
both incisions.

COMPLICATIONS:
COMPLICATIONS
None
FINDINGS: After catheter placement, the tip lies at the [place] junction.
The catheter aspirates normally and is ready for immediate use.
IMPRESSION: Placement of single lumen port a cath via right internal jugular
vein. The catheter tip lies at the [place] junction. A power
injectable port a cath was placed and is ready for immediate use.

## 2023-08-26 ENCOUNTER — Other Ambulatory Visit: Payer: Self-pay | Admitting: *Deleted

## 2023-08-26 DIAGNOSIS — C20 Malignant neoplasm of rectum: Secondary | ICD-10-CM

## 2023-08-26 NOTE — Progress Notes (Signed)
 cb

## 2023-08-27 ENCOUNTER — Inpatient Hospital Stay: Payer: Medicare Other

## 2023-08-27 ENCOUNTER — Inpatient Hospital Stay: Payer: Medicare Other | Attending: Oncology

## 2023-08-27 ENCOUNTER — Ambulatory Visit
Admission: RE | Admit: 2023-08-27 | Discharge: 2023-08-27 | Disposition: A | Discharge status: Home / Self Care | Payer: Medicare Other | Source: Ambulatory Visit | Attending: Oncology | Admitting: Oncology

## 2023-08-27 DIAGNOSIS — C20 Malignant neoplasm of rectum: Secondary | ICD-10-CM

## 2023-08-27 DIAGNOSIS — Z85048 Personal history of other malignant neoplasm of rectum, rectosigmoid junction, and anus: Secondary | ICD-10-CM | POA: Insufficient documentation

## 2023-08-27 DIAGNOSIS — Z85528 Personal history of other malignant neoplasm of kidney: Secondary | ICD-10-CM | POA: Diagnosis present

## 2023-08-27 LAB — CBC WITH DIFFERENTIAL/PLATELET
Abs Immature Granulocytes: 0.02 10*3/uL (ref 0.00–0.07)
Basophils Absolute: 0.1 10*3/uL (ref 0.0–0.1)
Basophils Relative: 1 %
Eosinophils Absolute: 0.3 10*3/uL (ref 0.0–0.5)
Eosinophils Relative: 4 %
HCT: 45.4 % (ref 39.0–52.0)
Hemoglobin: 15.4 g/dL (ref 13.0–17.0)
Immature Granulocytes: 0 %
Lymphocytes Relative: 26 %
Lymphs Abs: 1.8 10*3/uL (ref 0.7–4.0)
MCH: 32.7 pg (ref 26.0–34.0)
MCHC: 33.9 g/dL (ref 30.0–36.0)
MCV: 96.4 fL (ref 80.0–100.0)
Monocytes Absolute: 0.4 10*3/uL (ref 0.1–1.0)
Monocytes Relative: 6 %
Neutro Abs: 4.2 10*3/uL (ref 1.7–7.7)
Neutrophils Relative %: 63 %
Platelets: 211 10*3/uL (ref 150–400)
RBC: 4.71 MIL/uL (ref 4.22–5.81)
RDW: 13.3 % (ref 11.5–15.5)
WBC: 6.8 10*3/uL (ref 4.0–10.5)
nRBC: 0 % (ref 0.0–0.2)

## 2023-08-27 LAB — CMP (CANCER CENTER ONLY)
ALT: 16 U/L (ref 0–44)
AST: 20 U/L (ref 15–41)
Albumin: 4.3 g/dL (ref 3.5–5.0)
Alkaline Phosphatase: 69 U/L (ref 38–126)
Anion gap: 8 (ref 5–15)
BUN: 12 mg/dL (ref 6–20)
CO2: 22 mmol/L (ref 22–32)
Calcium: 9 mg/dL (ref 8.9–10.3)
Chloride: 106 mmol/L (ref 98–111)
Creatinine: 1.06 mg/dL (ref 0.61–1.24)
GFR, Estimated: >60 mL/min (ref >60)
Glucose, Bld: 111 mg/dL — ABNORMAL HIGH (ref 70–99)
Potassium: 4.5 mmol/L (ref 3.5–5.1)
Sodium: 136 mmol/L (ref 135–145)
Total Bilirubin: 0.7 mg/dL (ref 0.0–1.2)
Total Protein: 7.5 g/dL (ref 6.5–8.1)

## 2023-08-27 MED ORDER — BARIUM SULFATE 2 % PO SUSP
450.0000 mL | Freq: Once | ORAL | Status: AC
  Administered 2023-08-27: 450 mL via ORAL

## 2023-08-27 MED ORDER — IOHEXOL 300 MG/ML  SOLN
100.0000 mL | Freq: Once | INTRAMUSCULAR | Status: AC | PRN
  Administered 2023-08-27: 100 mL via INTRAVENOUS

## 2023-08-28 LAB — CEA: CEA: 4.1 ng/mL (ref 0.0–4.7)

## 2023-09-03 ENCOUNTER — Encounter: Payer: Self-pay | Admitting: Oncology

## 2023-09-03 ENCOUNTER — Inpatient Hospital Stay: Payer: Medicare Other | Attending: Oncology | Admitting: Oncology

## 2023-09-03 VITALS — BP 134/100 | HR 80 | Temp 98.0°F | Resp 16 | Ht 74.0 in | Wt 220.0 lb

## 2023-09-03 DIAGNOSIS — Z85048 Personal history of other malignant neoplasm of rectum, rectosigmoid junction, and anus: Secondary | ICD-10-CM | POA: Insufficient documentation

## 2023-09-03 DIAGNOSIS — Z801 Family history of malignant neoplasm of trachea, bronchus and lung: Secondary | ICD-10-CM | POA: Insufficient documentation

## 2023-09-03 DIAGNOSIS — Z808 Family history of malignant neoplasm of other organs or systems: Secondary | ICD-10-CM | POA: Diagnosis not present

## 2023-09-03 DIAGNOSIS — Z803 Family history of malignant neoplasm of breast: Secondary | ICD-10-CM | POA: Diagnosis not present

## 2023-09-03 DIAGNOSIS — Z807 Family history of other malignant neoplasms of lymphoid, hematopoietic and related tissues: Secondary | ICD-10-CM | POA: Insufficient documentation

## 2023-09-03 DIAGNOSIS — Z8042 Family history of malignant neoplasm of prostate: Secondary | ICD-10-CM | POA: Diagnosis not present

## 2023-09-03 DIAGNOSIS — F419 Anxiety disorder, unspecified: Secondary | ICD-10-CM | POA: Diagnosis not present

## 2023-09-03 DIAGNOSIS — Z85528 Personal history of other malignant neoplasm of kidney: Secondary | ICD-10-CM | POA: Insufficient documentation

## 2023-09-03 DIAGNOSIS — C20 Malignant neoplasm of rectum: Secondary | ICD-10-CM

## 2023-09-03 NOTE — Progress Notes (Signed)
 Survivorship Care Plan visit completed.  Treatment summary reviewed and given to patient.  ASCO answers booklet reviewed and given to patient.  CARE program and Cancer Transitions discussed with patient along with other resources cancer center offers to patients and caregivers.  Patient verbalized understanding.

## 2023-09-03 NOTE — Progress Notes (Signed)
 [facility]  [phone] [phone]  ID: Malik Lynch OB: Sep 22, 1970  MR#: [id number]  EXB#:[id number]  Patient Care Team: Kaleen Mask, MD as PCP - General (Family Medicine) Benita Gutter, RN as Oncology Nurse Navigator Orlie Dakin, Tollie Pizza, MD as Consulting Physician (Oncology)  CHIEF COMPLAINT: Pathologic stage IIa rectal cancer, stage I clear cell carcinoma kidney.  INTERVAL HISTORY: Patient returns to clinic today for routine 2-month evaluation and discussion of his imaging results.  He currently feels well and is asymptomatic.  He does not complain of back pain today.  He is having no issues with his colostomy bag.  He denies any other pain. He has no neurologic complaints. He denies any recent fevers or illnesses. He has no chest pain, shortness of breath, cough, or hemoptysis.  He denies any nausea, vomiting, constipation, or diarrhea.  He has no melena or hematochezia.  He has no urinary complaints.  Patient offers no specific complaints today.  REVIEW OF SYSTEMS:   Review of Systems  Constitutional: Negative.  Negative for fever, malaise/fatigue and weight loss.  Respiratory: Negative.  Negative for cough, hemoptysis and shortness of breath.   Cardiovascular: Negative.  Negative for chest pain and leg swelling.  Gastrointestinal:  Negative for abdominal pain, blood in stool, diarrhea and melena.  Genitourinary: Negative.  Negative for urgency.  Musculoskeletal: Negative.  Negative for back pain.  Skin: Negative.  Negative for rash.  Neurological: Negative.  Negative for dizziness, focal weakness, weakness and headaches.  Psychiatric/Behavioral: Negative.  The patient is not nervous/anxious and does not have insomnia.     As per HPI. Otherwise, a complete review of systems is negative.  PAST MEDICAL HISTORY: Past Medical History:  Diagnosis Date   Family history of breast cancer    Family history of colon cancer     Family history of kidney cancer    Family history of prostate cancer    Family history of uterine cancer     PAST SURGICAL HISTORY: Past Surgical History:  Procedure Laterality Date   IR IMAGING GUIDED PORT INSERTION  09/29/2020   IR REMOVAL TUN ACCESS W/ PORT W/O FL MOD SED  03/26/2022    FAMILY HISTORY: Family History  Problem Relation Age of Onset   Throat cancer Mother 23   Lymphoma Maternal Aunt    Kidney cancer Maternal Uncle    Breast cancer Paternal Aunt    Colon cancer Paternal Uncle    Lung cancer Maternal Grandmother    Cancer Maternal Grandmother        mouth   Prostate cancer Maternal Grandfather        dx 25s   Colon cancer Maternal Grandfather        dx 80s   Lung cancer Maternal Uncle    Cancer Maternal Uncle        possibly kidney   Uterine cancer Paternal Aunt    Cancer Paternal Aunt        reproductive   Kidney cancer Paternal Uncle    Breast cancer Cousin    Breast cancer Cousin    Colon cancer Cousin        dx 22s    ADVANCED DIRECTIVES (Y/N):  N  HEALTH MAINTENANCE: Social History   Tobacco Use   Smoking status: Some Days    Current packs/day: 1.00    Types: Cigarettes   Smokeless tobacco: Never  Vaping Use   Vaping status: Never Used  Substance Use Topics   Alcohol  use: Yes    Alcohol/week: 1.0 standard drink of alcohol    Types: 1 Cans of beer per week   Drug use: No     Colonoscopy:  PAP:  Bone density:  Lipid panel:  No Known Allergies  Current Outpatient Medications  Medication Sig Dispense Refill   cyclobenzaprine (FLEXERIL) 10 MG tablet Take 1 tablet (10 mg total) by mouth daily. (Patient not taking: Reported on 09/03/2023) 60 tablet 0   ibuprofen (ADVIL,MOTRIN) 100 MG tablet Take 800 mg by mouth every 6 (six) hours as needed. (Patient not taking: Reported on 09/03/2023)     ketoconazole (NIZORAL) 2 % shampoo Apply 1 application topically as directed. 3 times per week wash from scalp down for 6 weeks then once monthly for  prevention. Leave on for a few minutes then rinse. (Patient not taking: Reported on 09/03/2023) 120 mL 6   lactulose (CHRONULAC) 10 GM/15ML solution Take 15-30 mLs (10-20 g total) by mouth 2 (two) times daily as needed for severe constipation. (Patient not taking: Reported on 09/03/2023) 236 mL 0   lidocaine-prilocaine (EMLA) cream Apply to affected area once (Patient not taking: Reported on 09/03/2023) 30 g 3   loperamide (IMODIUM A-D) 2 MG tablet Take 2 mg by mouth 4 (four) times daily as needed for diarrhea or loose stools. (Patient not taking: Reported on 09/03/2023)     ondansetron (ZOFRAN) 8 MG tablet Take 1 tablet (8 mg total) by mouth every 8 (eight) hours as needed for nausea or vomiting. (Patient not taking: Reported on 09/03/2023) 45 tablet 0   oxyCODONE (OXYCONTIN) 10 mg 12 hr tablet Take 1 tablet (10 mg total) by mouth every 12 (twelve) hours. (Patient not taking: Reported on 09/03/2023) 30 tablet 0   oxyCODONE-acetaminophen (PERCOCET/ROXICET) 5-325 MG tablet TAKE 1-2 TABLETS BY MOUTH EVERY 4 HOURS AS NEEDED FOR SEVERE PAIN. (Patient not taking: Reported on 09/03/2023) 90 tablet 0   polyethylene glycol (MIRALAX / GLYCOLAX) 17 g packet Take 17 g by mouth daily. (Patient not taking: Reported on 09/03/2023)     prochlorperazine (COMPAZINE) 10 MG tablet TAKE ONE TABLET BY MOUTH EVERY 6 HOURS AS NEEDED FOR NAUSEA / VOMITING (Patient not taking: Reported on 09/03/2023) 60 tablet 1   senna (SENOKOT) 8.6 MG TABS tablet Take 1 tablet (8.6 mg total) by mouth daily. (Patient not taking: Reported on 09/03/2023) 120 tablet 3   Sennosides-Docusate Sodium 8.6-50 MG CAPS Take 1 tablet by mouth as needed for constipation. (Patient not taking: Reported on 09/03/2023)     tamsulosin (FLOMAX) 0.4 MG CAPS capsule Take 1 capsule (0.4 mg total) by mouth daily. (Patient not taking: Reported on 09/03/2023) 90 capsule 2   No current facility-administered medications for this visit.    OBJECTIVE: Vitals:   09/03/23 1039  BP: (!)  134/100  Pulse: 80  Resp: 16  Temp: 98 F (36.7 C)  SpO2: 99%     Body mass index is 28.25 kg/m.    ECOG FS:0 - Asymptomatic  General: Well-developed, well-nourished, no acute distress. Eyes: Pink conjunctiva, anicteric sclera. HEENT: Normocephalic, moist mucous membranes. Lungs: No audible wheezing or coughing. Heart: Regular rate and rhythm. Abdomen: Soft, nontender, no obvious distention. Musculoskeletal: No edema, cyanosis, or clubbing. Neuro: Alert, answering all questions appropriately. Cranial nerves grossly intact. Skin: No rashes or petechiae noted. Psych: Normal affect.  LAB RESULTS:  Lab Results  Component Value Date   NA 136 08/27/2023   K 4.5 08/27/2023   CL 106 08/27/2023   CO2 22  08/27/2023   GLUCOSE 111 (H) 08/27/2023   BUN 12 08/27/2023   CREATININE 1.06 08/27/2023   CALCIUM 9.0 08/27/2023   PROT 7.5 08/27/2023   ALBUMIN 4.3 08/27/2023   AST 20 08/27/2023   ALT 16 08/27/2023   ALKPHOS 69 08/27/2023   BILITOT 0.7 08/27/2023   GFRNONAA >60 08/27/2023    Lab Results  Component Value Date   WBC 6.8 08/27/2023   NEUTROABS 4.2 08/27/2023   HGB 15.4 08/27/2023   HCT 45.4 08/27/2023   MCV 96.4 08/27/2023   PLT 211 08/27/2023     STUDIES: CT CHEST ABDOMEN PELVIS W CONTRAST Result Date: 08/27/2023 CLINICAL DATA:  Rectal cancer, follow-up.  * Tracking Code: BO * EXAM: CT CHEST, ABDOMEN, AND PELVIS WITH CONTRAST TECHNIQUE: Multidetector CT imaging of the chest, abdomen and pelvis was performed following the standard protocol during bolus administration of intravenous contrast. RADIATION DOSE REDUCTION: This exam was performed according to the departmental dose-optimization program which includes automated exposure control, adjustment of the mA and/or kV according to patient size and/or use of iterative reconstruction technique. CONTRAST:  OMNIPAQUE IOHEXOL 300 MG/ML  SOLN COMPARISON:  Multiple priors including CT February 19, 2023 FINDINGS: CT CHEST  FINDINGS Cardiovascular: No significant vascular findings. Normal heart size. No pericardial effusion. Mediastinum/Nodes: No supraclavicular adenopathy. No suspicious thyroid nodule. No pathologically enlarged mediastinal, hilar or axillary lymph nodes. The esophagus is grossly unremarkable. Lungs/Pleura: Mild emphysema. Extensive ground-glass and fine centrilobular pulmonary nodules are similar prior. Mild interlobular septal thickening and diffuse bronchial wall thickening. Musculoskeletal: Gynecomastia. No aggressive lytic or blastic lesion of bone. CT ABDOMEN PELVIS FINDINGS Hepatobiliary: No suspicious hepatic lesion. Gallbladder is unremarkable. No biliary ductal dilation. Pancreas: No pancreatic ductal dilation or evidence of acute inflammation. Spleen: No splenomegaly. Adrenals/Urinary Tract: Bilateral adrenal glands appear normal. No hydronephrosis. Kidneys demonstrate symmetric enhancement. Left lower pole renal scarring status post partial nephrectomy without new suspicious enhancing nodularity in the surgical bed. Urinary bladder is unremarkable for degree of distension. Stomach/Bowel: Radiopaque enteric contrast material traverses the hepatic flexure. Stomach is unremarkable for degree of distension. No pathologic dilation of small or large bowel. No evidence of acute bowel inflammation. Prior abdominoperitoneal resection with left lower quadrant end colostomy. No new suspicious soft tissue nodularity in the surgical bed. Vascular/Lymphatic: Aortic atherosclerosis. Retroaortic left renal vein. Smooth IVC contours. The portal, splenic and superior mesenteric veins are patent. No pathologically enlarged abdominal or pelvic lymph nodes. Reproductive: Prostate is unremarkable. Other: No significant abdominopelvic free fluid. No discrete peritoneal or omental nodularity. Musculoskeletal: No aggressive lytic or blastic lesion of bone. L5-S1 discogenic disease. Degenerative change of the hips. IMPRESSION: 1.  Prior abdominoperitoneal resection with left lower quadrant end colostomy. No evidence of local recurrence or metastatic disease in the chest, abdomen or pelvis. 2. Extensive ground-glass and fine centrilobular pulmonary nodules are similar prior, likely reflecting smoking-related respiratory bronchiolitis. 3. Mild interlobular septal thickening and diffuse bronchial wall thickening, suggestive of mild pulmonary edema. 4. Left lower pole renal scarring status post partial nephrectomy without new suspicious enhancing nodularity in the surgical bed. 5. Aortic Atherosclerosis (ICD10-I70.0) and Emphysema (ICD10-J43.9). Electronically Signed   By: Maudry Mayhew M.D.   On: 08/27/2023 15:42    ASSESSMENT: Pathologic stage IIa rectal cancer, stage I clear cell carcinoma kidney.   PLAN:    Pathologic stage IIa rectal cancer:  Patient completed his neoadjuvant treatment with chemotherapy and XRT on August 17, 2020.  He subsequently underwent treatment with chemotherapy only using FOLFOX and completed  6 cycles on December 12, 2020.  He underwent surgical resection on February 05, 2021.  His most recent imaging on August 27, 2023 reviewed independently and reported as above with no obvious evidence of recurrent or progressive disease.  CEA continues to be within normal limits.  No intervention is needed at this time.  Return to clinic in 6 months with repeat laboratory work, imaging, and further evaluation.  Will continue imaging every 6 months through August 2025 and then switch to yearly imaging and evaluation.     Stage I clear cell carcinoma: Patient is status post resection on February 05, 2021.  He did not require adjuvant treatment.  Imaging as above. Anxiety: Patient does not complain of this today.   Ostomy care: Continue follow-up with ostomy clinic.   Patient expressed understanding and was in agreement with this plan. He also understands that He can call clinic at any time with any questions, concerns, or  complaints.    Cancer Staging  Rectal cancer [facility]) Staging form: Colon and Rectum, AJCC 8th Edition - Clinical stage from 06/22/2020: Stage IIIB (cT3, cN1a, cM0) - Signed by Jeralyn Ruths, MD on 06/22/2020 Stage prefix: Initial diagnosis Total positive nodes: 1   Jeralyn Ruths, MD   09/03/2023 12:06 PM

## 2024-02-17 IMAGING — CT CT CHEST-ABD-PELV W/ CM
2 of 5 series · 13 of 36 positions shown, 15 images · IV contrast (agent unspecified)
Comparison: Multiple priors including most recent CT May 02, 2021

CLINICAL DATA: Colorectal cancer, status post chemotherapy.

EXAM:
CT CHEST, ABDOMEN, AND PELVIS WITH CONTRAST
TECHNIQUE: Multidetector CT imaging of the chest, abdomen and pelvis was
performed following the standard protocol during bolus
administration of intravenous contrast.

[Series 2: cap with · axial · 0.85mm/px · z∈[-631,-41]mm · 10 of 146 slices shown, 12 images]
[im 14/146  mediastinal]
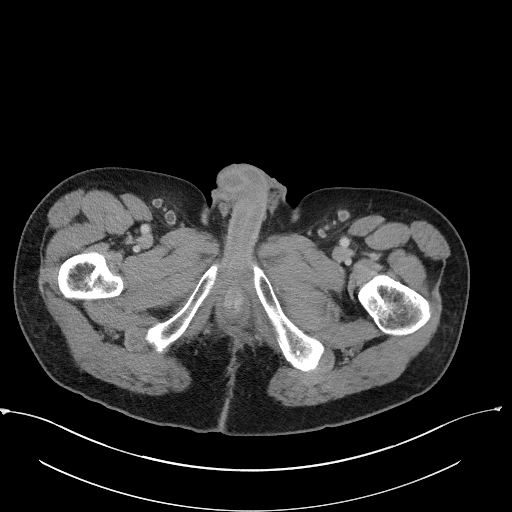
[im 14/146  bone]
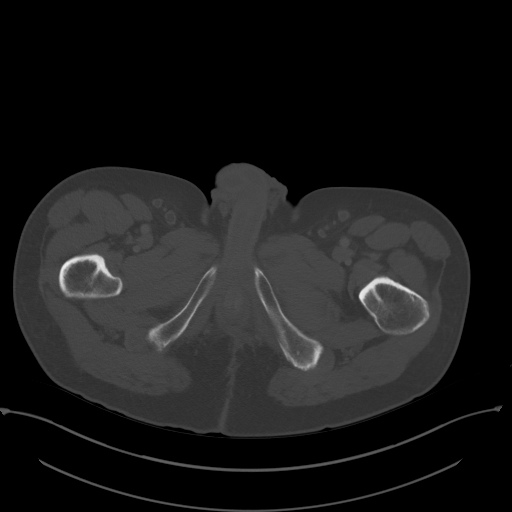
[im 27/146  mediastinal]
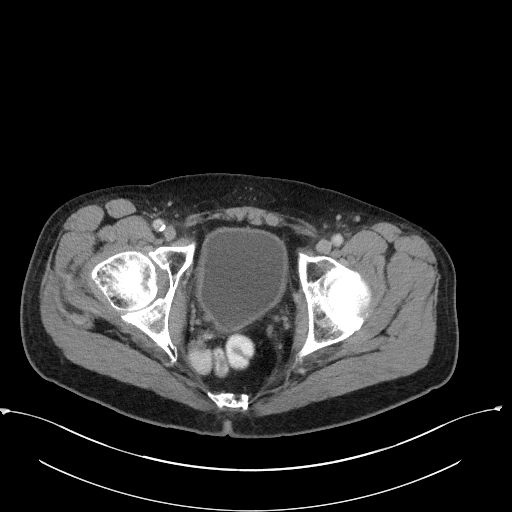
[im 40/146  mediastinal]
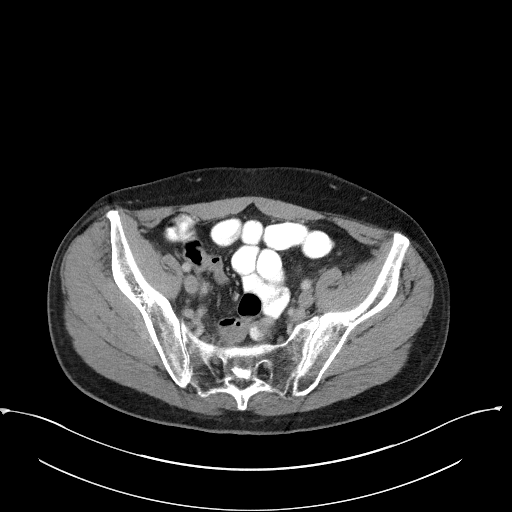
[im 53/146  mediastinal]
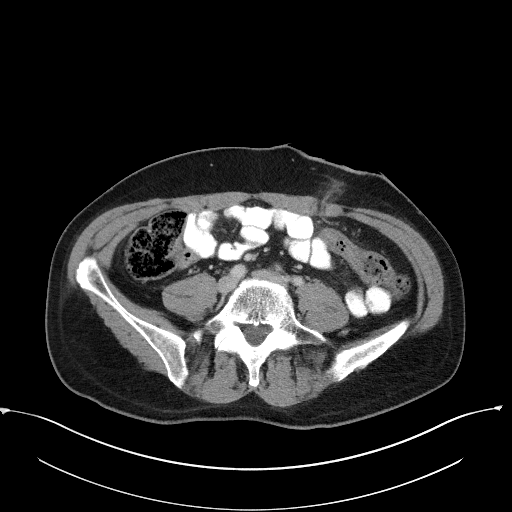
[im 66/146  mediastinal]
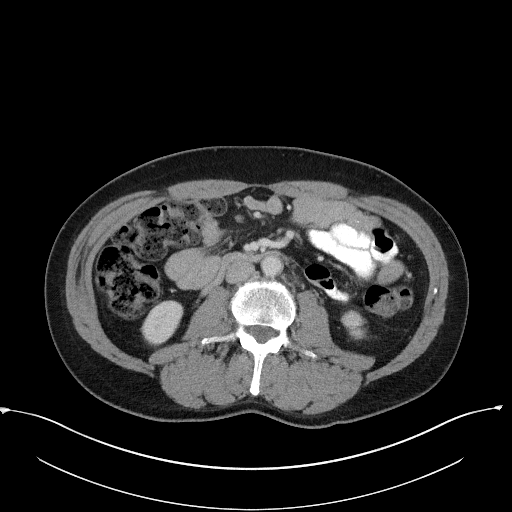
[im 80/146  mediastinal]
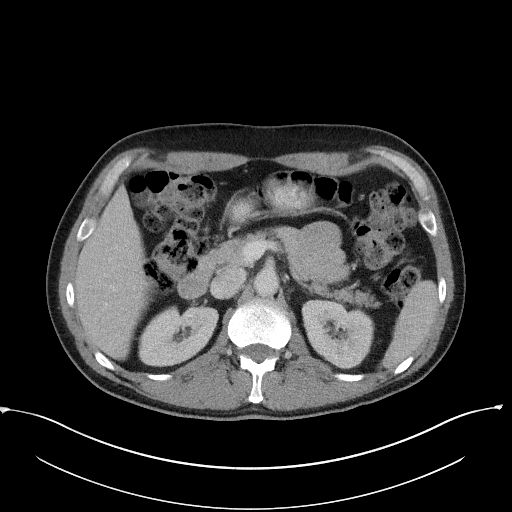
[im 93/146  mediastinal]
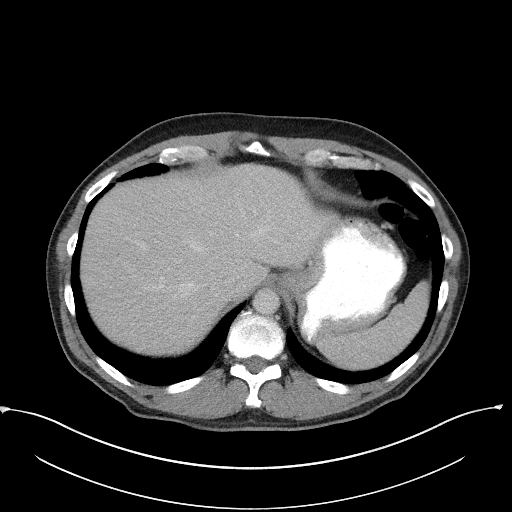
[im 106/146  mediastinal]
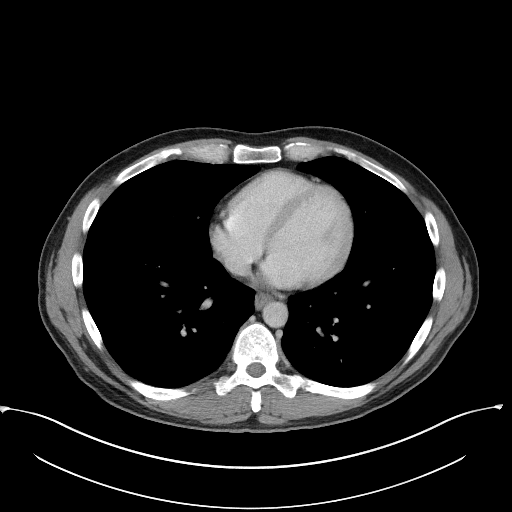
[im 119/146  mediastinal]
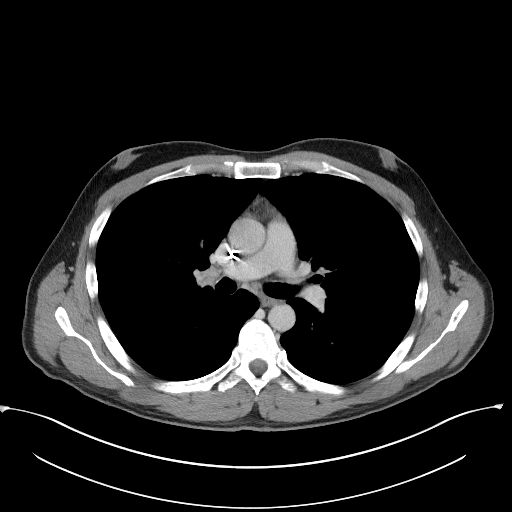
[im 119/146  bone]
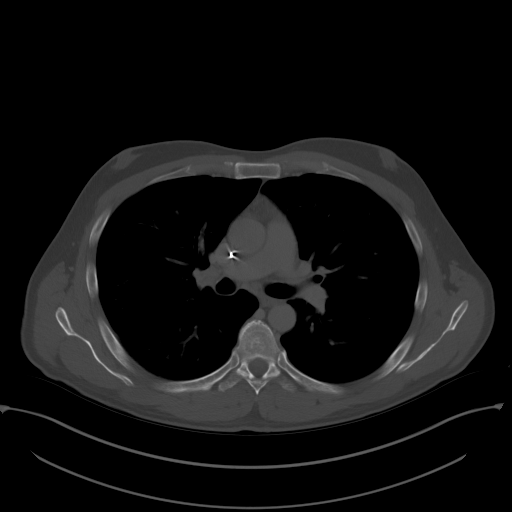
[im 132/146  mediastinal]
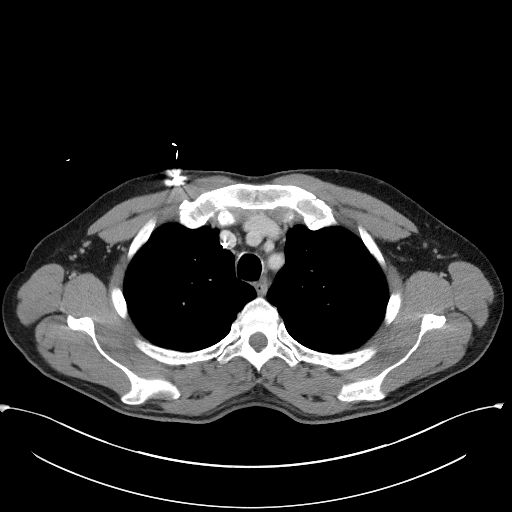

[Series 5: coronals · coronal · 0.89mm/px · 3 of 141 slices shown]
[im 29/141  mediastinal]
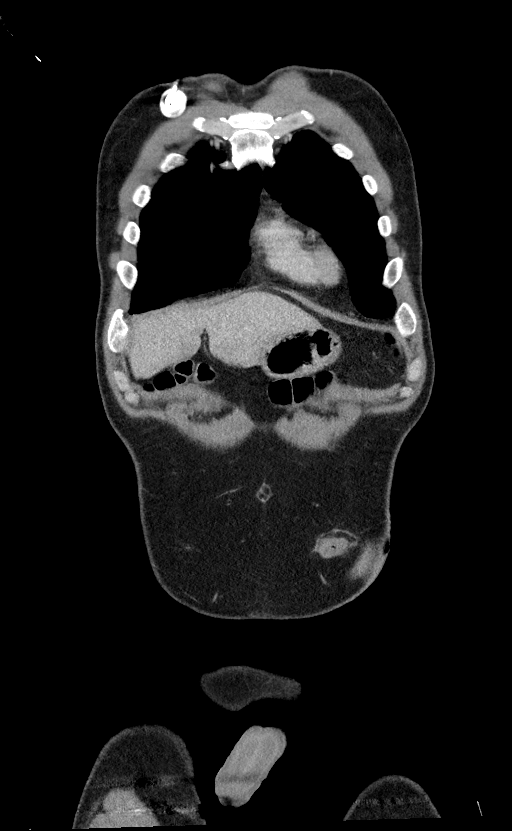
[im 57/141  mediastinal]
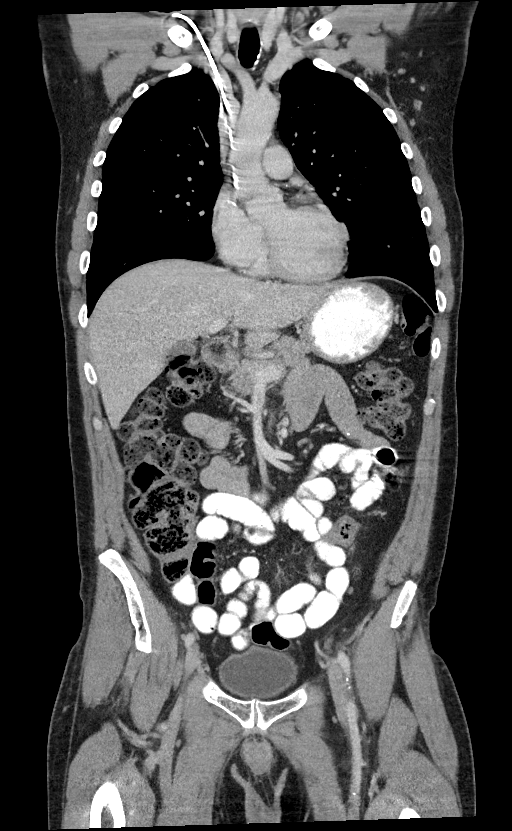
[im 85/141  mediastinal]
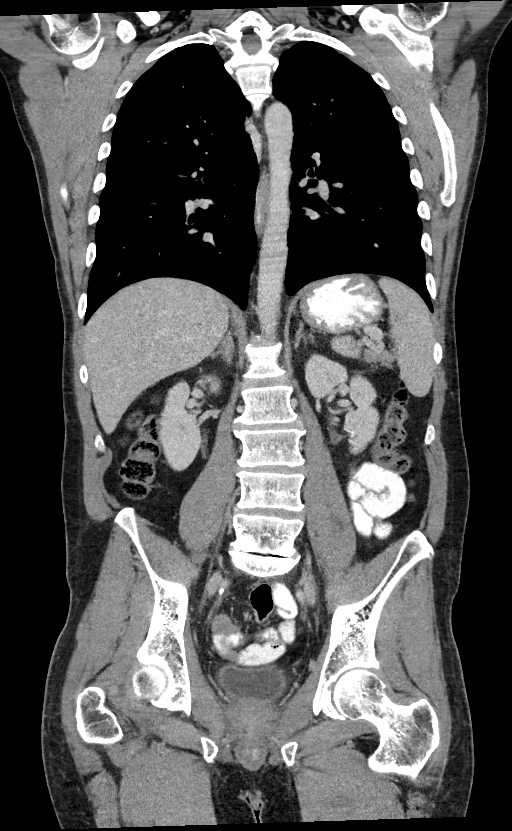

[13 of 36 positions shown; findings below may reference images not displayed]

RADIATION DOSE REDUCTION: This exam was performed according to the
departmental dose-optimization program which includes automated
exposure control, adjustment of the mA and/or kV according to
patient size and/or use of iterative reconstruction technique.

CONTRAST:  100mL OMNIPAQUE IOHEXOL 300 MG/ML  SOLN
FINDINGS: CT CHEST FINDINGS

Cardiovascular: Accessed right chest Port-A-Cath with tip at the
superior cavoatrial junction. Minimal aortic atherosclerosis without
aneurysmal dilation. No central pulmonary embolus on this
nondedicated study. Normal size heart. No significant pericardial
effusion/thickening.

Mediastinum/Nodes: No supraclavicular adenopathy. No discrete
thyroid nodule. No pathologically enlarged mediastinal, hilar or
axillary lymph nodes.

Lungs/Pleura: Mild paraseptal and centrilobular emphysema. No
suspicious pulmonary nodules or masses. No pleural effusion or
pneumothorax.

Musculoskeletal: Thoracic spondylosis. Degenerative changes
bilateral shoulders. No aggressive lytic or blastic lesion of bone.
No suspicious chest wall mass.

CT ABDOMEN PELVIS FINDINGS

Hepatobiliary: No suspicious hepatic lesion. Gallbladder is
unremarkable. No biliary ductal dilation.

Pancreas: No pancreatic ductal dilation or evidence of acute
inflammation.

Spleen: No splenomegaly or focal splenic lesion.

Adrenals/Urinary Tract: Bilateral adrenal glands appear normal.
Similar changes of partial left lobe lower pole nephrectomy without
suspicious enhancing soft tissue nodularity in the surgical bed.
Kidneys demonstrate symmetric enhancement and excretion of contrast
material. No solid enhancing renal lesion. Urinary bladder is
unremarkable for degree of distension.

Stomach/Bowel: Radiopaque enteric contrast material traverses distal
loops of small bowel. Stomach is moderately distended without
abnormal wall thickening. Postsurgical changes of abdominal perineal
resection with left anterior abdominal wall descending colostomy.
Terminal ileum and appendix appear normal. Moderate volume of formed
stool throughout the colon.

Vascular/Lymphatic: Normal caliber abdominal aorta. Circumaortic
left renal vein. No pathologically enlarged abdominal or pelvic
lymph nodes.

Reproductive: Prostate is unremarkable.

Other: No significant abdominopelvic free fluid. No discrete omental
or peritoneal nodularity.

Musculoskeletal: L5-S1 degenerative disc disease. No aggressive
lytic or blastic lesion of bone.
IMPRESSION: 1. Stable examination status post left lower pole partial
nephrectomy and abdominal perineal resection with end colostomy.
2. No new or progressive findings to suggest recurrence or
metastatic disease within the chest, abdomen, or pelvis.
3. Aortic Atherosclerosis (01WBE-[id number].W) and Emphysema ([id number].M).

## 2024-03-05 ENCOUNTER — Ambulatory Visit: Admission: RE | Admit: 2024-03-05 | Source: Ambulatory Visit

## 2024-03-05 ENCOUNTER — Inpatient Hospital Stay

## 2024-03-19 ENCOUNTER — Inpatient Hospital Stay: Admitting: Oncology

## 2024-03-26 ENCOUNTER — Encounter: Payer: Self-pay | Admitting: Oncology

## 2024-04-09 ENCOUNTER — Other Ambulatory Visit

## 2024-04-23 ENCOUNTER — Ambulatory Visit: Admitting: Oncology
